# Patient Record
Sex: Male | Born: 1940 | Race: White | Hispanic: No | Marital: Married | Smoking: Former smoker
Health system: Southern US, Community
[De-identification: ages and names within clinical notes are randomized; demographics above are authoritative.]

## PROBLEM LIST (undated history)

## (undated) DIAGNOSIS — I10 Essential (primary) hypertension: Secondary | ICD-10-CM

## (undated) DIAGNOSIS — I2510 Atherosclerotic heart disease of native coronary artery without angina pectoris: Secondary | ICD-10-CM

## (undated) DIAGNOSIS — A048 Other specified bacterial intestinal infections: Secondary | ICD-10-CM

## (undated) DIAGNOSIS — Z8719 Personal history of other diseases of the digestive system: Secondary | ICD-10-CM

## (undated) DIAGNOSIS — I4891 Unspecified atrial fibrillation: Secondary | ICD-10-CM

## (undated) DIAGNOSIS — N200 Calculus of kidney: Secondary | ICD-10-CM

## (undated) DIAGNOSIS — I9789 Other postprocedural complications and disorders of the circulatory system, not elsewhere classified: Secondary | ICD-10-CM

## (undated) DIAGNOSIS — E7800 Pure hypercholesterolemia, unspecified: Secondary | ICD-10-CM

## (undated) DIAGNOSIS — N183 Chronic kidney disease, stage 3 unspecified: Secondary | ICD-10-CM

## (undated) DIAGNOSIS — G510 Bell's palsy: Secondary | ICD-10-CM

## (undated) DIAGNOSIS — E782 Mixed hyperlipidemia: Secondary | ICD-10-CM

## (undated) DIAGNOSIS — Z9889 Other specified postprocedural states: Secondary | ICD-10-CM

## (undated) DIAGNOSIS — R112 Nausea with vomiting, unspecified: Secondary | ICD-10-CM

## (undated) DIAGNOSIS — Z87442 Personal history of urinary calculi: Secondary | ICD-10-CM

## (undated) HISTORY — DX: Other postprocedural complications and disorders of the circulatory system, not elsewhere classified: I97.89

## (undated) HISTORY — DX: Personal history of other diseases of the digestive system: Z87.19

## (undated) HISTORY — DX: Unspecified atrial fibrillation: I48.91

## (undated) HISTORY — DX: Atherosclerotic heart disease of native coronary artery without angina pectoris: I25.10

## (undated) HISTORY — DX: Mixed hyperlipidemia: E78.2

## (undated) HISTORY — DX: Bell's palsy: G51.0

## (undated) HISTORY — PX: APPENDECTOMY: SHX54

## (undated) HISTORY — DX: Other specified bacterial intestinal infections: A04.8

## (undated) HISTORY — DX: Calculus of kidney: N20.0

## (undated) HISTORY — DX: Chronic kidney disease, stage 3 unspecified: N18.30

## (undated) HISTORY — DX: Essential (primary) hypertension: I10

---
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
## 2012-01-08 ENCOUNTER — Encounter (INDEPENDENT_AMBULATORY_CARE_PROVIDER_SITE_OTHER): Payer: Self-pay

## 2012-01-08 ENCOUNTER — Encounter (INDEPENDENT_AMBULATORY_CARE_PROVIDER_SITE_OTHER): Payer: Self-pay | Admitting: *Deleted

## 2012-02-19 ENCOUNTER — Telehealth (INDEPENDENT_AMBULATORY_CARE_PROVIDER_SITE_OTHER): Payer: Self-pay | Admitting: *Deleted

## 2012-02-19 ENCOUNTER — Other Ambulatory Visit (INDEPENDENT_AMBULATORY_CARE_PROVIDER_SITE_OTHER): Payer: Self-pay | Admitting: *Deleted

## 2012-02-19 DIAGNOSIS — Z86010 Personal history of colonic polyps: Secondary | ICD-10-CM

## 2012-02-19 DIAGNOSIS — Z1211 Encounter for screening for malignant neoplasm of colon: Secondary | ICD-10-CM

## 2012-02-19 NOTE — Telephone Encounter (Signed)
Patient needs movi prep 

## 2012-02-21 MED ORDER — PEG-KCL-NACL-NASULF-NA ASC-C 100 G PO SOLR: 1.0000 | Freq: Once | ORAL | Status: DC

## 2012-04-08 ENCOUNTER — Telehealth (INDEPENDENT_AMBULATORY_CARE_PROVIDER_SITE_OTHER): Payer: Self-pay | Admitting: *Deleted

## 2012-04-08 NOTE — Telephone Encounter (Signed)
PCP/Requesting MD: vyas  Name & DOB: Carl Young [birth year]   Procedure: tcs  Reason/Indication:  Hx polyps  Has patient had this procedure before?  yes  If so, when, by whom and where?  05/2007  Is there a family history of colon cancer?  no  Who?  What age when diagnosed?    Is patient diabetic?   no      Does patient have prosthetic heart valve?  no  Do you have a pacemaker?  no  Has patient had joint replacement within last 12 months?  no  Is patient on Coumadin, Plavix and/or Aspirin? yes  Medications: asa 81 mg daily, simvastatin 40 mg daily, benazepril 20/12.5 mg bid, gemfibrozil 300 mg bid  Allergies: nkda  Medication Adjustment: asa 2 days  Procedure date & time: 05/06/12 at 730

## 2012-04-09 NOTE — Telephone Encounter (Signed)
agree

## 2012-04-28 ENCOUNTER — Encounter (INDEPENDENT_AMBULATORY_CARE_PROVIDER_SITE_OTHER): Payer: Self-pay | Admitting: *Deleted

## 2012-04-28 MED ORDER — TETRACAINE HCL 0.5 % OP SOLN
OPHTHALMIC | Status: AC
  Filled 2012-04-28: qty 2

## 2012-04-28 MED ORDER — CYCLOPENTOLATE-PHENYLEPHRINE 0.2-1 % OP SOLN
OPHTHALMIC | Status: AC
  Filled 2012-04-28: qty 2

## 2012-04-29 MED ORDER — LIDOCAINE HCL (PF) 1 % IJ SOLN
INTRAMUSCULAR | Status: AC
  Filled 2012-04-29: qty 5

## 2012-04-29 MED ORDER — PROPOFOL 10 MG/ML IV EMUL
INTRAVENOUS | Status: AC
  Filled 2012-04-29: qty 20

## 2012-04-29 MED ORDER — FENTANYL CITRATE 0.05 MG/ML IJ SOLN
INTRAMUSCULAR | Status: AC
  Filled 2012-04-29: qty 5

## 2012-05-04 ENCOUNTER — Encounter (HOSPITAL_COMMUNITY): Payer: Self-pay | Admitting: Pharmacy Technician

## 2012-05-06 ENCOUNTER — Encounter (HOSPITAL_COMMUNITY): Payer: Self-pay | Admitting: *Deleted

## 2012-05-06 ENCOUNTER — Encounter (HOSPITAL_COMMUNITY)
Admission: RE | Disposition: A | Discharge status: Home / Self Care | Payer: Self-pay | Source: Ambulatory Visit | Attending: Internal Medicine

## 2012-05-06 ENCOUNTER — Ambulatory Visit (HOSPITAL_COMMUNITY)
Admission: RE | Admit: 2012-05-06 | Discharge: 2012-05-06 | Disposition: A | Discharge status: Home / Self Care | Payer: Medicare Other | Source: Ambulatory Visit | Attending: Internal Medicine | Admitting: Internal Medicine

## 2012-05-06 DIAGNOSIS — Z860100 Personal history of colon polyps, unspecified: Secondary | ICD-10-CM

## 2012-05-06 DIAGNOSIS — D126 Benign neoplasm of colon, unspecified: Secondary | ICD-10-CM | POA: Insufficient documentation

## 2012-05-06 DIAGNOSIS — K5730 Diverticulosis of large intestine without perforation or abscess without bleeding: Secondary | ICD-10-CM

## 2012-05-06 HISTORY — PX: COLONOSCOPY: SHX5424

## 2012-05-06 SURGERY — COLONOSCOPY
Anesthesia: Moderate Sedation

## 2012-05-06 MED ORDER — MEPERIDINE HCL 50 MG/ML IJ SOLN
INTRAMUSCULAR | Status: AC
  Filled 2012-05-06: qty 1

## 2012-05-06 MED ORDER — MEPERIDINE HCL 50 MG/ML IJ SOLN
INTRAMUSCULAR | Status: DC | PRN
  Administered 2012-05-06 (×2): 25 mg via INTRAVENOUS

## 2012-05-06 MED ORDER — MIDAZOLAM HCL 5 MG/5ML IJ SOLN
INTRAMUSCULAR | Status: DC | PRN
  Administered 2012-05-06: 2 mg via INTRAVENOUS
  Administered 2012-05-06: 1 mg via INTRAVENOUS

## 2012-05-06 MED ORDER — STERILE WATER FOR IRRIGATION IR SOLN
Status: DC | PRN
  Administered 2012-05-06: 08:00:00

## 2012-05-06 MED ORDER — MIDAZOLAM HCL 5 MG/5ML IJ SOLN
INTRAMUSCULAR | Status: AC
  Filled 2012-05-06: qty 10

## 2012-05-06 MED ORDER — SODIUM CHLORIDE 0.45 % IV SOLN
INTRAVENOUS | Status: DC
  Administered 2012-05-06: 08:00:00 via INTRAVENOUS

## 2012-05-06 NOTE — H&P (Signed)
Carl Young is an 71 y.o. male.   Chief Complaint: Patient is here for colonoscopy. HPI: Patient is 71 year old Caucasian male with history of colonic adenomas who is in for surveillance colonoscopy. His last exam was in November 2000 and removal of 2 small tubular adenomas. He denies abdominal pain change in his bowel habits or rectal bleeding. Family history is negative for colorectal carcinoma.  History reviewed. No pertinent past medical history.  Past Surgical History  Procedure Date  . Appendectomy     History reviewed. No pertinent family history. Social History:  does not have a smoking history on file. He does not have any smokeless tobacco history on file. He reports that he drinks alcohol. He reports that he does not use illicit drugs.  Allergies: No Known Allergies  Medications Prior to Admission  Medication Sig Dispense Refill  . aspirin EC 81 MG tablet Take 81 mg by mouth daily.      . benazepril-hydrochlorthiazide (LOTENSIN HCT) 20-12.5 MG per tablet Take 1 tablet by mouth 2 (two) times daily.      . Coenzyme Q10 (CO Q 10) 100 MG CAPS Take 1 tablet by mouth daily.      . fish oil-omega-3 fatty acids 1000 MG capsule Take 1 g by mouth 2 (two) times daily.      . folic acid (FOLVITE) 800 MCG tablet Take 800 mcg by mouth daily.      . Garlic 1000 MG CAPS Take 1,000 mg by mouth daily.      Marland Kitchen gemfibrozil (LOPID) 600 MG tablet Take 600 mg by mouth 2 (two) times daily.       Marland Kitchen Lysine 500 MG CAPS Take 500 mg by mouth daily.      . Multiple Vitamin (MULTIVITAMIN WITH MINERALS) TABS Take 1 tablet by mouth daily.      . peg 3350 powder (MOVIPREP) 100 G SOLR Take 1 kit (100 g total) by mouth once.  1 kit  0  . simvastatin (ZOCOR) 40 MG tablet Take 40 mg by mouth every evening.      . zinc gluconate 50 MG tablet Take 50 mg by mouth daily.      . niacin (NIASPAN) 500 MG CR tablet Take 500 mg by mouth at bedtime.        No results found for this or any previous visit (from the  past 48 hour(s)). No results found.  ROS  Blood pressure 154/89, pulse 68, temperature 98.4 F (36.9 C), temperature source Oral, resp. rate 17, height 6' (1.829 m), weight 214 lb (97.07 kg), SpO2 92.00%. Physical Exam  Constitutional: He appears well-developed and well-nourished.  HENT:  Mouth/Throat: Oropharynx is clear and moist.  Eyes: Conjunctivae normal are normal. No scleral icterus.  Neck: No thyromegaly present.  Cardiovascular: Normal rate, regular rhythm and normal heart sounds.   No murmur heard. Respiratory: Effort normal and breath sounds normal.  GI: Soft. He exhibits no distension and no mass. There is no tenderness.       Small umbilicus hernia which is completely reducible  Musculoskeletal: He exhibits no edema.  Lymphadenopathy:    He has no cervical adenopathy.  Neurological: He is alert.  Skin: Skin is warm and dry.     Assessment/Plan History of colonic adenomas. Surveillance colonoscopy.  Carl Young U 05/06/2012, 7:39 AM

## 2012-05-06 NOTE — Op Note (Signed)
COLONOSCOPY PROCEDURE REPORT  PATIENT:  Carl Young  MR#:  [id number] Birthdate:  1941-06-24, 71 y.o., male Endoscopist:  Dr. Malissa Hippo, MD Referred By:  Dub Mikes, MD Procedure Date: 05/06/2012  Procedure:   Colonoscopy  Indications:  Patient is 71 year old Caucasian male with history of colonic adenomas who is undergoing surveillance colonoscopy.  Informed Consent:  The procedure and risks were reviewed with the patient and informed consent was obtained.  Medications:  Demerol 50 mg IV Versed 3 mg IV  Description of procedure:  After a digital rectal exam was performed, that colonoscope was advanced from the anus through the rectum and colon to the area of the cecum, ileocecal valve and appendiceal orifice. The cecum was deeply intubated. These structures were well-seen and photographed for the record. From the level of the cecum and ileocecal valve, the scope was slowly and cautiously withdrawn. The mucosal surfaces were carefully surveyed utilizing scope tip to flexion to facilitate fold flattening as needed. The scope was pulled down into the rectum where a thorough exam including retroflexion was performed.  Findings:   Prep excellent. Scattered diverticula at sigmoid colon. Three small polyps at sigmoid colon ablated via cold biopsy and submitted together. Normal rectal mucosa and anal rectal junction.  Therapeutic/Diagnostic Maneuvers Performed:  The above  Complications:  None  Cecal Withdrawal Time:  17 minutes  Impression:  Examination performed to cecum. Mild to moderate sigmoid diverticulosis. Three small polyps ablated via cold biopsy from sigmoid colon and submitted together.  Recommendations:  Standard instructions given. I would be contacting patient with biopsy results and further recommendations.  Neriah Brott U  05/06/2012 8:10 AM  CC: Dr. Ignatius Specking., MD & Dr. Bonnetta Barry ref. provider found

## 2012-05-11 ENCOUNTER — Encounter (HOSPITAL_COMMUNITY): Payer: Self-pay | Admitting: Internal Medicine

## 2012-05-12 ENCOUNTER — Encounter (INDEPENDENT_AMBULATORY_CARE_PROVIDER_SITE_OTHER): Payer: Self-pay | Admitting: *Deleted

## 2015-03-27 ENCOUNTER — Emergency Department (HOSPITAL_COMMUNITY): Payer: Medicare Other

## 2015-03-27 ENCOUNTER — Emergency Department (HOSPITAL_COMMUNITY)
Admission: EM | Admit: 2015-03-27 | Discharge: 2015-03-27 | Disposition: A | Discharge status: Home / Self Care | Payer: Medicare Other | Attending: Emergency Medicine | Admitting: Emergency Medicine

## 2015-03-27 ENCOUNTER — Telehealth: Payer: Self-pay | Admitting: Internal Medicine

## 2015-03-27 ENCOUNTER — Encounter (HOSPITAL_COMMUNITY): Payer: Self-pay | Admitting: Emergency Medicine

## 2015-03-27 DIAGNOSIS — I10 Essential (primary) hypertension: Secondary | ICD-10-CM | POA: Diagnosis not present

## 2015-03-27 DIAGNOSIS — Z79899 Other long term (current) drug therapy: Secondary | ICD-10-CM | POA: Insufficient documentation

## 2015-03-27 DIAGNOSIS — K625 Hemorrhage of anus and rectum: Secondary | ICD-10-CM | POA: Diagnosis present

## 2015-03-27 DIAGNOSIS — K529 Noninfective gastroenteritis and colitis, unspecified: Secondary | ICD-10-CM | POA: Insufficient documentation

## 2015-03-27 DIAGNOSIS — Z87891 Personal history of nicotine dependence: Secondary | ICD-10-CM | POA: Insufficient documentation

## 2015-03-27 DIAGNOSIS — E780 Pure hypercholesterolemia: Secondary | ICD-10-CM | POA: Insufficient documentation

## 2015-03-27 DIAGNOSIS — Z7982 Long term (current) use of aspirin: Secondary | ICD-10-CM | POA: Insufficient documentation

## 2015-03-27 HISTORY — DX: Essential (primary) hypertension: I10

## 2015-03-27 HISTORY — DX: Pure hypercholesterolemia, unspecified: E78.00

## 2015-03-27 LAB — CBC
HEMATOCRIT: 44.3 % (ref 39.0–52.0)
Hemoglobin: 15.6 g/dL (ref 13.0–17.0)
MCH: 33.1 pg (ref 26.0–34.0)
MCHC: 35.2 g/dL (ref 30.0–36.0)
MCV: 94.1 fL (ref 78.0–100.0)
Platelets: 165 10*3/uL (ref 150–400)
RBC: 4.71 MIL/uL (ref 4.22–5.81)
RDW: 14.4 % (ref 11.5–15.5)
WBC: 14.8 10*3/uL — AB (ref 4.0–10.5)

## 2015-03-27 LAB — COMPREHENSIVE METABOLIC PANEL
ALT: 23 U/L (ref 17–63)
AST: 24 U/L (ref 15–41)
Albumin: 4.9 g/dL (ref 3.5–5.0)
Alkaline Phosphatase: 46 U/L (ref 38–126)
Anion gap: 10 (ref 5–15)
BUN: 24 mg/dL — ABNORMAL HIGH (ref 6–20)
CHLORIDE: 100 mmol/L — AB (ref 101–111)
CO2: 28 mmol/L (ref 22–32)
Calcium: 9.5 mg/dL (ref 8.9–10.3)
Creatinine, Ser: 1.24 mg/dL (ref 0.61–1.24)
GFR, EST NON AFRICAN AMERICAN: 55 mL/min — AB (ref >60)
Glucose, Bld: 105 mg/dL — ABNORMAL HIGH (ref 65–99)
POTASSIUM: 3.7 mmol/L (ref 3.5–5.1)
SODIUM: 138 mmol/L (ref 135–145)
Total Bilirubin: 0.8 mg/dL (ref 0.3–1.2)
Total Protein: 8.6 g/dL — ABNORMAL HIGH (ref 6.5–8.1)

## 2015-03-27 LAB — URINALYSIS, ROUTINE W REFLEX MICROSCOPIC
Bilirubin Urine: NEGATIVE
Glucose, UA: NEGATIVE mg/dL
Ketones, ur: NEGATIVE mg/dL
LEUKOCYTES UA: NEGATIVE
NITRITE: NEGATIVE
PROTEIN: NEGATIVE mg/dL
Specific Gravity, Urine: 1.015 (ref 1.005–1.030)
UROBILINOGEN UA: 0.2 mg/dL (ref 0.0–1.0)
pH: 6 (ref 5.0–8.0)

## 2015-03-27 LAB — URINE MICROSCOPIC-ADD ON

## 2015-03-27 LAB — POC OCCULT BLOOD, ED: Fecal Occult Bld: POSITIVE — AB

## 2015-03-27 LAB — LIPASE, BLOOD: Lipase: 42 U/L (ref 22–51)

## 2015-03-27 LAB — I-STAT CG4 LACTIC ACID, ED: LACTIC ACID, VENOUS: 1.13 mmol/L (ref 0.5–2.0)

## 2015-03-27 MED ORDER — CIPROFLOXACIN IN D5W 400 MG/200ML IV SOLN
400.0000 mg | Freq: Once | INTRAVENOUS | Status: AC
  Administered 2015-03-27: 400 mg via INTRAVENOUS
  Filled 2015-03-27: qty 200

## 2015-03-27 MED ORDER — METRONIDAZOLE 500 MG PO TABS: 500.0000 mg | ORAL_TABLET | Freq: Two times a day (BID) | ORAL | Status: DC

## 2015-03-27 MED ORDER — METRONIDAZOLE IN NACL 5-0.79 MG/ML-% IV SOLN
500.0000 mg | Freq: Once | INTRAVENOUS | Status: AC
  Administered 2015-03-27: 500 mg via INTRAVENOUS
  Filled 2015-03-27: qty 100

## 2015-03-27 MED ORDER — CIPROFLOXACIN HCL 500 MG PO TABS: 500.0000 mg | ORAL_TABLET | Freq: Two times a day (BID) | ORAL | Status: DC

## 2015-03-27 MED ORDER — IOHEXOL 300 MG/ML  SOLN
25.0000 mL | Freq: Once | INTRAMUSCULAR | Status: AC | PRN
  Administered 2015-03-27: 25 mL via ORAL

## 2015-03-27 MED ORDER — SODIUM CHLORIDE 0.9 % IV BOLUS (SEPSIS)
1000.0000 mL | Freq: Once | INTRAVENOUS | Status: AC
  Administered 2015-03-27: 1000 mL via INTRAVENOUS

## 2015-03-27 MED ORDER — IOHEXOL 300 MG/ML  SOLN
100.0000 mL | Freq: Once | INTRAMUSCULAR | Status: AC | PRN
  Administered 2015-03-27: 100 mL via INTRAVENOUS

## 2015-03-27 MED ORDER — AMOXICILLIN-POT CLAVULANATE 875-125 MG PO TABS: 1.0000 | ORAL_TABLET | Freq: Two times a day (BID) | ORAL | Status: DC

## 2015-03-27 NOTE — ED Provider Notes (Signed)
CSN: [id number]     Arrival date & time 03/27/15  1032 History  This chart was scribed for Ezequiel Essex, MD by Eustaquio Maize, ED Scribe. This patient was seen in room APA07/[facility]07 and the patient's care was started at 10:48 AM.   Chief Complaint  Patient presents with  . Rectal Bleeding   The history is provided by the patient. No language interpreter was used.     HPI Comments: Carl Young is a 74 y.o. male who presents to the Emergency Department complaining of rectal bleeding that began this morning. Pt reports having intermittent, cramping abdominal pain with mild diarrhea 3 days ago. Pt had 2 episodes of diarrhea 2 days ago. He mentions upon having a bowel movement this morning he noticed blood in the water, blood upon wiping, and pinkish color to his stools. Pt went to see his PCP, Dr. Woody Seller, this morning and was sent here for further evaluation. Pt denies having anymore abdominal pain but states it is still sore. He also complains of urinary frequency. Pt has never had symptoms like this in the past. He takes 81 mg aspirin every morning but pt is not on anticoagulants. Denies vomiting, fever, chest pain, shortness of breath, dizziness, lightheadedness, or any other associated symptoms. Pt has been eating and drinking normally. No hx hemorrhoids. PSHx appendectomy. Pt's last colonoscopy was 3 years ago with findings of colon polyps.   Past Medical History  Diagnosis Date  . Hypertension   . Hypercholesteremia    Past Surgical History  Procedure Laterality Date  . Appendectomy    . Colonoscopy  05/06/2012    Procedure: COLONOSCOPY;  Surgeon: Rogene Houston, MD;  Location: [facility];  Service: Endoscopy;  Laterality: N/A;  730   History reviewed. No pertinent family history. Social History  Substance Use Topics  . Smoking status: Former Research scientist (life sciences)  . Smokeless tobacco: None  . Alcohol Use: Yes     Comment: occas    Review of Systems  A complete 10 system review of systems  was obtained and all systems are negative except as noted in the HPI and PMH.    Allergies  Ciprofloxacin in d5w  Home Medications   Prior to Admission medications   Medication Sig Start Date End Date Taking? Authorizing Provider  aspirin EC 81 MG tablet Take 81 mg by mouth daily.   Yes Historical Provider, MD  benazepril-hydrochlorthiazide (LOTENSIN HCT) 20-12.5 MG per tablet Take 1 tablet by mouth 2 (two) times daily.   Yes Historical Provider, MD  fish oil-omega-3 fatty acids 1000 MG capsule Take 1 g by mouth 2 (two) times daily.   Yes Historical Provider, MD  folic acid (FOLVITE) 201 MCG tablet Take 800 mcg by mouth daily.   Yes Historical Provider, MD  Garlic 0071 MG CAPS Take 1,000 mg by mouth daily.   Yes Historical Provider, MD  gemfibrozil (LOPID) 600 MG tablet Take 600 mg by mouth 2 (two) times daily.    Yes Historical Provider, MD  Lysine 500 MG CAPS Take 500 mg by mouth daily.   Yes Historical Provider, MD  Multiple Vitamin (MULTIVITAMIN WITH MINERALS) TABS Take 1 tablet by mouth daily.   Yes Historical Provider, MD  niacin (NIASPAN) 500 MG CR tablet Take 500 mg by mouth at bedtime.   Yes Historical Provider, MD  simvastatin (ZOCOR) 10 MG tablet Take 10 mg by mouth daily.   Yes Historical Provider, MD  zinc gluconate 50 MG tablet Take 50 mg by  mouth daily.   Yes Historical Provider, MD  amoxicillin-clavulanate (AUGMENTIN) 875-125 MG per tablet Take 1 tablet by mouth every 12 (twelve) hours. 03/27/15   Ezequiel Essex, MD   Triage Vitals: BP 141/86 mmHg  Pulse 86  Temp(Src) 98.4 F (36.9 C) (Oral)  Resp 18  Ht 5\' 11"  (1.803 m)  Wt 195 lb (88.451 kg)  BMI 27.21 kg/m2  SpO2 97%   Physical Exam  Constitutional: He is oriented to person, place, and time. He appears well-developed and well-nourished. No distress.  HENT:  Head: Normocephalic and atraumatic.  Mouth/Throat: Oropharynx is clear and moist. No oropharyngeal exudate.  Eyes: Conjunctivae and EOM are normal. Pupils  are equal, round, and reactive to light.  Neck: Normal range of motion. Neck supple.  No meningismus.  Cardiovascular: Normal rate, regular rhythm, normal heart sounds and intact distal pulses.   No murmur heard. Pulmonary/Chest: Effort normal and breath sounds normal. No respiratory distress.  Abdominal: Soft. There is tenderness. There is no rebound and no guarding.  Mild LLQ tenderness  Genitourinary:  No hemorrhoids. No gross blood. No stool obtained.   Musculoskeletal: Normal range of motion. He exhibits no edema or tenderness.  Neurological: He is alert and oriented to person, place, and time. No cranial nerve deficit. He exhibits normal muscle tone. Coordination normal.  No ataxia on finger to nose bilaterally. No pronator drift. 5/5 strength throughout. CN 2-12 intact. Negative Romberg. Equal grip strength. Sensation intact. Gait is normal.   Skin: Skin is warm.  Psychiatric: He has a normal mood and affect. His behavior is normal.  Nursing note and vitals reviewed.   ED Course  Procedures (including critical care time)  DIAGNOSTIC STUDIES: Oxygen Saturation is 97% on RA, normal by my interpretation.    COORDINATION OF CARE: 10:56 AM-Discussed treatment plan which includes CT A/P, UA, Lipase, Occult blood, CMP, CBC with pt at bedside and pt agreed to plan.   Labs Review Labs Reviewed  COMPREHENSIVE METABOLIC PANEL - Abnormal; Notable for the following:    Chloride 100 (*)    Glucose, Bld 105 (*)    BUN 24 (*)    Total Protein 8.6 (*)    GFR calc non Af Amer 55 (*)    All other components within normal limits  CBC - Abnormal; Notable for the following:    WBC 14.8 (*)    All other components within normal limits  URINALYSIS, ROUTINE W REFLEX MICROSCOPIC (NOT AT [facility]) - Abnormal; Notable for the following:    Hgb urine dipstick SMALL (*)    All other components within normal limits  URINE MICROSCOPIC-ADD ON - Abnormal; Notable for the following:    Bacteria, UA FEW  (*)    All other components within normal limits  POC OCCULT BLOOD, ED - Abnormal; Notable for the following:    Fecal Occult Bld POSITIVE (*)    All other components within normal limits  LIPASE, BLOOD  I-STAT CG4 LACTIC ACID, ED    Imaging Review Ct Abdomen Pelvis W Contrast  03/27/2015   CLINICAL DATA:  Acute generalized abdominal pain, diarrhea, rectal bleeding.  EXAM: CT ABDOMEN AND PELVIS WITH CONTRAST  TECHNIQUE: Multidetector CT imaging of the abdomen and pelvis was performed using the standard protocol following bolus administration of intravenous contrast.  CONTRAST:  36mL OMNIPAQUE IOHEXOL 300 MG/ML SOLN, 120mL OMNIPAQUE IOHEXOL 300 MG/ML SOLN  COMPARISON:  None.  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 Visualized lung bases appear  normal.  No gallstones are noted. The liver, spleen and pancreas appear unremarkable. Adrenal glands appear normal. No hydronephrosis or renal obstruction is noted. Left renal cyst is noted. There is no evidence of bowel obstruction. Atherosclerosis of abdominal aorta is noted without aneurysm formation. Mild wall thickening with minimal surrounding inflammatory changes are seen involving the proximal descending colon. Sigmoid diverticulosis is noted without inflammation. Urinary bladder appears normal. No significant adenopathy is noted. Status post appendectomy.  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  Sigmoid diverticulosis is noted without inflammation.  Probable mild colitis seen involving the proximal descending colon. This most likely is inflammatory or infectious in origin.   Electronically Signed   By: Marijo Conception, M.D.   On: 03/27/2015 12:29   I have personally reviewed and evaluated these images and lab results as part of my medical decision-making.   EKG Interpretation None      MDM   Final diagnoses:  Colitis  Rectal bleeding   patient with lower abdominal  pain, diarrhea with rectal bleeding today. No fever. While stable. No distress.  Orthostatics positive by heart rate, IV fluids given. Hemoglobin is stable. CT with sigmoid colitis and diverticulosis.  Appears comfortable, lactate normal. Doubt ischemic colitis.  Discussed with Dr. Gala Romney. He agrees patient appears stable for outpatient treatment. We'll prescribe Cipro and Flagyl follow-up in the GI clinic tomorrow.  Results discussed with patient. He wishes to go home. Admission offered for IV fluids and antibiotics which he declines. He appears quite well and nontoxic. He is given IV fluids for his positive orthostasis. No further diarrhea in the ED.  Dr. Gala Romney states he will arrange patient follow-up in the GI office tomorrow.  Patient developed erythema near his IV site with itching during Cipro infusion. IV still patent. Cipro stopped and added medication allergy list. Antibiotic prescription switched to Augmentin.  Blood pressure 134/81, pulse 70, temperature 97.8 F (36.6 C), temperature source Oral, resp. rate 13, height 5\' 11"  (1.803 m), weight 195 lb (88.451 kg), SpO2 97 %.   I personally performed the services described in this documentation, which was scribed in my presence. The recorded information has been reviewed and is accurate.     Ezequiel Essex, MD 03/27/15 [phone]

## 2015-03-27 NOTE — Discharge Instructions (Signed)
Colitis Follow up with Dr. Laural Golden tomorrow. Call his office if he doesn't call you. Return to the ED if you develop worsening pain, rectal bleeding, vomiting, dizziness or any other concerns. Colitis is inflammation of the colon. Colitis can be a short-term or long-standing (chronic) illness. Crohn's disease and ulcerative colitis are 2 types of colitis which are chronic. They usually require lifelong treatment. CAUSES  There are many different causes of colitis, including:  Viruses.  Germs (bacteria).  Medicine reactions. SYMPTOMS   Diarrhea.  Intestinal bleeding.  Pain.  Fever.  Throwing up (vomiting).  Tiredness (fatigue).  Weight loss.  Bowel blockage. DIAGNOSIS  The diagnosis of colitis is based on examination and stool or blood tests. X-rays, CT scan, and colonoscopy may also be needed. TREATMENT  Treatment may include:  Fluids given through the vein (intravenously).  Bowel rest (nothing to eat or drink for a period of time).  Medicine for pain and diarrhea.  Medicines (antibiotics) that kill germs.  Cortisone medicines.  Surgery. HOME CARE INSTRUCTIONS   Get plenty of rest.  Drink enough water and fluids to keep your urine clear or pale yellow.  Eat a well-balanced diet.  Call your caregiver for follow-up as recommended. SEEK IMMEDIATE MEDICAL CARE IF:   You develop chills.  You have an oral temperature above 102 F (38.9 C), not controlled by medicine.  You have extreme weakness, fainting, or dehydration.  You have repeated vomiting.  You develop severe belly (abdominal) pain or are passing bloody or tarry stools. MAKE SURE YOU:   Understand these instructions.  Will watch your condition.  Will get help right away if you are not doing well or get worse. Document Released: 07/25/2004 Document Revised: 09/09/2011 Document Reviewed: 10/20/2009 [facility] Patient Information 2015 [place], [place]. This information is not intended to replace  advice given to you by your health care provider. Make sure you discuss any questions you have with your health care provider.

## 2015-03-27 NOTE — Telephone Encounter (Signed)
Patient of Dr. Salley Slaughter; seen in ED today. Dr. Kingsley Callander called me. Abdominal pain and diarrhea. Left-sided colitis on CT. White count almost 15,000. Looks good for outpatient management according to Dr. Kingsley Callander.  It has been requested that  Dr. Salley Slaughter see him in the next 24-48 hours. Appointment with him needs to be made. Thanks.

## 2015-03-27 NOTE — ED Notes (Signed)
Occult blood negative

## 2015-03-27 NOTE — ED Notes (Signed)
Cipro stopped at this time, pt has redness and swelling proximal to IV site, pt c/o itching and burning.  Dr. Wyvonnia Dusky notified.

## 2015-03-27 NOTE — ED Notes (Signed)
PT stated lower abdominal cramping x2 days and denies any n/v/d. PT noticed dark and bright red blood with BM this am. PT sent to ED by PCP Dr. Woody Seller.

## 2015-03-28 ENCOUNTER — Ambulatory Visit (INDEPENDENT_AMBULATORY_CARE_PROVIDER_SITE_OTHER): Payer: Medicare Other | Admitting: Internal Medicine

## 2015-03-28 ENCOUNTER — Encounter (INDEPENDENT_AMBULATORY_CARE_PROVIDER_SITE_OTHER): Payer: Self-pay | Admitting: Internal Medicine

## 2015-03-28 VITALS — BP 106/70 | HR 72 | Temp 98.7°F | Ht 71.0 in | Wt 191.2 lb

## 2015-03-28 DIAGNOSIS — E7800 Pure hypercholesterolemia, unspecified: Secondary | ICD-10-CM

## 2015-03-28 DIAGNOSIS — I10 Essential (primary) hypertension: Secondary | ICD-10-CM

## 2015-03-28 DIAGNOSIS — Z86010 Personal history of colonic polyps: Secondary | ICD-10-CM

## 2015-03-28 DIAGNOSIS — K529 Noninfective gastroenteritis and colitis, unspecified: Secondary | ICD-10-CM

## 2015-03-28 DIAGNOSIS — R197 Diarrhea, unspecified: Secondary | ICD-10-CM

## 2015-03-28 NOTE — Progress Notes (Signed)
Subjective:    Patient ID: Carl Young, male    DOB: 02-Mar-1941, 74 y.o.   MRN: [id number]  HPI    Patient presents today with c/o that he was seen in the ED 03/27/2015 with abdominal pain, diarrhea, and rectal bleeding.  The rectal bleeding starting the morning he went to the ED. He also had abdominal cramping, and diarrhea x 3 days. He saw Dr. Woody Seller and was referred to the ED for further evaluation.  He underwent a CT which revealed probable mild colitis involving the proximal descending colon. Most likely is inflammatory or infectious in origin. There was no fever, nausea, or vomiting associated with his symptoms.  Talked with Dr. Gala Romney and patient could be managed on an oupt basis. He was started on Cipro and Flagyl in the ED. He apparently had reaction to the Cipro:(swelling, and redness to his vein). He was started on Augmentin. He tells me today he feels the same. He feels tired. He says he has not seen any blood since yesterday. His stools are loose.  He is eating jello, and egg sandwiches. Basically a bland diet.     03/27/2015 CT abdomen/pelvis with CM:  acute generalized abdominal pain, diarrhea, rectal bleeding.  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   Sigmoid diverticulosis is noted without inflammation.   Probable mild colitis seen involving the proximal descending colon. This most likely is inflammatory or infectious in origin.   05/06/2012   Colonoscopy  Indications:  Patient is 74 year old Caucasian male with history of colonic adenomas who is undergoing surveillance colonoscopy.  Impression:   Examination performed to cecum. Mild to moderate sigmoid diverticulosis. Three small polyps ablated via cold biopsy from sigmoid colon and submitted together. Biopsy:   3 small polyps were removed and they're hyperplastic. Family history is negative for CRC. He could wait 7 years prior to next exam unless he develops change in his  bowel habits or new symptoms  Report to PCP   CBC    Component Value Date/Time   WBC 14.8* 03/27/2015 1100   RBC 4.71 03/27/2015 1100   HGB 15.6 03/27/2015 1100   HCT 44.3 03/27/2015 1100   PLT 165 03/27/2015 1100   MCV 94.1 03/27/2015 1100   MCH 33.1 03/27/2015 1100   MCHC 35.2 03/27/2015 1100   RDW 14.4 03/27/2015 1100   Hepatic Function Panel     Component Value Date/Time   PROT 8.6* 03/27/2015 1100   ALBUMIN 4.9 03/27/2015 1100   AST 24 03/27/2015 1100   ALT 23 03/27/2015 1100   ALKPHOS 46 03/27/2015 1100   BILITOT 0.8 03/27/2015 1100   Urinalysis    Component Value Date/Time   COLORURINE YELLOW 03/27/2015 [place] 03/27/2015 1152   LABSPEC 1.015 03/27/2015 1152   PHURINE 6.0 03/27/2015 1152   GLUCOSEU NEGATIVE 03/27/2015 1152   HGBUR SMALL* 03/27/2015 [place] 03/27/2015 1152   KETONESUR NEGATIVE 03/27/2015 1152   PROTEINUR NEGATIVE 03/27/2015 1152   UROBILINOGEN 0.2 03/27/2015 1152   NITRITE NEGATIVE 03/27/2015 1152   LEUKOCYTESUR NEGATIVE 03/27/2015 1152         Review of Systems Past Medical History  Diagnosis Date  . Hypertension   . Hypercholesteremia     Past Surgical History  Procedure Laterality Date  . Appendectomy    . Colonoscopy  05/06/2012    Procedure: COLONOSCOPY;  Surgeon: Rogene Houston, MD;  Location: [facility];  Service: Endoscopy;  Laterality: N/A;  730    Allergies  Allergen Reactions  . Ciprofloxacin In D5w     Rash, pruritis     Current Outpatient Prescriptions on File Prior to Visit  Medication Sig Dispense Refill  . amoxicillin-clavulanate (AUGMENTIN) 875-125 MG per tablet Take 1 tablet by mouth every 12 (twelve) hours. 10 tablet 0  . aspirin EC 81 MG tablet Take 81 mg by mouth daily.    . benazepril-hydrochlorthiazide (LOTENSIN HCT) 20-12.5 MG per tablet Take 1 tablet by mouth 2 (two) times daily.    . fish oil-omega-3 fatty acids 1000 MG capsule Take 1 g by mouth 2 (two)  times daily.    . folic acid (FOLVITE) 488 MCG tablet Take 800 mcg by mouth daily.    . Garlic 8916 MG CAPS Take 1,000 mg by mouth daily.    Marland Kitchen Lysine 500 MG CAPS Take 500 mg by mouth daily.    . Multiple Vitamin (MULTIVITAMIN WITH MINERALS) TABS Take 1 tablet by mouth daily.    . niacin (NIASPAN) 500 MG CR tablet Take 500 mg by mouth at bedtime.    Marland Kitchen zinc gluconate 50 MG tablet Take 50 mg by mouth daily.     No current facility-administered medications on file prior to visit.        Objective:   Physical Exam Blood pressure 106/70, pulse 72, temperature 98.7 F (37.1 C), height 5\' 11"  (1.803 m), weight 191 lb 3.2 oz (86.728 kg).  Alert and oriented. Skin warm and dry. Oral mucosa is moist.   . Sclera anicteric, conjunctivae is pink. Thyroid not enlarged. No cervical lymphadenopathy. Lungs clear. Heart regular rate and rhythm.  Abdomen is soft. Bowel sounds are positive. No hepatomegaly. No abdominal masses felt. Slight  Tenderness to left abdomen. .  No edema to lower extremities.         Assessment & Plan:  ? Infectious colitis. He is taking Augmentin for this.  Am going to get GI Pathogen. If normal and he feels better, will watch. If not better he will need a sigmoidoscopy.

## 2015-03-28 NOTE — Patient Instructions (Addendum)
GI pathogen. Further recommendations to follow.  

## 2015-03-30 LAB — GASTROINTESTINAL PATHOGEN PANEL PCR
C. DIFFICILE TOX A/B, PCR: NEGATIVE
CAMPYLOBACTER, PCR: NEGATIVE
CRYPTOSPORIDIUM, PCR: NEGATIVE
E COLI 0157, PCR: NEGATIVE
E coli (ETEC) LT/ST PCR: NEGATIVE
E coli (STEC) stx1/stx2, PCR: NEGATIVE
Giardia lamblia, PCR: NEGATIVE
Norovirus, PCR: NEGATIVE
ROTAVIRUS, PCR: NEGATIVE
SALMONELLA, PCR: NEGATIVE
Shigella, PCR: NEGATIVE

## 2015-03-30 NOTE — Telephone Encounter (Signed)
Patient was seen in our office 2 days ago by Ms. Setzer NP.

## 2015-07-24 DIAGNOSIS — X32XXXD Exposure to sunlight, subsequent encounter: Secondary | ICD-10-CM | POA: Diagnosis not present

## 2015-07-24 DIAGNOSIS — L570 Actinic keratosis: Secondary | ICD-10-CM | POA: Diagnosis not present

## 2015-07-24 DIAGNOSIS — D235 Other benign neoplasm of skin of trunk: Secondary | ICD-10-CM | POA: Diagnosis not present

## 2015-07-24 DIAGNOSIS — D225 Melanocytic nevi of trunk: Secondary | ICD-10-CM | POA: Diagnosis not present

## 2015-07-24 DIAGNOSIS — D3617 Benign neoplasm of peripheral nerves and autonomic nervous system of trunk, unspecified: Secondary | ICD-10-CM | POA: Diagnosis not present

## 2015-07-24 DIAGNOSIS — D485 Neoplasm of uncertain behavior of skin: Secondary | ICD-10-CM | POA: Diagnosis not present

## 2015-07-24 DIAGNOSIS — L820 Inflamed seborrheic keratosis: Secondary | ICD-10-CM | POA: Diagnosis not present

## 2015-07-31 DIAGNOSIS — L089 Local infection of the skin and subcutaneous tissue, unspecified: Secondary | ICD-10-CM | POA: Diagnosis not present

## 2015-07-31 DIAGNOSIS — D485 Neoplasm of uncertain behavior of skin: Secondary | ICD-10-CM | POA: Diagnosis not present

## 2015-09-05 DIAGNOSIS — E782 Mixed hyperlipidemia: Secondary | ICD-10-CM | POA: Diagnosis not present

## 2015-09-05 DIAGNOSIS — Z713 Dietary counseling and surveillance: Secondary | ICD-10-CM | POA: Diagnosis not present

## 2015-09-05 DIAGNOSIS — K7689 Other specified diseases of liver: Secondary | ICD-10-CM | POA: Diagnosis not present

## 2015-09-05 DIAGNOSIS — Z789 Other specified health status: Secondary | ICD-10-CM | POA: Diagnosis not present

## 2015-09-05 DIAGNOSIS — Z6827 Body mass index (BMI) 27.0-27.9, adult: Secondary | ICD-10-CM | POA: Diagnosis not present

## 2015-10-27 DIAGNOSIS — E7800 Pure hypercholesterolemia, unspecified: Secondary | ICD-10-CM | POA: Diagnosis not present

## 2015-10-27 DIAGNOSIS — I10 Essential (primary) hypertension: Secondary | ICD-10-CM | POA: Diagnosis not present

## 2015-12-21 DIAGNOSIS — E7800 Pure hypercholesterolemia, unspecified: Secondary | ICD-10-CM | POA: Diagnosis not present

## 2015-12-21 DIAGNOSIS — I10 Essential (primary) hypertension: Secondary | ICD-10-CM | POA: Diagnosis not present

## 2016-01-09 DIAGNOSIS — Z299 Encounter for prophylactic measures, unspecified: Secondary | ICD-10-CM | POA: Diagnosis not present

## 2016-01-09 DIAGNOSIS — E782 Mixed hyperlipidemia: Secondary | ICD-10-CM | POA: Diagnosis not present

## 2016-01-09 DIAGNOSIS — I10 Essential (primary) hypertension: Secondary | ICD-10-CM | POA: Diagnosis not present

## 2016-01-29 DIAGNOSIS — L821 Other seborrheic keratosis: Secondary | ICD-10-CM | POA: Diagnosis not present

## 2016-01-29 DIAGNOSIS — Z1283 Encounter for screening for malignant neoplasm of skin: Secondary | ICD-10-CM | POA: Diagnosis not present

## 2016-02-23 DIAGNOSIS — E7800 Pure hypercholesterolemia, unspecified: Secondary | ICD-10-CM | POA: Diagnosis not present

## 2016-02-23 DIAGNOSIS — I10 Essential (primary) hypertension: Secondary | ICD-10-CM | POA: Diagnosis not present

## 2016-03-06 DIAGNOSIS — Z1211 Encounter for screening for malignant neoplasm of colon: Secondary | ICD-10-CM | POA: Diagnosis not present

## 2016-03-06 DIAGNOSIS — Z1389 Encounter for screening for other disorder: Secondary | ICD-10-CM | POA: Diagnosis not present

## 2016-03-06 DIAGNOSIS — Z79899 Other long term (current) drug therapy: Secondary | ICD-10-CM | POA: Diagnosis not present

## 2016-03-06 DIAGNOSIS — Z299 Encounter for prophylactic measures, unspecified: Secondary | ICD-10-CM | POA: Diagnosis not present

## 2016-03-06 DIAGNOSIS — Z7189 Other specified counseling: Secondary | ICD-10-CM | POA: Diagnosis not present

## 2016-03-06 DIAGNOSIS — E7800 Pure hypercholesterolemia, unspecified: Secondary | ICD-10-CM | POA: Diagnosis not present

## 2016-03-06 DIAGNOSIS — R5383 Other fatigue: Secondary | ICD-10-CM | POA: Diagnosis not present

## 2016-03-06 DIAGNOSIS — Z125 Encounter for screening for malignant neoplasm of prostate: Secondary | ICD-10-CM | POA: Diagnosis not present

## 2016-03-06 DIAGNOSIS — Z0000 Encounter for general adult medical examination without abnormal findings: Secondary | ICD-10-CM | POA: Diagnosis not present

## 2016-03-06 DIAGNOSIS — E0590 Thyrotoxicosis, unspecified without thyrotoxic crisis or storm: Secondary | ICD-10-CM | POA: Diagnosis not present

## 2016-03-25 DIAGNOSIS — I10 Essential (primary) hypertension: Secondary | ICD-10-CM | POA: Diagnosis not present

## 2016-03-25 DIAGNOSIS — E7800 Pure hypercholesterolemia, unspecified: Secondary | ICD-10-CM | POA: Diagnosis not present

## 2016-05-02 DIAGNOSIS — Z23 Encounter for immunization: Secondary | ICD-10-CM | POA: Diagnosis not present

## 2016-09-11 DIAGNOSIS — I10 Essential (primary) hypertension: Secondary | ICD-10-CM | POA: Diagnosis not present

## 2016-09-11 DIAGNOSIS — Z713 Dietary counseling and surveillance: Secondary | ICD-10-CM | POA: Diagnosis not present

## 2016-09-11 DIAGNOSIS — E782 Mixed hyperlipidemia: Secondary | ICD-10-CM | POA: Diagnosis not present

## 2016-09-11 DIAGNOSIS — Z6827 Body mass index (BMI) 27.0-27.9, adult: Secondary | ICD-10-CM | POA: Diagnosis not present

## 2016-09-11 DIAGNOSIS — K7689 Other specified diseases of liver: Secondary | ICD-10-CM | POA: Diagnosis not present

## 2016-09-11 DIAGNOSIS — Z299 Encounter for prophylactic measures, unspecified: Secondary | ICD-10-CM | POA: Diagnosis not present

## 2016-12-09 DIAGNOSIS — Z6827 Body mass index (BMI) 27.0-27.9, adult: Secondary | ICD-10-CM | POA: Diagnosis not present

## 2016-12-09 DIAGNOSIS — Z713 Dietary counseling and surveillance: Secondary | ICD-10-CM | POA: Diagnosis not present

## 2016-12-09 DIAGNOSIS — Z299 Encounter for prophylactic measures, unspecified: Secondary | ICD-10-CM | POA: Diagnosis not present

## 2016-12-09 DIAGNOSIS — E782 Mixed hyperlipidemia: Secondary | ICD-10-CM | POA: Diagnosis not present

## 2017-01-30 DIAGNOSIS — M2041 Other hammer toe(s) (acquired), right foot: Secondary | ICD-10-CM | POA: Diagnosis not present

## 2017-01-30 DIAGNOSIS — L97523 Non-pressure chronic ulcer of other part of left foot with necrosis of muscle: Secondary | ICD-10-CM | POA: Diagnosis not present

## 2017-01-30 DIAGNOSIS — L84 Corns and callosities: Secondary | ICD-10-CM | POA: Diagnosis not present

## 2017-01-30 DIAGNOSIS — M2042 Other hammer toe(s) (acquired), left foot: Secondary | ICD-10-CM | POA: Diagnosis not present

## 2017-02-06 DIAGNOSIS — L97523 Non-pressure chronic ulcer of other part of left foot with necrosis of muscle: Secondary | ICD-10-CM | POA: Diagnosis not present

## 2017-02-12 DIAGNOSIS — L97523 Non-pressure chronic ulcer of other part of left foot with necrosis of muscle: Secondary | ICD-10-CM | POA: Diagnosis not present

## 2017-02-12 DIAGNOSIS — G608 Other hereditary and idiopathic neuropathies: Secondary | ICD-10-CM | POA: Diagnosis not present

## 2017-02-12 DIAGNOSIS — L84 Corns and callosities: Secondary | ICD-10-CM | POA: Diagnosis not present

## 2017-02-21 DIAGNOSIS — M216X2 Other acquired deformities of left foot: Secondary | ICD-10-CM | POA: Diagnosis not present

## 2017-02-21 DIAGNOSIS — L97522 Non-pressure chronic ulcer of other part of left foot with fat layer exposed: Secondary | ICD-10-CM | POA: Diagnosis not present

## 2017-02-21 DIAGNOSIS — M2041 Other hammer toe(s) (acquired), right foot: Secondary | ICD-10-CM | POA: Diagnosis not present

## 2017-03-04 DIAGNOSIS — L97522 Non-pressure chronic ulcer of other part of left foot with fat layer exposed: Secondary | ICD-10-CM | POA: Diagnosis not present

## 2017-03-12 DIAGNOSIS — Z1389 Encounter for screening for other disorder: Secondary | ICD-10-CM | POA: Diagnosis not present

## 2017-03-12 DIAGNOSIS — Z7189 Other specified counseling: Secondary | ICD-10-CM | POA: Diagnosis not present

## 2017-03-12 DIAGNOSIS — Z125 Encounter for screening for malignant neoplasm of prostate: Secondary | ICD-10-CM | POA: Diagnosis not present

## 2017-03-12 DIAGNOSIS — E7800 Pure hypercholesterolemia, unspecified: Secondary | ICD-10-CM | POA: Diagnosis not present

## 2017-03-12 DIAGNOSIS — Z1211 Encounter for screening for malignant neoplasm of colon: Secondary | ICD-10-CM | POA: Diagnosis not present

## 2017-03-12 DIAGNOSIS — Z299 Encounter for prophylactic measures, unspecified: Secondary | ICD-10-CM | POA: Diagnosis not present

## 2017-03-12 DIAGNOSIS — R5383 Other fatigue: Secondary | ICD-10-CM | POA: Diagnosis not present

## 2017-03-12 DIAGNOSIS — Z6827 Body mass index (BMI) 27.0-27.9, adult: Secondary | ICD-10-CM | POA: Diagnosis not present

## 2017-03-12 DIAGNOSIS — E0590 Thyrotoxicosis, unspecified without thyrotoxic crisis or storm: Secondary | ICD-10-CM | POA: Diagnosis not present

## 2017-03-12 DIAGNOSIS — Z0000 Encounter for general adult medical examination without abnormal findings: Secondary | ICD-10-CM | POA: Diagnosis not present

## 2017-03-12 DIAGNOSIS — Z79899 Other long term (current) drug therapy: Secondary | ICD-10-CM | POA: Diagnosis not present

## 2017-03-18 DIAGNOSIS — L97522 Non-pressure chronic ulcer of other part of left foot with fat layer exposed: Secondary | ICD-10-CM | POA: Diagnosis not present

## 2017-03-20 DIAGNOSIS — H25013 Cortical age-related cataract, bilateral: Secondary | ICD-10-CM | POA: Diagnosis not present

## 2017-03-20 DIAGNOSIS — I10 Essential (primary) hypertension: Secondary | ICD-10-CM | POA: Diagnosis not present

## 2017-03-20 DIAGNOSIS — H35033 Hypertensive retinopathy, bilateral: Secondary | ICD-10-CM | POA: Diagnosis not present

## 2017-03-20 DIAGNOSIS — H35433 Paving stone degeneration of retina, bilateral: Secondary | ICD-10-CM | POA: Diagnosis not present

## 2017-03-20 DIAGNOSIS — Z87891 Personal history of nicotine dependence: Secondary | ICD-10-CM | POA: Diagnosis not present

## 2017-03-20 DIAGNOSIS — H3540 Unspecified peripheral retinal degeneration: Secondary | ICD-10-CM | POA: Diagnosis not present

## 2017-03-20 DIAGNOSIS — H532 Diplopia: Secondary | ICD-10-CM | POA: Diagnosis not present

## 2017-03-20 DIAGNOSIS — H25813 Combined forms of age-related cataract, bilateral: Secondary | ICD-10-CM | POA: Diagnosis not present

## 2017-03-20 DIAGNOSIS — H353112 Nonexudative age-related macular degeneration, right eye, intermediate dry stage: Secondary | ICD-10-CM | POA: Diagnosis not present

## 2017-03-20 DIAGNOSIS — Z299 Encounter for prophylactic measures, unspecified: Secondary | ICD-10-CM | POA: Diagnosis not present

## 2017-03-20 DIAGNOSIS — E782 Mixed hyperlipidemia: Secondary | ICD-10-CM | POA: Diagnosis not present

## 2017-03-20 DIAGNOSIS — H2513 Age-related nuclear cataract, bilateral: Secondary | ICD-10-CM | POA: Diagnosis not present

## 2017-03-20 DIAGNOSIS — E663 Overweight: Secondary | ICD-10-CM | POA: Diagnosis not present

## 2017-03-21 DIAGNOSIS — H4900 Third [oculomotor] nerve palsy, unspecified eye: Secondary | ICD-10-CM | POA: Diagnosis not present

## 2017-03-21 DIAGNOSIS — H353132 Nonexudative age-related macular degeneration, bilateral, intermediate dry stage: Secondary | ICD-10-CM | POA: Diagnosis not present

## 2017-03-21 DIAGNOSIS — H4901 Third [oculomotor] nerve palsy, right eye: Secondary | ICD-10-CM | POA: Diagnosis not present

## 2017-04-01 DIAGNOSIS — L97522 Non-pressure chronic ulcer of other part of left foot with fat layer exposed: Secondary | ICD-10-CM | POA: Diagnosis not present

## 2017-04-08 DIAGNOSIS — L97522 Non-pressure chronic ulcer of other part of left foot with fat layer exposed: Secondary | ICD-10-CM | POA: Diagnosis not present

## 2017-04-15 DIAGNOSIS — L97522 Non-pressure chronic ulcer of other part of left foot with fat layer exposed: Secondary | ICD-10-CM | POA: Diagnosis not present

## 2017-04-22 DIAGNOSIS — L97522 Non-pressure chronic ulcer of other part of left foot with fat layer exposed: Secondary | ICD-10-CM | POA: Diagnosis not present

## 2017-04-24 DIAGNOSIS — H4901 Third [oculomotor] nerve palsy, right eye: Secondary | ICD-10-CM | POA: Diagnosis not present

## 2017-04-24 DIAGNOSIS — I10 Essential (primary) hypertension: Secondary | ICD-10-CM | POA: Diagnosis not present

## 2017-04-24 DIAGNOSIS — E7800 Pure hypercholesterolemia, unspecified: Secondary | ICD-10-CM | POA: Diagnosis not present

## 2017-04-29 DIAGNOSIS — L97522 Non-pressure chronic ulcer of other part of left foot with fat layer exposed: Secondary | ICD-10-CM | POA: Diagnosis not present

## 2017-05-06 DIAGNOSIS — L97522 Non-pressure chronic ulcer of other part of left foot with fat layer exposed: Secondary | ICD-10-CM | POA: Diagnosis not present

## 2017-05-12 DIAGNOSIS — R748 Abnormal levels of other serum enzymes: Secondary | ICD-10-CM | POA: Diagnosis not present

## 2017-05-13 DIAGNOSIS — L97522 Non-pressure chronic ulcer of other part of left foot with fat layer exposed: Secondary | ICD-10-CM | POA: Diagnosis not present

## 2017-05-20 DIAGNOSIS — L97522 Non-pressure chronic ulcer of other part of left foot with fat layer exposed: Secondary | ICD-10-CM | POA: Diagnosis not present

## 2017-05-27 DIAGNOSIS — L97522 Non-pressure chronic ulcer of other part of left foot with fat layer exposed: Secondary | ICD-10-CM | POA: Diagnosis not present

## 2017-06-03 DIAGNOSIS — L97522 Non-pressure chronic ulcer of other part of left foot with fat layer exposed: Secondary | ICD-10-CM | POA: Diagnosis not present

## 2017-06-06 DIAGNOSIS — H4901 Third [oculomotor] nerve palsy, right eye: Secondary | ICD-10-CM | POA: Diagnosis not present

## 2017-06-12 DIAGNOSIS — L97522 Non-pressure chronic ulcer of other part of left foot with fat layer exposed: Secondary | ICD-10-CM | POA: Diagnosis not present

## 2017-06-20 DIAGNOSIS — M216X2 Other acquired deformities of left foot: Secondary | ICD-10-CM | POA: Diagnosis not present

## 2017-06-20 DIAGNOSIS — L97522 Non-pressure chronic ulcer of other part of left foot with fat layer exposed: Secondary | ICD-10-CM | POA: Diagnosis not present

## 2017-06-20 DIAGNOSIS — M2041 Other hammer toe(s) (acquired), right foot: Secondary | ICD-10-CM | POA: Diagnosis not present

## 2017-06-20 DIAGNOSIS — Z0189 Encounter for other specified special examinations: Secondary | ICD-10-CM | POA: Diagnosis not present

## 2017-06-27 ENCOUNTER — Other Ambulatory Visit: Payer: Self-pay | Admitting: Podiatry

## 2017-06-27 DIAGNOSIS — L97522 Non-pressure chronic ulcer of other part of left foot with fat layer exposed: Secondary | ICD-10-CM | POA: Diagnosis not present

## 2017-07-02 NOTE — Patient Instructions (Signed)
Carl Young  07/02/2017     @PREFPERIOPPHARMACY @   Your procedure is scheduled on  07/09/2017   Report to [facility] at  78   A.M.  Call this number if you have problems the morning of surgery:  [phone]   Remember:  Do not eat food or drink liquids after midnight.  Take these medicines the morning of surgery with A SIP OF WATER  Lotensin, claritin.   Do not wear jewelry, make-up or nail polish.  Do not wear lotions, powders, or perfumes, or deodorant.  Do not shave 48 hours prior to surgery.  Men may shave face and neck.  Do not bring valuables to the hospital.  [facility] is not responsible for any belongings or valuables.  Contacts, dentures or bridgework may not be worn into surgery.  Leave your suitcase in the car.  After surgery it may be brought to your room.  For patients admitted to the hospital, discharge time will be determined by your treatment team.  Patients discharged the day of surgery will not be allowed to drive home.   Name and phone number of your driver:   family Special instructions:  None  Please read over the following fact sheets that you were given. Anesthesia Post-op Instructions and Care and Recovery After Surgery       Metatarsal Osteotomy Metatarsal osteotomy is a surgical procedure to correct a toe dislocation or deformity. The surgery may also help to relieve foot pain. Your metatarsals are the five long bones that connect your toes to the rest of your foot. Osteotomy is a surgical cut into a bone to reshape and reposition the bone or joint. Tell a health care provider about:  Any allergies you have.  All medicines you are taking, including vitamins, herbs, eye drops, creams, and over-the-counter medicines.  Any problems you or family members have had with anesthetic medicines.  Any blood disorders you have.  Any surgeries you have had.  Any medical conditions you have. What are the risks? Generally,  this is a safe procedure. However, problems may occur, including:  Stiffness.  Pain.  Infection.  Bleeding.  Allergic reactions to medicines.  Nerve damage that causes numbness.  Failure of the osteotomy to heal.  A blood clot that forms in your leg and travels to your lungs (pulmonary embolism).  What happens before the procedure?  Your health care provider may order imaging tests of your foot.  Follow instructions from your health care provider about eating or drinking restrictions.  Ask your health care provider about: ? Changing or stopping your regular medicines. This is especially important if you are taking diabetes medicines or blood thinners. ? Taking medicines such as aspirin and ibuprofen. These medicines can thin your blood. Do not take these medicines before your procedure if your health care provider instructs you not to.  Plan to have someone take you home after the procedure.  Ask your health care provider how your surgical site will be marked or identified.  You may be given antibiotic medicine to help prevent infection. What happens during the procedure?  To reduce your risk of infection: ? Your health care team will wash or sanitize their hands. ? Your skin will be washed with soap. ? Hair may be removed from the surgical area.  An IV tube will be started in one of your veins.  You will be given one or more of the  following: ? A medicine to help you relax (sedative). ? A medicine to numb the area (local anesthetic). ? A medicine to make you fall asleep (general anesthetic). ? A medicine that is injected into your spine to numb the area below and slightly above the injection site (spinal anesthetic). ? A medicine that is injected into an area of your body to numb everything below the injection site (regional anesthetic).  A surgical cut (incision) will be made on your foot over the metatarsal bone that will be treated.  A cut will be made in the  bone to shorten or straighten the bone.  Metal pins, plates, or screws may be used to hold the bone in the right position.  The incision will be closed with stitches (sutures) or staples.  A bandage (dressing) will be placed around the front and bottom of your foot. The procedure may vary among health care providers and hospitals. What happens after the procedure?  Your blood pressure, heart rate, breathing rate, and blood oxygen level will be monitored often until the medicines you were given have worn off.  You may be given walking aids, such as: ? A custom-fitted hard-soled shoe that keeps weight on your heel. ? A walking boot. ? A splint. ? Crutches or a walker to help you walk without putting weight on your foot.  Do not drive for 24 hours if you received a sedative. This information is not intended to replace advice given to you by your health care provider. Make sure you discuss any questions you have with your health care provider. Document Released: 05/29/2015 Document Revised: 11/23/2015 Document Reviewed: 02/09/2015 Elsevier Interactive Patient Education  2018 [place].  Metatarsal Osteotomy, Care After Refer to this sheet in the next few weeks. These instructions provide you with information about caring for yourself after your procedure. Your health care provider may also give you more specific instructions. Your treatment has been planned according to current medical practices, but problems sometimes occur. Call your health care provider if you have any problems or questions after your procedure. What can I expect after the procedure? After the procedure, it is common to have:  Soreness.  Pain.  Stiffness.  Swelling.  Follow these instructions at home: If you have a splint:  Wear the splint as told by your health care provider. Remove it only as told by your health care provider.  Loosen the splint if your toes tingle, become numb, or turn cold and  blue.  Do not let your splint get wet if it is not waterproof.  Keep the splint clean. Bathing  Do not take baths, swim, or use a hot tub until your health care provider approves. Ask your health care provider if you can take showers. You may only be allowed to take sponge baths for bathing.  If your splint is not waterproof, cover it with a watertight plastic bag when you take a bath or a shower.  Keep the bandage (dressing) dry until your health care provider says it can be removed. Incision care  Follow instructions from your health care provider about how to take care of your cut from surgery (incision). Make sure you: ? Wash your hands with soap and water before you change your bandage (dressing). If soap and water are not available, use hand sanitizer. ? Change your dressing as told by your health care provider. ? Leave stitches (sutures), skin glue, or adhesive strips in place. These skin closures may need to stay in  place for 2 weeks or longer. If adhesive strip edges start to loosen and curl up, you may trim the loose edges. Do not remove adhesive strips completely unless your health care provider tells you to do that.  Check your incision area every day for signs of infection. Check for: ? More redness, swelling, or pain. ? More fluid or blood. ? Warmth. ? Pus or a bad smell. Managing pain, stiffness, and swelling   If directed, apply ice to the injured area. ? Put ice in a plastic bag. ? Place a towel between your skin and the bag. ? Leave the ice on for 20 minutes, 2-3 times a day.  Move your toes often to avoid stiffness and to lessen swelling.  Raise (elevate) the injured area above the level of your heart while you are sitting or lying down. Driving  Do not drive or operate heavy machinery while taking prescription pain medicine.  Do not drive for 24 hours if you received a sedative.  Ask your health care provider when it is safe to drive if you have a  dressing, splint, special shoe, or walking boot on your foot. General instructions  If you were given a splint, special shoe, or walking boot, wear it as told by your health care provider.  Return to your normal activities as told by your health care provider. Ask your health care provider what activities are safe for you.  Do not use the injured limb to support your body weight until your health care provider says that you can. Use crutches or a walker as told by your health care provider.  Do not use any tobacco products, such as cigarettes, chewing tobacco, and e-cigarettes. Tobacco can delay bone healing. If you need help quitting, ask your health care provider.  Take over-the-counter and prescription medicines only as told by your health care provider.  Keep all follow-up visits as told by your health care provider. This is important. Contact a health care provider if:  You have a fever.  Your dressing becomes wet, loose, or stained with blood or discharge.  You have pus or a bad smell coming from your incision or bandage.  Your foot becomes red, swollen, or tender.  You have pain or stiffness that does not get better or gets worse.  You have tingling or numbness in your foot that does not get better or gets worse. Get help right away if:  You develop a warm and tender swelling in your leg.  You have chest pain.  You have trouble breathing. This information is not intended to replace advice given to you by your health care provider. Make sure you discuss any questions you have with your health care provider. Document Released: 05/29/2015 Document Revised: 11/23/2015 Document Reviewed: 02/09/2015 Elsevier Interactive Patient Education  2018 [place] Toe Hammer toe is a change in the shape (a deformity) of your second, third, or fourth toe. The deformity causes the middle joint of your toe to stay bent. This causes pain, especially when you are wearing shoes.  Hammer toe starts gradually. At first, the toe can be straightened. Gradually over time, the deformity becomes stiff and permanent. Early treatments to keep the toe straight may relieve pain. As the deformity becomes stiff and permanent, surgery may be needed to straighten the toe. What are the causes? Hammer toe is caused by abnormal bending of the toe joint that is closest to your foot. It happens gradually over time. This pulls on  the muscles and connections (tendons) of the toe joint, making them weak and stiff. It is often related to wearing shoes that are too short or narrow and do not let your toes straighten. What increases the risk? You may be at greater risk for hammer toe if you:  Are male.  Are older.  Wear shoes that are too small.  Wear high-heeled shoes that pinch your toes.  Are a Engineer, mining.  Have a second toe that is longer than your big toe (first toe).  Injure your foot or toe.  Have arthritis.  Have a family history of hammer toe.  Have a nerve or muscle disorder.  What are the signs or symptoms? The main symptoms of this condition are pain and deformity of the toe. The pain is worse when wearing shoes, walking, or running. Other symptoms may include:  Corns or calluses over the bent part of the toe or between the toes.  Redness and a burning feeling on the toe.  An open sore that forms on the top of the toe.  Not being able to straighten the toe.  How is this diagnosed? This condition is diagnosed based on your symptoms and a physical exam. During the exam, your health care provider will try to straighten your toe to see how stiff the deformity is. You may also have tests, such as:  A blood test to check for rheumatoid arthritis.  An X-ray to show how severe the deformity is.  How is this treated? Treatment for this condition will depend on how stiff the deformity is. Surgery is often needed. However, sometimes a hammer toe can be straightened  without surgery. Treatments that do not involve surgery include:  Taping the toe into a straightened position.  Using pads and cushions to protect the toe (orthotics).  Wearing shoes that provide enough room for the toes.  Doing toe-stretching exercises at home.  Taking an NSAID to reduce pain and swelling.  If these treatments do not help or the toe cannot be straightened, surgery is the next option. The most common surgeries used to straighten a hammer toe include:  Arthroplasty. In this procedure, part of the joint is removed, and that allows the toe to straighten.  Fusion. In this procedure, cartilage between the two bones of the joint is taken out and the bones are fused together into one longer bone.  Implantation. In this procedure, part of the bone is removed and replaced with an implant to let the toe move again.  Flexor tendon transfer. In this procedure, the tendons that curl the toes down (flexor tendons) are repositioned.  Follow these instructions at home:  Take over-the-counter and prescription medicines only as told by your health care provider.  Do toe straightening and stretching exercises as told by your health care provider.  Keep all follow-up visits as told by your health care provider. This is important. How is this prevented?  Wear shoes that give your toes enough room and do not cause pain.  Do not wear high-heeled shoes. Contact a health care provider if:  Your pain gets worse.  Your toe becomes red or swollen.  You develop an open sore on your toe. This information is not intended to replace advice given to you by your health care provider. Make sure you discuss any questions you have with your health care provider. Document Released: 06/14/2000 Document Revised: 01/05/2016 Document Reviewed: 10/11/2015 Elsevier Interactive Patient Education  2018 [employer].  Monitored Anesthesia Care Anesthesia  is a term that refers to techniques,  procedures, and medicines that help a person stay safe and comfortable during a medical procedure. Monitored anesthesia care, or sedation, is one type of anesthesia. Your anesthesia specialist may recommend sedation if you will be having a procedure that does not require you to be unconscious, such as:  Cataract surgery.  A dental procedure.  A biopsy.  A colonoscopy.  During the procedure, you may receive a medicine to help you relax (sedative). There are three levels of sedation:  Mild sedation. At this level, you may feel awake and relaxed. You will be able to follow directions.  Moderate sedation. At this level, you will be sleepy. You may not remember the procedure.  Deep sedation. At this level, you will be asleep. You will not remember the procedure.  The more medicine you are given, the deeper your level of sedation will be. Depending on how you respond to the procedure, the anesthesia specialist may change your level of sedation or the type of anesthesia to fit your needs. An anesthesia specialist will monitor you closely during the procedure. Let your health care provider know about:  Any allergies you have.  All medicines you are taking, including vitamins, herbs, eye drops, creams, and over-the-counter medicines.  Any use of steroids (by mouth or as a cream).  Any problems you or family members have had with sedatives and anesthetic medicines.  Any blood disorders you have.  Any surgeries you have had.  Any medical conditions you have, such as sleep apnea.  Whether you are pregnant or may be pregnant.  Any use of cigarettes, alcohol, or street drugs. What are the risks? Generally, this is a safe procedure. However, problems may occur, including:  Getting too much medicine (oversedation).  Nausea.  Allergic reaction to medicines.  Trouble breathing. If this happens, a breathing tube may be used to help with breathing. It will be removed when you are awake and  breathing on your own.  Heart trouble.  Lung trouble.  Before the procedure Staying hydrated Follow instructions from your health care provider about hydration, which may include:  Up to 2 hours before the procedure - you may continue to drink clear liquids, such as water, clear fruit juice, black coffee, and plain tea.  Eating and drinking restrictions Follow instructions from your health care provider about eating and drinking, which may include:  8 hours before the procedure - stop eating heavy meals or foods such as meat, fried foods, or fatty foods.  6 hours before the procedure - stop eating light meals or foods, such as toast or cereal.  6 hours before the procedure - stop drinking milk or drinks that contain milk.  2 hours before the procedure - stop drinking clear liquids.  Medicines Ask your health care provider about:  Changing or stopping your regular medicines. This is especially important if you are taking diabetes medicines or blood thinners.  Taking medicines such as aspirin and ibuprofen. These medicines can thin your blood. Do not take these medicines before your procedure if your health care provider instructs you not to.  Tests and exams  You will have a physical exam.  You may have blood tests done to show: ? How well your kidneys and liver are working. ? How well your blood can clot.  General instructions  Plan to have someone take you home from the hospital or clinic.  If you will be going home right after the procedure, plan to  have someone with you for 24 hours.  What happens during the procedure?  Your blood pressure, heart rate, breathing, level of pain and overall condition will be monitored.  An IV tube will be inserted into one of your veins.  Your anesthesia specialist will give you medicines as needed to keep you comfortable during the procedure. This may mean changing the level of sedation.  The procedure will be performed. After  the procedure  Your blood pressure, heart rate, breathing rate, and blood oxygen level will be monitored until the medicines you were given have worn off.  Do not drive for 24 hours if you received a sedative.  You may: ? Feel sleepy, clumsy, or nauseous. ? Feel forgetful about what happened after the procedure. ? Have a sore throat if you had a breathing tube during the procedure. ? Vomit. This information is not intended to replace advice given to you by your health care provider. Make sure you discuss any questions you have with your health care provider. Document Released: 03/13/2005 Document Revised: 11/24/2015 Document Reviewed: 10/08/2015 Elsevier Interactive Patient Education  2018 [place], Care After These instructions provide you with information about caring for yourself after your procedure. Your health care provider may also give you more specific instructions. Your treatment has been planned according to current medical practices, but problems sometimes occur. Call your health care provider if you have any problems or questions after your procedure. What can I expect after the procedure? After your procedure, it is common to:  Feel sleepy for several hours.  Feel clumsy and have poor balance for several hours.  Feel forgetful about what happened after the procedure.  Have poor judgment for several hours.  Feel nauseous or vomit.  Have a sore throat if you had a breathing tube during the procedure.  Follow these instructions at home: For at least 24 hours after the procedure:   Do not: ? Participate in activities in which you could fall or become injured. ? Drive. ? Use heavy machinery. ? Drink alcohol. ? Take sleeping pills or medicines that cause drowsiness. ? Make important decisions or sign legal documents. ? Take care of children on your own.  Rest. Eating and drinking  Follow the diet that is recommended by your health  care provider.  If you vomit, drink water, juice, or soup when you can drink without vomiting.  Make sure you have little or no nausea before eating solid foods. General instructions  Have a responsible adult stay with you until you are awake and alert.  Take over-the-counter and prescription medicines only as told by your health care provider.  If you smoke, do not smoke without supervision.  Keep all follow-up visits as told by your health care provider. This is important. Contact a health care provider if:  You keep feeling nauseous or you keep vomiting.  You feel light-headed.  You develop a rash.  You have a fever. Get help right away if:  You have trouble breathing. This information is not intended to replace advice given to you by your health care provider. Make sure you discuss any questions you have with your health care provider. Document Released: 10/08/2015 Document Revised: 02/07/2016 Document Reviewed: 10/08/2015 Elsevier Interactive Patient Education  [employer].

## 2017-07-04 DIAGNOSIS — L97522 Non-pressure chronic ulcer of other part of left foot with fat layer exposed: Secondary | ICD-10-CM | POA: Diagnosis not present

## 2017-07-08 ENCOUNTER — Ambulatory Visit (HOSPITAL_COMMUNITY)
Admission: RE | Admit: 2017-07-08 | Discharge: 2017-07-08 | Disposition: A | Discharge status: Home / Self Care | Payer: Medicare Other | Source: Ambulatory Visit | Attending: Podiatry | Admitting: Podiatry

## 2017-07-08 ENCOUNTER — Encounter (HOSPITAL_COMMUNITY): Payer: Self-pay

## 2017-07-08 ENCOUNTER — Encounter (HOSPITAL_COMMUNITY)
Admission: RE | Admit: 2017-07-08 | Discharge: 2017-07-08 | Disposition: A | Discharge status: Home / Self Care | Payer: Medicare Other | Source: Ambulatory Visit | Attending: Podiatry | Admitting: Podiatry

## 2017-07-08 ENCOUNTER — Other Ambulatory Visit (HOSPITAL_COMMUNITY): Payer: Self-pay | Admitting: Podiatry

## 2017-07-08 ENCOUNTER — Other Ambulatory Visit: Payer: Self-pay

## 2017-07-08 DIAGNOSIS — M216X2 Other acquired deformities of left foot: Secondary | ICD-10-CM | POA: Diagnosis not present

## 2017-07-08 DIAGNOSIS — M2042 Other hammer toe(s) (acquired), left foot: Secondary | ICD-10-CM

## 2017-07-08 DIAGNOSIS — M19072 Primary osteoarthritis, left ankle and foot: Secondary | ICD-10-CM | POA: Diagnosis not present

## 2017-07-08 DIAGNOSIS — L97522 Non-pressure chronic ulcer of other part of left foot with fat layer exposed: Secondary | ICD-10-CM | POA: Diagnosis not present

## 2017-07-08 DIAGNOSIS — M2041 Other hammer toe(s) (acquired), right foot: Secondary | ICD-10-CM | POA: Diagnosis not present

## 2017-07-08 DIAGNOSIS — Z01810 Encounter for preprocedural cardiovascular examination: Secondary | ICD-10-CM | POA: Diagnosis not present

## 2017-07-08 HISTORY — DX: Nausea with vomiting, unspecified: R11.2

## 2017-07-08 HISTORY — DX: Other specified postprocedural states: Z98.890

## 2017-07-08 LAB — BASIC METABOLIC PANEL
ANION GAP: 9 (ref 5–15)
BUN: 29 mg/dL — ABNORMAL HIGH (ref 6–20)
CO2: 27 mmol/L (ref 22–32)
Calcium: 9.6 mg/dL (ref 8.9–10.3)
Chloride: 103 mmol/L (ref 101–111)
Creatinine, Ser: 1.32 mg/dL — ABNORMAL HIGH (ref 0.61–1.24)
GFR calc non Af Amer: 50 mL/min — ABNORMAL LOW (ref >60)
GFR, EST AFRICAN AMERICAN: 58 mL/min — AB (ref >60)
Glucose, Bld: 93 mg/dL (ref 65–99)
Potassium: 3.9 mmol/L (ref 3.5–5.1)
Sodium: 139 mmol/L (ref 135–145)

## 2017-07-08 LAB — CBC WITH DIFFERENTIAL/PLATELET
BASOS ABS: 0 10*3/uL (ref 0.0–0.1)
BASOS PCT: 0 %
Eosinophils Absolute: 0.1 10*3/uL (ref 0.0–0.7)
Eosinophils Relative: 1 %
HEMATOCRIT: 42.8 % (ref 39.0–52.0)
Hemoglobin: 13.8 g/dL (ref 13.0–17.0)
Lymphocytes Relative: 34 %
Lymphs Abs: 1.8 10*3/uL (ref 0.7–4.0)
MCH: 30.7 pg (ref 26.0–34.0)
MCHC: 32.2 g/dL (ref 30.0–36.0)
MCV: 95.1 fL (ref 78.0–100.0)
Monocytes Absolute: 0.6 10*3/uL (ref 0.1–1.0)
Monocytes Relative: 12 %
NEUTROS ABS: 2.8 10*3/uL (ref 1.7–7.7)
NEUTROS PCT: 53 %
Platelets: 170 10*3/uL (ref 150–400)
RBC: 4.5 MIL/uL (ref 4.22–5.81)
RDW: 14.5 % (ref 11.5–15.5)
WBC: 5.4 10*3/uL (ref 4.0–10.5)

## 2017-07-09 ENCOUNTER — Encounter (HOSPITAL_COMMUNITY)
Admission: RE | Disposition: A | Discharge status: Home / Self Care | Payer: Self-pay | Source: Ambulatory Visit | Attending: Podiatry

## 2017-07-09 ENCOUNTER — Ambulatory Visit (HOSPITAL_COMMUNITY): Payer: Medicare Other

## 2017-07-09 ENCOUNTER — Ambulatory Visit (HOSPITAL_COMMUNITY): Payer: Medicare Other | Admitting: Anesthesiology

## 2017-07-09 ENCOUNTER — Encounter (HOSPITAL_COMMUNITY): Payer: Self-pay | Admitting: *Deleted

## 2017-07-09 ENCOUNTER — Ambulatory Visit (HOSPITAL_COMMUNITY)
Admission: RE | Admit: 2017-07-09 | Discharge: 2017-07-09 | Disposition: A | Discharge status: Home / Self Care | Payer: Medicare Other | Source: Ambulatory Visit | Attending: Podiatry | Admitting: Podiatry

## 2017-07-09 DIAGNOSIS — I10 Essential (primary) hypertension: Secondary | ICD-10-CM | POA: Insufficient documentation

## 2017-07-09 DIAGNOSIS — Z87891 Personal history of nicotine dependence: Secondary | ICD-10-CM | POA: Insufficient documentation

## 2017-07-09 DIAGNOSIS — M2041 Other hammer toe(s) (acquired), right foot: Secondary | ICD-10-CM | POA: Diagnosis not present

## 2017-07-09 DIAGNOSIS — L97522 Non-pressure chronic ulcer of other part of left foot with fat layer exposed: Secondary | ICD-10-CM | POA: Diagnosis not present

## 2017-07-09 DIAGNOSIS — Z9889 Other specified postprocedural states: Secondary | ICD-10-CM

## 2017-07-09 DIAGNOSIS — Z79899 Other long term (current) drug therapy: Secondary | ICD-10-CM | POA: Diagnosis not present

## 2017-07-09 DIAGNOSIS — M2042 Other hammer toe(s) (acquired), left foot: Secondary | ICD-10-CM | POA: Insufficient documentation

## 2017-07-09 DIAGNOSIS — M19072 Primary osteoarthritis, left ankle and foot: Secondary | ICD-10-CM | POA: Diagnosis not present

## 2017-07-09 DIAGNOSIS — M216X2 Other acquired deformities of left foot: Secondary | ICD-10-CM | POA: Diagnosis not present

## 2017-07-09 DIAGNOSIS — L97529 Non-pressure chronic ulcer of other part of left foot with unspecified severity: Secondary | ICD-10-CM | POA: Insufficient documentation

## 2017-07-09 HISTORY — PX: HAMMER TOE SURGERY: SHX385

## 2017-07-09 HISTORY — PX: WEIL OSTEOTOMY: SHX5044

## 2017-07-09 SURGERY — CORRECTION, HAMMER TOE
Anesthesia: Monitor Anesthesia Care | Site: Foot | Laterality: Left

## 2017-07-09 MED ORDER — MIDAZOLAM HCL 2 MG/2ML IJ SOLN
1.0000 mg | INTRAMUSCULAR | Status: AC
  Administered 2017-07-09: 2 mg via INTRAVENOUS

## 2017-07-09 MED ORDER — BUPIVACAINE HCL (PF) 0.5 % IJ SOLN
INTRAMUSCULAR | Status: DC | PRN
  Administered 2017-07-09: 20 mL

## 2017-07-09 MED ORDER — MIDAZOLAM HCL 2 MG/2ML IJ SOLN
INTRAMUSCULAR | Status: AC
  Filled 2017-07-09: qty 2

## 2017-07-09 MED ORDER — FENTANYL CITRATE (PF) 100 MCG/2ML IJ SOLN
25.0000 ug | Freq: Once | INTRAMUSCULAR | Status: AC
  Administered 2017-07-09: 25 ug via INTRAVENOUS

## 2017-07-09 MED ORDER — BUPIVACAINE HCL (PF) 0.5 % IJ SOLN
INTRAMUSCULAR | Status: AC
  Filled 2017-07-09: qty 30

## 2017-07-09 MED ORDER — FENTANYL CITRATE (PF) 100 MCG/2ML IJ SOLN
INTRAMUSCULAR | Status: AC
  Filled 2017-07-09: qty 2

## 2017-07-09 MED ORDER — MIDAZOLAM HCL 5 MG/5ML IJ SOLN
INTRAMUSCULAR | Status: DC | PRN
  Administered 2017-07-09 (×2): 1 mg via INTRAVENOUS

## 2017-07-09 MED ORDER — CEFAZOLIN SODIUM-DEXTROSE 2-4 GM/100ML-% IV SOLN
2.0000 g | Freq: Once | INTRAVENOUS | Status: AC
  Administered 2017-07-09: 2 g via INTRAVENOUS

## 2017-07-09 MED ORDER — PROPOFOL 500 MG/50ML IV EMUL
INTRAVENOUS | Status: DC | PRN
  Administered 2017-07-09: 50 ug/kg/min via INTRAVENOUS

## 2017-07-09 MED ORDER — PROPOFOL 10 MG/ML IV BOLUS
INTRAVENOUS | Status: AC
  Filled 2017-07-09: qty 20

## 2017-07-09 MED ORDER — 0.9 % SODIUM CHLORIDE (POUR BTL) OPTIME
TOPICAL | Status: DC | PRN
  Administered 2017-07-09: 1000 mL

## 2017-07-09 MED ORDER — LACTATED RINGERS IV SOLN
INTRAVENOUS | Status: DC
  Administered 2017-07-09: 10:00:00 via INTRAVENOUS

## 2017-07-09 MED ORDER — FENTANYL CITRATE (PF) 100 MCG/2ML IJ SOLN: 25.0000 ug | INTRAMUSCULAR | Status: DC | PRN

## 2017-07-09 MED ORDER — FENTANYL CITRATE (PF) 100 MCG/2ML IJ SOLN
INTRAMUSCULAR | Status: DC | PRN
  Administered 2017-07-09: 25 ug via INTRAVENOUS

## 2017-07-09 MED ORDER — CEFAZOLIN SODIUM-DEXTROSE 2-4 GM/100ML-% IV SOLN
INTRAVENOUS | Status: AC
  Filled 2017-07-09: qty 100

## 2017-07-09 SURGICAL SUPPLY — 45 items
BAG HAMPER (MISCELLANEOUS) ×3 IMPLANT
BANDAGE ELASTIC 4 LF NS (GAUZE/BANDAGES/DRESSINGS) ×3 IMPLANT
BANDAGE ESMARK 4X12 BL STRL LF (DISPOSABLE) ×1 IMPLANT
BENZOIN TINCTURE PRP APPL 2/3 (GAUZE/BANDAGES/DRESSINGS) ×3 IMPLANT
BLADE AVERAGE 25MMX9MM (BLADE) ×1
BLADE AVERAGE 25X9 (BLADE) ×2 IMPLANT
BLADE OSC/SAG 18.5X9 THN (BLADE) ×3 IMPLANT
BLADE SURG 15 STRL LF DISP TIS (BLADE) ×1 IMPLANT
BLADE SURG 15 STRL SS (BLADE) ×2
BNDG CONFORM 2 STRL LF (GAUZE/BANDAGES/DRESSINGS) ×3 IMPLANT
BNDG ESMARK 4X12 BLUE STRL LF (DISPOSABLE) ×3
BNDG GAUZE ELAST 4 BULKY (GAUZE/BANDAGES/DRESSINGS) ×3 IMPLANT
CHLORAPREP W/TINT 26ML (MISCELLANEOUS) ×3 IMPLANT
CLOSURE WOUND 1/2 X4 (GAUZE/BANDAGES/DRESSINGS) ×1
CLOTH BEACON ORANGE TIMEOUT ST (SAFETY) ×3 IMPLANT
COVER LIGHT HANDLE STERIS (MISCELLANEOUS) ×6 IMPLANT
CUFF TOURNIQUET SINGLE 18IN (TOURNIQUET CUFF) ×3 IMPLANT
DECANTER SPIKE VIAL GLASS SM (MISCELLANEOUS) ×3 IMPLANT
DRAPE OEC MINIVIEW 54X84 (DRAPES) ×3 IMPLANT
DRSG ADAPTIC 3X8 NADH LF (GAUZE/BANDAGES/DRESSINGS) ×3 IMPLANT
ELECT REM PT RETURN 9FT ADLT (ELECTROSURGICAL) ×3
ELECTRODE REM PT RTRN 9FT ADLT (ELECTROSURGICAL) ×1 IMPLANT
GAUZE SPONGE 4X4 12PLY STRL (GAUZE/BANDAGES/DRESSINGS) ×3 IMPLANT
GLOVE BIO SURGEON STRL SZ7 (GLOVE) ×3 IMPLANT
GLOVE BIO SURGEON STRL SZ7.5 (GLOVE) ×3 IMPLANT
GLOVE BIOGEL PI IND STRL 7.0 (GLOVE) ×1 IMPLANT
GLOVE BIOGEL PI INDICATOR 7.0 (GLOVE) ×2
GOWN STRL REUS W/TWL LRG LVL3 (GOWN DISPOSABLE) ×9 IMPLANT
KIT ROOM TURNOVER AP CYSTO (KITS) ×3 IMPLANT
KIT ROOM TURNOVER APOR (KITS) ×3 IMPLANT
MANIFOLD NEPTUNE II (INSTRUMENTS) ×3 IMPLANT
NEEDLE HYPO 27GX1-1/4 (NEEDLE) ×9 IMPLANT
NS IRRIG 1000ML POUR BTL (IV SOLUTION) ×3 IMPLANT
PACK BASIC LIMB (CUSTOM PROCEDURE TRAY) ×3 IMPLANT
PAD ARMBOARD 7.5X6 YLW CONV (MISCELLANEOUS) ×3 IMPLANT
SCREW QUICK SNAP 2.0X12MM (Screw) ×3 IMPLANT
SET BASIN LINEN APH (SET/KITS/TRAYS/PACK) ×3 IMPLANT
SPONGE LAP 18X18 X RAY DECT (DISPOSABLE) ×3 IMPLANT
SPONGE LAP 4X18 X RAY DECT (DISPOSABLE) ×6 IMPLANT
STRIP CLOSURE SKIN 1/2X4 (GAUZE/BANDAGES/DRESSINGS) ×2 IMPLANT
SUT PROLENE 4 0 PS 2 18 (SUTURE) ×3 IMPLANT
SUT VIC AB 4-0 PS2 27 (SUTURE) ×3 IMPLANT
SUT VICRYL AB 3-0 FS1 BRD 27IN (SUTURE) ×3 IMPLANT
SYR CONTROL 10ML LL (SYRINGE) ×6 IMPLANT
k-wire ×3 IMPLANT

## 2017-07-09 NOTE — Op Note (Signed)
OPERATIVE NOTE  DATE OF PROCEDURE 07/09/2017  SURGEON Marcheta Grammes, DPM  ASSISTANT SURGEON Jilda Panda, DPM  OR STAFF Circulator: Towanda Malkin, RN Scrub Person: Karin Lieu, CST Circulator Assistant: Laverta Baltimore, RN   PREOPERATIVE DIAGNOSIS 1.  Chronic ulceration, left foot 2.  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  Weil osteotomy of 2nd metatarsal, left foot 2.  Hammertoe repair of 2nd toe, left foot  ANESTHESIA Monitor Anesthesia Care   HEMOSTASIS Pneumatic ankle tourniquet set at 250 mmHg  ESTIMATED BLOOD LOSS Minimal (<5 cc)  MATERIALS USED Integra 2.0 mm screw 0.45" k-wire  INJECTABLES 0.5% Marcaine plain  PATHOLOGY None  COMPLICATIONS None  INDICATIONS FOR THE PROCEDURE:  Chronic ulceration of the left foot failed to heal with offloading and wound care.  DESCRIPTION OF THE PROCEDURE:  The patient was brought to the operating room and placed on the operative table in the supine position.  A pneumatic ankle tourniquet was applied to the operative extremity.  Following sedation, the surgical site was anesthetized with 0.5% Marcaine plain.  The foot was then prepped, scrubbed, and draped in the usual sterile technique.  The foot was elevated, exsanguinated and the pneumatic ankle tourniquet inflated to 250 mmHg.    Attention was was directed to the dorsal aspect of the left foot.  A linear longitudinal incision was made extending from the dorsal aspect of the second ray.  Dissection was continued deep down to the level of the extensor digitorum longus tendon.  The extensor hood was released.  A transverse tenotomy and capsulotomy was performed at the proximal interphalangeal joint.  The tendon was reflected proximally to allow for adequate exposure of the second MPJ.  A linear capsulotomy was performed over the dorsal aspect of the second MPJ.  A plantar  release was performed using a [employer] planner.  A power bone saw was used to create a Weil osteotomy in the second metatarsal.  The capital fragment was translated proximally and fixated with a 2.0 mm Integra screw.  Position of the screw was confirmed with fluoroscopy.  Attention was redirected to the proximal interphalangeal joint of the second digit.  The articular surface was resected from the head of the proximal phalanx and base of the middle phalanx.  Persistent contracture of the second toe was noted.  The flexor digitorum longus tendon was identified and transected.  The contracture of the second digit reduced.  The toe was fixated with a 0.045 inch Kirschner wire.  Position of the wire was confirmed with fluoroscopy.  The wire was bent, cut and capped.  The second MPJ capsule was reapproximated with 4-0 Vicryl.  The extensor tendon was reapproximated with 4-0 Vicryl.  The subcutaneous tissue was reapproximated with 4-0 Vicryl.  The skin was reapproximated with 4-0 Prolene.  A sterile compressive dressing was applied to the operative foot.  The pneumatic ankle tourniquet was deflated and a prompt hyperemic response was noted to all digits of the operative foot.   The patient tolerated the procedure well.  The patient was then transferred to PACU with vital signs stable and vascular status intact to all toes of the operative foot.

## 2017-07-09 NOTE — Brief Op Note (Signed)
BRIEF OPERATIVE NOTE  DATE OF PROCEDURE 07/09/2017  SURGEON Marcheta Grammes, DPM  ASSISTANT SURGEON Jilda Panda, DPM  OR STAFF Circulator: Towanda Malkin, RN Scrub Person: Karin Lieu, CST Circulator Assistant: Laverta Baltimore, RN   PREOPERATIVE DIAGNOSIS 1.  Chronic ulceration, left foot 2.  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  Weil osteotomy of 2nd metatarsal, left foot 2.  Hammertoe repair of 2nd toe, left foot  ANESTHESIA Monitor Anesthesia Care   HEMOSTASIS Pneumatic ankle tourniquet set at 250 mmHg  ESTIMATED BLOOD LOSS Minimal (<5 cc)  MATERIALS USED Integra 2.0 mm screw 0.045" k-wire  INJECTABLES 0.5% Marcaine plain  PATHOLOGY None  COMPLICATIONS None

## 2017-07-09 NOTE — Anesthesia Preprocedure Evaluation (Signed)
Anesthesia Evaluation  Patient identified by MRN, date of birth, ID band Patient awake    Reviewed: Allergy & Precautions, NPO status , Patient's Chart, lab work & pertinent test results  History of Anesthesia Complications (+) PONV and history of anesthetic complications  Airway Mallampati: I  TM Distance: >3 FB Neck ROM: Full    Dental  (+) Edentulous Upper, Edentulous Lower   Pulmonary former smoker,    breath sounds clear to auscultation       Cardiovascular hypertension, Pt. on medications  Rhythm:Regular Rate:Normal     Neuro/Psych negative neurological ROS  negative psych ROS   GI/Hepatic negative GI ROS, Neg liver ROS,   Endo/Other  negative endocrine ROS  Renal/GU negative Renal ROS     Musculoskeletal   Abdominal   Peds  Hematology negative hematology ROS (+)   Anesthesia Other Findings   Reproductive/Obstetrics                             Anesthesia Physical Anesthesia Plan  ASA: II  Anesthesia Plan: MAC   Post-op Pain Management:    Induction: Intravenous  PONV Risk Score and Plan:   Airway Management Planned: Simple Face Mask  Additional Equipment:   Intra-op Plan:   Post-operative Plan:   Informed Consent: I have reviewed the patients History and Physical, chart, labs and discussed the procedure including the risks, benefits and alternatives for the proposed anesthesia with the patient or authorized representative who has indicated his/her understanding and acceptance.     Plan Discussed with:   Anesthesia Plan Comments:         Anesthesia Quick Evaluation

## 2017-07-09 NOTE — Transfer of Care (Signed)
Immediate Anesthesia Transfer of Care Note  Patient: Carl Young  Procedure(s) Performed: HAMMER TOE CORRECTION 2ND DIGIT LEFT FOOT (Left Foot) WEIL OSTEOTOMY 2ND METATARSAL LEFT FOOT (Left Foot)  Patient Location: PACU  Anesthesia Type:MAC  Level of Consciousness: awake and patient cooperative  Airway & Oxygen Therapy: Patient Spontanous Breathing and Patient connected to face mask oxygen  Post-op Assessment: Report given to RN, Post -op Vital signs reviewed and stable and Patient moving all extremities  Post vital signs: Reviewed and stable  Last Vitals:  Vitals:   07/09/17 1025 07/09/17 1030  BP: 129/72 127/74  Resp: 16 20  Temp:    SpO2: 95% 96%    Last Pain:  Vitals:   07/09/17 0920  TempSrc: Oral         Complications: No apparent anesthesia complications

## 2017-07-09 NOTE — H&P (Signed)
HISTORY AND PHYSICAL INTERVAL NOTE:  07/09/2017  9:53 AM  Doreene Nest Ciocca  has presented today for surgery, with the diagnosis of chronic ulceration left foot, hammertoe deformity 2nd digit left foot, plantarflexed 2nd metatarsal left foot.  The various methods of treatment have been discussed with the patient.  No guarantees were given.  After consideration of risks, benefits and other options for treatment, the patient has consented to surgery.  I have reviewed the patients' chart and labs.    Patient Vitals for the past 24 hrs:  BP Temp Temp src Resp SpO2 Height Weight  07/09/17 0950 123/75 - - 16 95 % - -  07/09/17 0945 - - - 16 98 % - -  07/09/17 0940 - - - 16 95 % - -  07/09/17 0936 (!) 145/77 - - 17 95 % - -  07/09/17 0920 - 98.2 F (36.8 C) Oral - - - -  07/09/17 0914 - - - - - 5\' 11"  (1.803 m) 204 lb (92.5 kg)    A history and physical examination was performed in my office.  The patient was reexamined.  There have been no changes to this history and physical examination.  Marcheta Grammes, DPM

## 2017-07-09 NOTE — Discharge Instructions (Signed)
These instructions will give you an idea of what to expect after surgery and how to manage issues that may arise before your first post op office visit. ° °Pain Management °Pain is best managed by “staying ahead” of it. If pain gets out of control, it is difficult to get it back under control. Local anesthesia that lasts 6-8 hours is used to numb the foot and decrease pain.  For the best pain control, take the pain medication every 4 hours for the first 2 days post op. On the third day pain medication can be taken as needed.  ° °Post Op Nausea °Nausea is common after surgery, so it is managed proactively.  °If prescribed, use the prescribed nausea medication regularly for the first 2 days post op. ° °Bandages °Do not worry if there is blood on the bandage. What looks like a lot of blood on the bandage is actually a small amount. Blood on the dressing spreads out as it is absorbed by the gauze, the same way a drop of water spreads out on a paper towel.  °If the bandages feel wet or dry, stiff and uncomfortable, call the office during office hours and we will schedule a time for you to have the bandage changed.  °Unless you are specifically told otherwise, we will do the first bandage change in the office.  °Keep your bandage dry. If the bandage becomes wet or soiled, notify the office and we will schedule a time to change the bandage. ° °Activity °It is best to spend most of the first 2 days after surgery lying down with the foot elevated above the level of your heart. °You may put weight on your heel while wearing the surgical shoe.   °You may only get up to go to the restroom. ° °Driving °Do not drive until you are able to respond in an emergency (i.e. slam on the brakes). This usually occurs after the bone has healed - 6 to 8 weeks. ° °Call the Office °If you have a fever over 101°F.  °If you have increasing pain after the initial post op pain has settled down.  °If you have increasing redness, swelling, or  drainage.  °If you have any questions or concerns.  ° ° °PATIENT INSTRUCTIONS °POST-ANESTHESIA ° °IMMEDIATELY FOLLOWING SURGERY:  Do not drive or operate machinery for the first twenty four hours after surgery.  Do not make any important decisions for twenty four hours after surgery or while taking narcotic pain medications or sedatives.  If you develop intractable nausea and vomiting or a severe headache please notify your doctor immediately. ° °FOLLOW-UP:  Please make an appointment with your surgeon as instructed. You do not need to follow up with anesthesia unless specifically instructed to do so. ° °WOUND CARE INSTRUCTIONS (if applicable):  Keep a dry clean dressing on the anesthesia/puncture wound site if there is drainage.  Once the wound has quit draining you may leave it open to air.  Generally you should leave the bandage intact for twenty four hours unless there is drainage.  If the epidural site drains for more than 36-48 hours please call the anesthesia department. ° °QUESTIONS?:  Please feel free to call your physician or the hospital operator if you have any questions, and they will be happy to assist you.    ° ° ° °

## 2017-07-09 NOTE — Anesthesia Postprocedure Evaluation (Signed)
Anesthesia Post Note  Patient: Carl Young  Procedure(s) Performed: HAMMER TOE CORRECTION 2ND DIGIT LEFT FOOT (Left Foot) WEIL OSTEOTOMY 2ND METATARSAL LEFT FOOT (Left Foot)  Patient location during evaluation: PACU Anesthesia Type: MAC Level of consciousness: awake and alert and patient cooperative Pain management: pain level controlled Vital Signs Assessment: post-procedure vital signs reviewed and stable Respiratory status: spontaneous breathing, nonlabored ventilation and respiratory function stable Cardiovascular status: blood pressure returned to baseline Postop Assessment: no apparent nausea or vomiting Anesthetic complications: no     Last Vitals:  Vitals:   07/09/17 1030 07/09/17 1153  BP: 127/74 (!) (P) 108/95  Resp: 20 (P) 14  Temp:  (P) 36.8 C  SpO2: 96% (P) 96%    Last Pain:  Vitals:   07/09/17 0920  TempSrc: Oral                 Naman Spychalski J

## 2017-07-10 ENCOUNTER — Encounter (HOSPITAL_COMMUNITY): Payer: Self-pay | Admitting: Podiatry

## 2017-07-10 NOTE — Addendum Note (Signed)
Addendum  created 07/10/17 1044 by Charmaine Downs, CRNA   Intraprocedure Meds edited

## 2017-07-22 DIAGNOSIS — Z4889 Encounter for other specified surgical aftercare: Secondary | ICD-10-CM | POA: Diagnosis not present

## 2017-08-13 DIAGNOSIS — I10 Essential (primary) hypertension: Secondary | ICD-10-CM | POA: Diagnosis not present

## 2017-08-13 DIAGNOSIS — Z299 Encounter for prophylactic measures, unspecified: Secondary | ICD-10-CM | POA: Diagnosis not present

## 2017-08-13 DIAGNOSIS — E7800 Pure hypercholesterolemia, unspecified: Secondary | ICD-10-CM | POA: Diagnosis not present

## 2017-08-13 DIAGNOSIS — Z6827 Body mass index (BMI) 27.0-27.9, adult: Secondary | ICD-10-CM | POA: Diagnosis not present

## 2017-08-13 DIAGNOSIS — Z87891 Personal history of nicotine dependence: Secondary | ICD-10-CM | POA: Diagnosis not present

## 2017-08-13 DIAGNOSIS — E0590 Thyrotoxicosis, unspecified without thyrotoxic crisis or storm: Secondary | ICD-10-CM | POA: Diagnosis not present

## 2017-08-13 DIAGNOSIS — R945 Abnormal results of liver function studies: Secondary | ICD-10-CM | POA: Diagnosis not present

## 2017-08-26 DIAGNOSIS — Z4889 Encounter for other specified surgical aftercare: Secondary | ICD-10-CM | POA: Diagnosis not present

## 2017-08-27 DIAGNOSIS — E0590 Thyrotoxicosis, unspecified without thyrotoxic crisis or storm: Secondary | ICD-10-CM | POA: Diagnosis not present

## 2017-08-27 DIAGNOSIS — I10 Essential (primary) hypertension: Secondary | ICD-10-CM | POA: Diagnosis not present

## 2017-08-27 DIAGNOSIS — Z299 Encounter for prophylactic measures, unspecified: Secondary | ICD-10-CM | POA: Diagnosis not present

## 2017-08-27 DIAGNOSIS — R945 Abnormal results of liver function studies: Secondary | ICD-10-CM | POA: Diagnosis not present

## 2017-08-27 DIAGNOSIS — E7800 Pure hypercholesterolemia, unspecified: Secondary | ICD-10-CM | POA: Diagnosis not present

## 2017-09-23 DIAGNOSIS — Z4889 Encounter for other specified surgical aftercare: Secondary | ICD-10-CM | POA: Diagnosis not present

## 2017-10-08 DIAGNOSIS — R945 Abnormal results of liver function studies: Secondary | ICD-10-CM | POA: Diagnosis not present

## 2017-10-15 DIAGNOSIS — C44519 Basal cell carcinoma of skin of other part of trunk: Secondary | ICD-10-CM | POA: Diagnosis not present

## 2017-10-15 DIAGNOSIS — X32XXXD Exposure to sunlight, subsequent encounter: Secondary | ICD-10-CM | POA: Diagnosis not present

## 2017-10-15 DIAGNOSIS — D225 Melanocytic nevi of trunk: Secondary | ICD-10-CM | POA: Diagnosis not present

## 2017-10-15 DIAGNOSIS — Z1283 Encounter for screening for malignant neoplasm of skin: Secondary | ICD-10-CM | POA: Diagnosis not present

## 2017-10-15 DIAGNOSIS — L570 Actinic keratosis: Secondary | ICD-10-CM | POA: Diagnosis not present

## 2017-10-15 DIAGNOSIS — L820 Inflamed seborrheic keratosis: Secondary | ICD-10-CM | POA: Diagnosis not present

## 2017-11-11 DIAGNOSIS — M2042 Other hammer toe(s) (acquired), left foot: Secondary | ICD-10-CM | POA: Diagnosis not present

## 2017-11-11 DIAGNOSIS — L97522 Non-pressure chronic ulcer of other part of left foot with fat layer exposed: Secondary | ICD-10-CM | POA: Diagnosis not present

## 2017-11-11 DIAGNOSIS — M216X2 Other acquired deformities of left foot: Secondary | ICD-10-CM | POA: Diagnosis not present

## 2017-11-13 DIAGNOSIS — I10 Essential (primary) hypertension: Secondary | ICD-10-CM | POA: Diagnosis not present

## 2017-11-13 DIAGNOSIS — Z6829 Body mass index (BMI) 29.0-29.9, adult: Secondary | ICD-10-CM | POA: Diagnosis not present

## 2017-11-13 DIAGNOSIS — Z713 Dietary counseling and surveillance: Secondary | ICD-10-CM | POA: Diagnosis not present

## 2017-11-13 DIAGNOSIS — Z299 Encounter for prophylactic measures, unspecified: Secondary | ICD-10-CM | POA: Diagnosis not present

## 2017-11-13 DIAGNOSIS — E7800 Pure hypercholesterolemia, unspecified: Secondary | ICD-10-CM | POA: Diagnosis not present

## 2017-11-27 DIAGNOSIS — X32XXXD Exposure to sunlight, subsequent encounter: Secondary | ICD-10-CM | POA: Diagnosis not present

## 2017-11-27 DIAGNOSIS — Z85828 Personal history of other malignant neoplasm of skin: Secondary | ICD-10-CM | POA: Diagnosis not present

## 2017-11-27 DIAGNOSIS — Z08 Encounter for follow-up examination after completed treatment for malignant neoplasm: Secondary | ICD-10-CM | POA: Diagnosis not present

## 2017-11-27 DIAGNOSIS — L570 Actinic keratosis: Secondary | ICD-10-CM | POA: Diagnosis not present

## 2017-11-27 DIAGNOSIS — L820 Inflamed seborrheic keratosis: Secondary | ICD-10-CM | POA: Diagnosis not present

## 2017-12-05 DIAGNOSIS — H353132 Nonexudative age-related macular degeneration, bilateral, intermediate dry stage: Secondary | ICD-10-CM | POA: Diagnosis not present

## 2017-12-10 DIAGNOSIS — R945 Abnormal results of liver function studies: Secondary | ICD-10-CM | POA: Diagnosis not present

## 2018-01-18 DIAGNOSIS — L97529 Non-pressure chronic ulcer of other part of left foot with unspecified severity: Secondary | ICD-10-CM | POA: Diagnosis not present

## 2018-01-23 DIAGNOSIS — L97521 Non-pressure chronic ulcer of other part of left foot limited to breakdown of skin: Secondary | ICD-10-CM | POA: Diagnosis not present

## 2018-02-06 DIAGNOSIS — L97521 Non-pressure chronic ulcer of other part of left foot limited to breakdown of skin: Secondary | ICD-10-CM | POA: Diagnosis not present

## 2018-02-17 DIAGNOSIS — L97521 Non-pressure chronic ulcer of other part of left foot limited to breakdown of skin: Secondary | ICD-10-CM | POA: Diagnosis not present

## 2018-02-19 DIAGNOSIS — Z6826 Body mass index (BMI) 26.0-26.9, adult: Secondary | ICD-10-CM | POA: Diagnosis not present

## 2018-02-19 DIAGNOSIS — Z299 Encounter for prophylactic measures, unspecified: Secondary | ICD-10-CM | POA: Diagnosis not present

## 2018-02-19 DIAGNOSIS — E0590 Thyrotoxicosis, unspecified without thyrotoxic crisis or storm: Secondary | ICD-10-CM | POA: Diagnosis not present

## 2018-02-19 DIAGNOSIS — I10 Essential (primary) hypertension: Secondary | ICD-10-CM | POA: Diagnosis not present

## 2018-02-19 DIAGNOSIS — E7800 Pure hypercholesterolemia, unspecified: Secondary | ICD-10-CM | POA: Diagnosis not present

## 2018-03-10 DIAGNOSIS — L97521 Non-pressure chronic ulcer of other part of left foot limited to breakdown of skin: Secondary | ICD-10-CM | POA: Diagnosis not present

## 2018-03-16 DIAGNOSIS — Z1331 Encounter for screening for depression: Secondary | ICD-10-CM | POA: Diagnosis not present

## 2018-03-16 DIAGNOSIS — R5383 Other fatigue: Secondary | ICD-10-CM | POA: Diagnosis not present

## 2018-03-16 DIAGNOSIS — Z79899 Other long term (current) drug therapy: Secondary | ICD-10-CM | POA: Diagnosis not present

## 2018-03-16 DIAGNOSIS — E0590 Thyrotoxicosis, unspecified without thyrotoxic crisis or storm: Secondary | ICD-10-CM | POA: Diagnosis not present

## 2018-03-16 DIAGNOSIS — Z1211 Encounter for screening for malignant neoplasm of colon: Secondary | ICD-10-CM | POA: Diagnosis not present

## 2018-03-16 DIAGNOSIS — Z1339 Encounter for screening examination for other mental health and behavioral disorders: Secondary | ICD-10-CM | POA: Diagnosis not present

## 2018-03-16 DIAGNOSIS — Z6827 Body mass index (BMI) 27.0-27.9, adult: Secondary | ICD-10-CM | POA: Diagnosis not present

## 2018-03-16 DIAGNOSIS — Z0000 Encounter for general adult medical examination without abnormal findings: Secondary | ICD-10-CM | POA: Diagnosis not present

## 2018-03-16 DIAGNOSIS — Z125 Encounter for screening for malignant neoplasm of prostate: Secondary | ICD-10-CM | POA: Diagnosis not present

## 2018-03-16 DIAGNOSIS — I10 Essential (primary) hypertension: Secondary | ICD-10-CM | POA: Diagnosis not present

## 2018-03-16 DIAGNOSIS — E7800 Pure hypercholesterolemia, unspecified: Secondary | ICD-10-CM | POA: Diagnosis not present

## 2018-03-16 DIAGNOSIS — Z7189 Other specified counseling: Secondary | ICD-10-CM | POA: Diagnosis not present

## 2018-03-16 DIAGNOSIS — Z299 Encounter for prophylactic measures, unspecified: Secondary | ICD-10-CM | POA: Diagnosis not present

## 2018-03-17 ENCOUNTER — Ambulatory Visit (HOSPITAL_COMMUNITY)
Admission: RE | Admit: 2018-03-17 | Discharge: 2018-03-17 | Disposition: A | Discharge status: Home / Self Care | Payer: Medicare Other | Source: Ambulatory Visit | Attending: Internal Medicine | Admitting: Internal Medicine

## 2018-03-17 ENCOUNTER — Other Ambulatory Visit (HOSPITAL_COMMUNITY): Payer: Self-pay | Admitting: Internal Medicine

## 2018-03-17 DIAGNOSIS — R05 Cough: Secondary | ICD-10-CM | POA: Diagnosis not present

## 2018-03-17 DIAGNOSIS — L97521 Non-pressure chronic ulcer of other part of left foot limited to breakdown of skin: Secondary | ICD-10-CM | POA: Diagnosis not present

## 2018-03-17 DIAGNOSIS — R059 Cough, unspecified: Secondary | ICD-10-CM

## 2018-03-24 DIAGNOSIS — L97521 Non-pressure chronic ulcer of other part of left foot limited to breakdown of skin: Secondary | ICD-10-CM | POA: Diagnosis not present

## 2018-03-31 DIAGNOSIS — L97521 Non-pressure chronic ulcer of other part of left foot limited to breakdown of skin: Secondary | ICD-10-CM | POA: Diagnosis not present

## 2018-04-07 DIAGNOSIS — L97521 Non-pressure chronic ulcer of other part of left foot limited to breakdown of skin: Secondary | ICD-10-CM | POA: Diagnosis not present

## 2018-04-14 DIAGNOSIS — L97521 Non-pressure chronic ulcer of other part of left foot limited to breakdown of skin: Secondary | ICD-10-CM | POA: Diagnosis not present

## 2018-04-21 DIAGNOSIS — L97521 Non-pressure chronic ulcer of other part of left foot limited to breakdown of skin: Secondary | ICD-10-CM | POA: Diagnosis not present

## 2018-04-21 DIAGNOSIS — Z23 Encounter for immunization: Secondary | ICD-10-CM | POA: Diagnosis not present

## 2018-04-28 DIAGNOSIS — L97521 Non-pressure chronic ulcer of other part of left foot limited to breakdown of skin: Secondary | ICD-10-CM | POA: Diagnosis not present

## 2018-05-03 IMAGING — CR DG FOOT COMPLETE 3+V*L*
1 series · 3 of 3 positions shown · non-contrast
Comparison: None.

CLINICAL DATA: Postop hammertoe deformity repair

EXAM:
LEFT FOOT - COMPLETE 3+ VIEW

[Series 1: ap · 0.17mm/px · 3 of 3 slices shown]
[im 1/3]
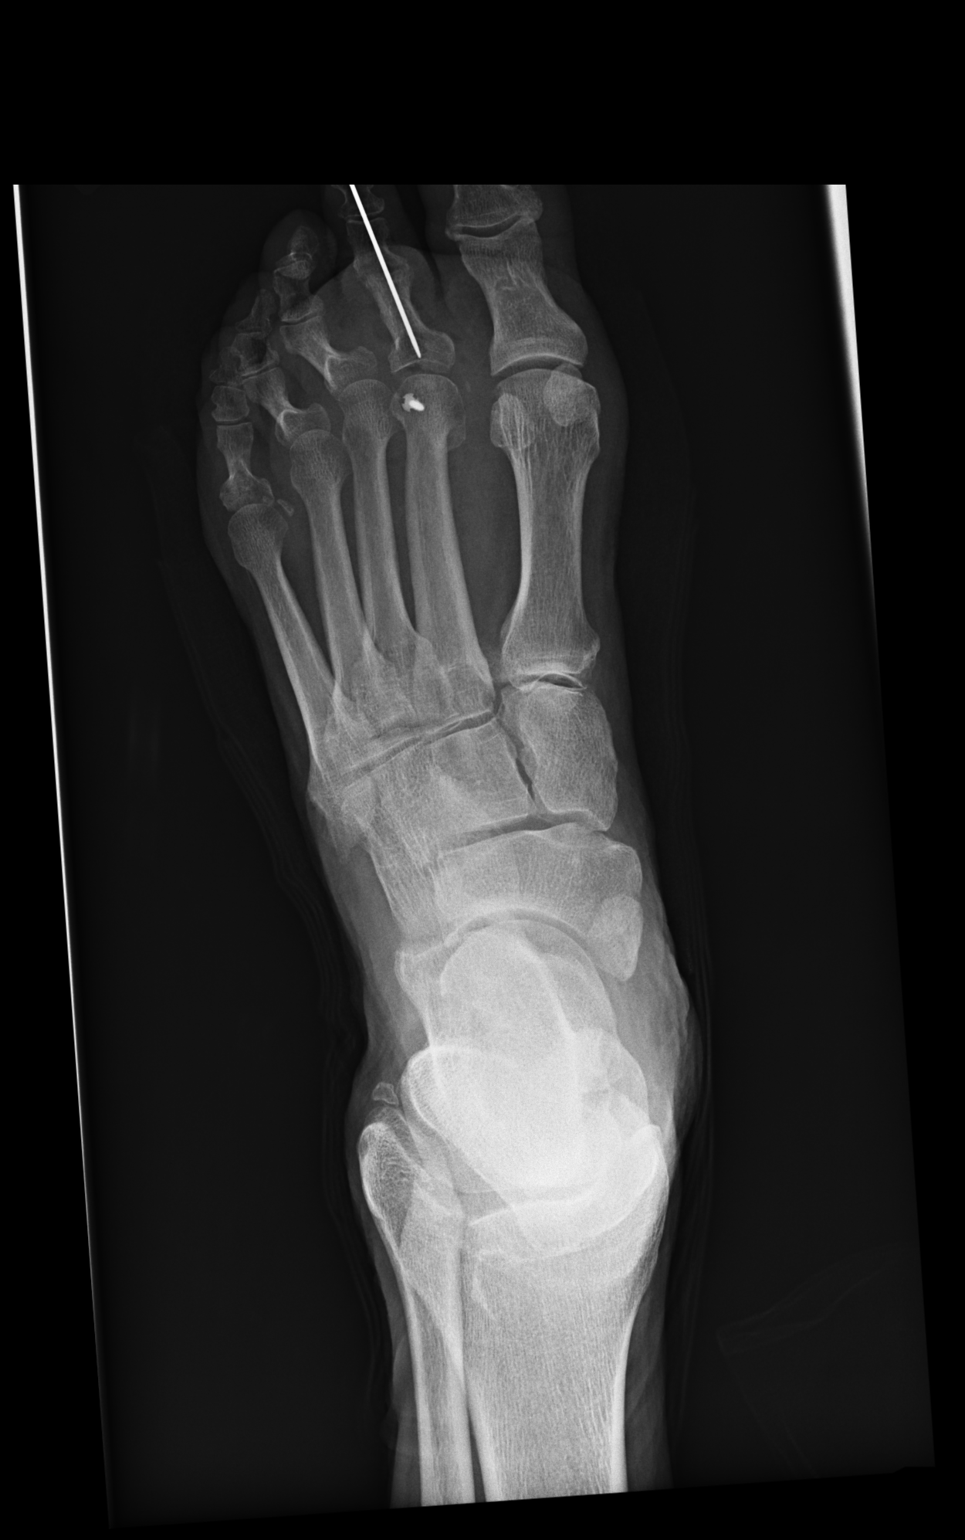
[im 2/3]
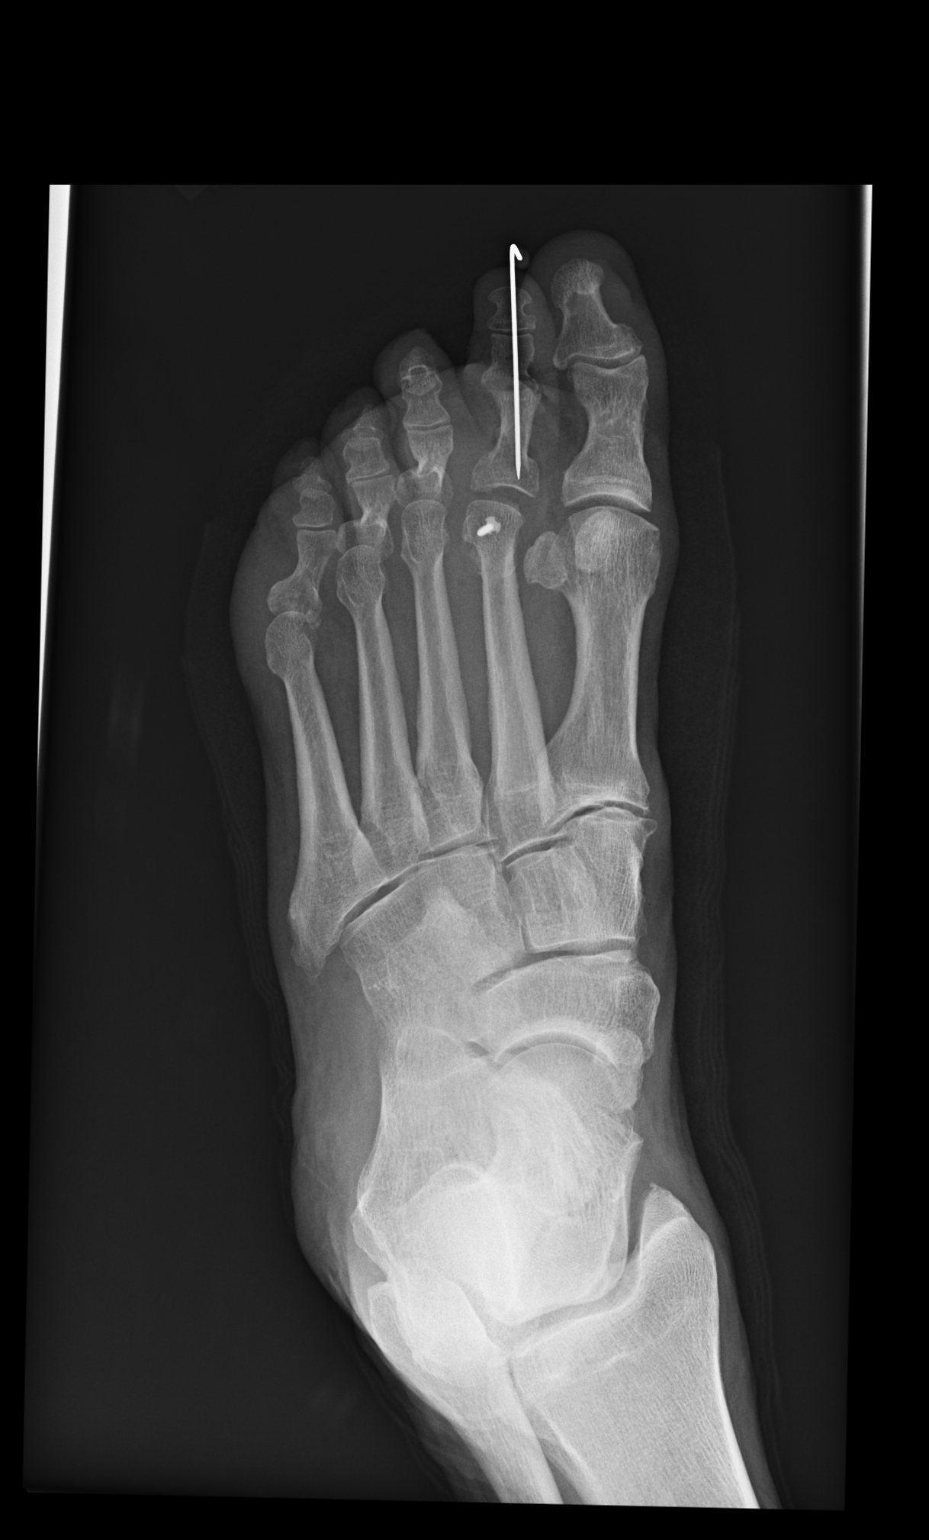
[im 3/3]
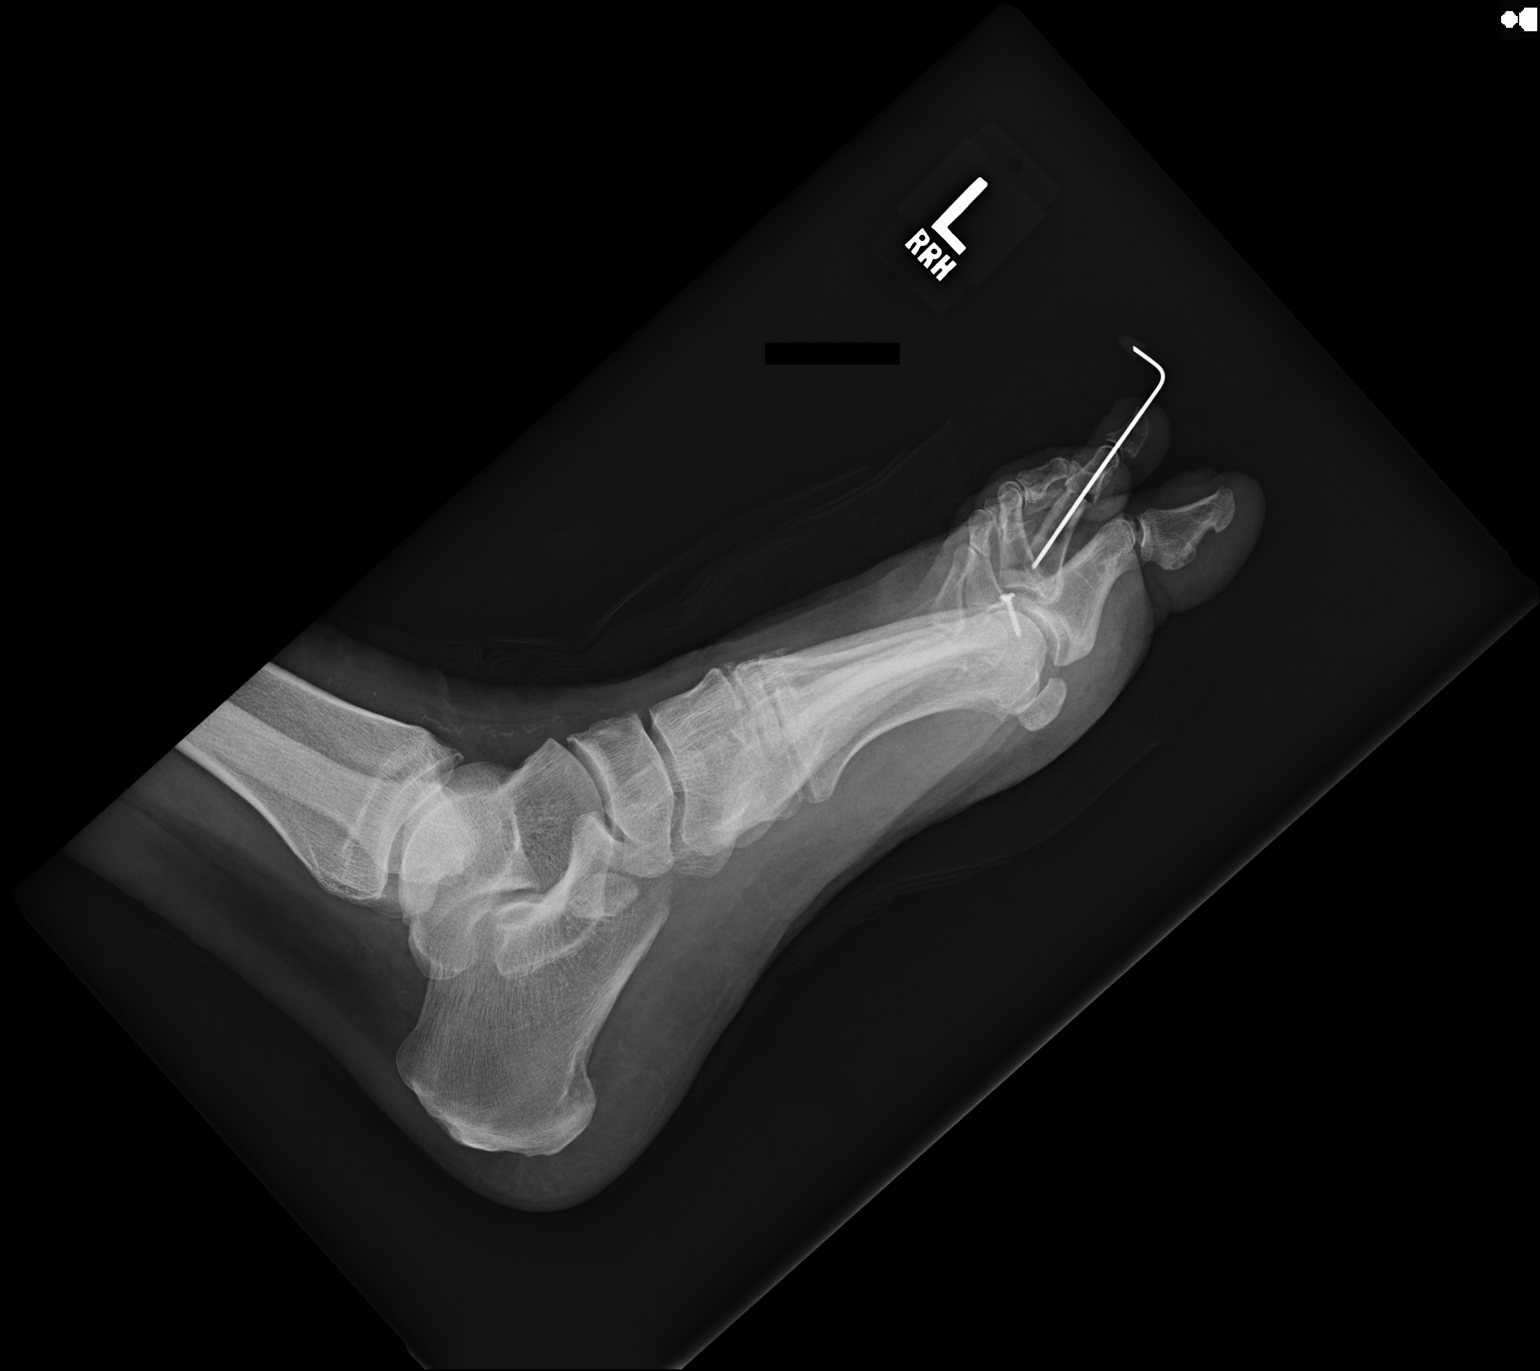

[3 of 3 positions shown; findings below may reference images not displayed]

FINDINGS: No fracture dislocation. Arthrodesis of the second PIP joint
transfixed with a K-wire. Fixation screw in the second metatarsal
head without failure or complication. Moderate osteoarthritis of
first tarsometatarsal joint. No soft tissue abnormality.
IMPRESSION: Arthrodesis of the second PIP joint transfixed with a K-wire.
Fixation screw in the second metatarsal head without failure or
complication.

Moderate osteoarthritis of first tarsometatarsal joint.

## 2018-05-05 DIAGNOSIS — L97521 Non-pressure chronic ulcer of other part of left foot limited to breakdown of skin: Secondary | ICD-10-CM | POA: Diagnosis not present

## 2018-05-12 DIAGNOSIS — L97521 Non-pressure chronic ulcer of other part of left foot limited to breakdown of skin: Secondary | ICD-10-CM | POA: Diagnosis not present

## 2018-05-26 DIAGNOSIS — L97521 Non-pressure chronic ulcer of other part of left foot limited to breakdown of skin: Secondary | ICD-10-CM | POA: Diagnosis not present

## 2018-06-01 DIAGNOSIS — L820 Inflamed seborrheic keratosis: Secondary | ICD-10-CM | POA: Diagnosis not present

## 2018-06-01 DIAGNOSIS — D225 Melanocytic nevi of trunk: Secondary | ICD-10-CM | POA: Diagnosis not present

## 2018-06-01 DIAGNOSIS — C44619 Basal cell carcinoma of skin of left upper limb, including shoulder: Secondary | ICD-10-CM | POA: Diagnosis not present

## 2018-06-01 DIAGNOSIS — L570 Actinic keratosis: Secondary | ICD-10-CM | POA: Diagnosis not present

## 2018-06-01 DIAGNOSIS — L821 Other seborrheic keratosis: Secondary | ICD-10-CM | POA: Diagnosis not present

## 2018-06-01 DIAGNOSIS — X32XXXD Exposure to sunlight, subsequent encounter: Secondary | ICD-10-CM | POA: Diagnosis not present

## 2018-06-09 DIAGNOSIS — L97522 Non-pressure chronic ulcer of other part of left foot with fat layer exposed: Secondary | ICD-10-CM | POA: Diagnosis not present

## 2018-06-10 DIAGNOSIS — H2513 Age-related nuclear cataract, bilateral: Secondary | ICD-10-CM | POA: Diagnosis not present

## 2018-06-16 DIAGNOSIS — L97522 Non-pressure chronic ulcer of other part of left foot with fat layer exposed: Secondary | ICD-10-CM | POA: Diagnosis not present

## 2018-06-30 DIAGNOSIS — L97522 Non-pressure chronic ulcer of other part of left foot with fat layer exposed: Secondary | ICD-10-CM | POA: Diagnosis not present

## 2018-07-07 DIAGNOSIS — L97522 Non-pressure chronic ulcer of other part of left foot with fat layer exposed: Secondary | ICD-10-CM | POA: Diagnosis not present

## 2018-07-13 DIAGNOSIS — L570 Actinic keratosis: Secondary | ICD-10-CM | POA: Diagnosis not present

## 2018-07-13 DIAGNOSIS — Z08 Encounter for follow-up examination after completed treatment for malignant neoplasm: Secondary | ICD-10-CM | POA: Diagnosis not present

## 2018-07-13 DIAGNOSIS — Z85828 Personal history of other malignant neoplasm of skin: Secondary | ICD-10-CM | POA: Diagnosis not present

## 2018-07-13 DIAGNOSIS — X32XXXD Exposure to sunlight, subsequent encounter: Secondary | ICD-10-CM | POA: Diagnosis not present

## 2018-07-17 DIAGNOSIS — Z299 Encounter for prophylactic measures, unspecified: Secondary | ICD-10-CM | POA: Diagnosis not present

## 2018-07-17 DIAGNOSIS — I10 Essential (primary) hypertension: Secondary | ICD-10-CM | POA: Diagnosis not present

## 2018-07-17 DIAGNOSIS — L97522 Non-pressure chronic ulcer of other part of left foot with fat layer exposed: Secondary | ICD-10-CM | POA: Diagnosis not present

## 2018-07-17 DIAGNOSIS — Z6828 Body mass index (BMI) 28.0-28.9, adult: Secondary | ICD-10-CM | POA: Diagnosis not present

## 2018-07-20 ENCOUNTER — Encounter (HOSPITAL_COMMUNITY): Payer: Self-pay

## 2018-07-20 ENCOUNTER — Emergency Department (HOSPITAL_COMMUNITY): Payer: Medicare Other

## 2018-07-20 ENCOUNTER — Emergency Department (HOSPITAL_COMMUNITY)
Admission: EM | Admit: 2018-07-20 | Discharge: 2018-07-20 | Disposition: A | Discharge status: Home / Self Care | Payer: Medicare Other | Attending: Emergency Medicine | Admitting: Emergency Medicine

## 2018-07-20 DIAGNOSIS — S61111A Laceration without foreign body of right thumb with damage to nail, initial encounter: Secondary | ICD-10-CM | POA: Diagnosis not present

## 2018-07-20 DIAGNOSIS — Y929 Unspecified place or not applicable: Secondary | ICD-10-CM | POA: Insufficient documentation

## 2018-07-20 DIAGNOSIS — Y9389 Activity, other specified: Secondary | ICD-10-CM | POA: Insufficient documentation

## 2018-07-20 DIAGNOSIS — Z7982 Long term (current) use of aspirin: Secondary | ICD-10-CM | POA: Diagnosis not present

## 2018-07-20 DIAGNOSIS — Z79899 Other long term (current) drug therapy: Secondary | ICD-10-CM | POA: Diagnosis not present

## 2018-07-20 DIAGNOSIS — Z23 Encounter for immunization: Secondary | ICD-10-CM | POA: Diagnosis not present

## 2018-07-20 DIAGNOSIS — Y999 Unspecified external cause status: Secondary | ICD-10-CM | POA: Insufficient documentation

## 2018-07-20 DIAGNOSIS — S6991XA Unspecified injury of right wrist, hand and finger(s), initial encounter: Secondary | ICD-10-CM | POA: Diagnosis present

## 2018-07-20 DIAGNOSIS — Z87891 Personal history of nicotine dependence: Secondary | ICD-10-CM | POA: Diagnosis not present

## 2018-07-20 DIAGNOSIS — S62521B Displaced fracture of distal phalanx of right thumb, initial encounter for open fracture: Secondary | ICD-10-CM | POA: Insufficient documentation

## 2018-07-20 DIAGNOSIS — S61119A Laceration without foreign body of unspecified thumb with damage to nail, initial encounter: Secondary | ICD-10-CM | POA: Diagnosis not present

## 2018-07-20 DIAGNOSIS — I10 Essential (primary) hypertension: Secondary | ICD-10-CM | POA: Diagnosis not present

## 2018-07-20 DIAGNOSIS — S68622A Partial traumatic transphalangeal amputation of right middle finger, initial encounter: Secondary | ICD-10-CM | POA: Diagnosis not present

## 2018-07-20 DIAGNOSIS — W298XXA Contact with other powered powered hand tools and household machinery, initial encounter: Secondary | ICD-10-CM | POA: Diagnosis not present

## 2018-07-20 MED ORDER — TETANUS-DIPHTH-ACELL PERTUSSIS 5-2.5-18.5 LF-MCG/0.5 IM SUSP
0.5000 mL | Freq: Once | INTRAMUSCULAR | Status: AC
  Administered 2018-07-20: 0.5 mL via INTRAMUSCULAR
  Filled 2018-07-20: qty 0.5

## 2018-07-20 MED ORDER — CEPHALEXIN 500 MG PO CAPS: 500.0000 mg | ORAL_CAPSULE | Freq: Four times a day (QID) | ORAL | 0 refills | Status: DC

## 2018-07-20 MED ORDER — HYDROCODONE-ACETAMINOPHEN 5-325 MG PO TABS: 1.0000 | ORAL_TABLET | ORAL | 0 refills | Status: DC | PRN

## 2018-07-20 MED ORDER — CEPHALEXIN 500 MG PO CAPS
500.0000 mg | ORAL_CAPSULE | Freq: Once | ORAL | Status: AC
  Administered 2018-07-20: 500 mg via ORAL
  Filled 2018-07-20: qty 1

## 2018-07-20 MED ORDER — POVIDONE-IODINE 10 % EX SOLN
CUTANEOUS | Status: AC
  Filled 2018-07-20: qty 30

## 2018-07-20 NOTE — ED Notes (Signed)
Supplies set up in room for dressing

## 2018-07-20 NOTE — ED Notes (Signed)
Pt to xray

## 2018-07-20 NOTE — Discharge Instructions (Signed)
Go to Dr. Lequita Asal office tomorrow at 4 PM to be seen for further care and treatment.  He plans on seeing you then, and likely arranging for an operative repair the following day.

## 2018-07-20 NOTE — ED Triage Notes (Addendum)
Pt reports he was using a belt sander this morning and the belt separated and cut tip of right thumb. Pt reports that finger is tingling . Dressing unwrapped and conts to bleed. Pt is missing tip of finger. Pt take asa daily

## 2018-07-20 NOTE — ED Provider Notes (Signed)
[facility] EMERGENCY DEPARTMENT Provider Note   CSN: [id number] Arrival date & time: 07/20/18  1415     History   Chief Complaint Chief Complaint  Patient presents with  . Finger Injury    HPI Carl Young is a 78 y.o. male.  HPI   Patient was using a belt sander, the standing belt came apart, this caused his right thumb to be dragged beneath the sanders roller.  This injured the tip of his right thumb.  He denies other injury.  Unknown last tetanus booster.  There are no other known modifying factors.  Past Medical History:  Diagnosis Date  . Hypercholesteremia   . Hypertension   . PONV (postoperative nausea and vomiting)     Patient Active Problem List   Diagnosis Date Noted  . Noninfectious gastroenteritis and colitis 03/28/2015  . Essential hypertension 03/28/2015  . High cholesterol 03/28/2015  . History of colonic polyps 03/28/2015    Past Surgical History:  Procedure Laterality Date  . APPENDECTOMY    . COLONOSCOPY  05/06/2012   Procedure: COLONOSCOPY;  Surgeon: Rogene Houston, MD;  Location: [facility] ENDO SUITE;  Service: Endoscopy;  Laterality: N/A;  730  . HAMMER TOE SURGERY Left 07/09/2017   Procedure: HAMMER TOE CORRECTION 2ND DIGIT LEFT FOOT;  Surgeon: Caprice Beaver, DPM;  Location: [facility] ORS;  Service: Podiatry;  Laterality: Left;  . WEIL OSTEOTOMY Left 07/09/2017   Procedure: WEIL OSTEOTOMY 2ND METATARSAL LEFT FOOT;  Surgeon: Caprice Beaver, DPM;  Location: [facility] ORS;  Service: Podiatry;  Laterality: Left;        Home Medications    Prior to Admission medications   Medication Sig Start Date End Date Taking? Authorizing Provider  Ascorbic Acid (VITAMIN C PO) Take 1 tablet by mouth every morning.    Yes [provider]  aspirin EC 81 MG tablet Take 81 mg by mouth every morning.    Yes [provider]  benazepril (LOTENSIN) 40 MG tablet Take 40 mg by mouth every morning.  06/05/17  Yes [provider]  Cholecalciferol  (VITAMIN D3 PO) Take 1 capsule by mouth every evening.    Yes [provider]  ezetimibe (ZETIA) 10 MG tablet Take 10 mg by mouth every evening.    Yes [provider]  fenofibrate (TRICOR) 145 MG tablet Take 145 mg by mouth every evening.    Yes [provider]  fish oil-omega-3 fatty acids 1000 MG capsule Take 1 g by mouth every morning.    Yes [provider]  folic acid (FOLVITE) 762 MCG tablet Take 800 mcg by mouth every morning.    Yes [provider]  Garlic 8315 MG CAPS Take 1,000 mg by mouth every morning.    Yes [provider]  hydrochlorothiazide (HYDRODIURIL) 25 MG tablet Take 25 mg by mouth every morning.    Yes [provider]  Lysine 500 MG CAPS Take 500 mg by mouth every morning.    Yes [provider]  Multiple Vitamin (MULTIVITAMIN WITH MINERALS) TABS Take 1 tablet by mouth daily.   Yes [provider]  Multiple Vitamins-Minerals (PRESERVISION AREDS 2+MULTI VIT PO) Take 1 capsule by mouth 2 (two) times daily.   Yes [provider]  zinc gluconate 50 MG tablet Take 50 mg by mouth every morning.    Yes [provider]  cephALEXin (KEFLEX) 500 MG capsule Take 1 capsule (500 mg total) by mouth 4 (four) times daily. 07/20/18   Daleen Bo, MD  HYDROcodone-acetaminophen (NORCO) 5-325 MG tablet Take 1 tablet by mouth every 4 (four) hours as needed for moderate pain. 07/20/18   Daleen Bo, MD    Family History Family History  Problem Relation Age of Onset  . Kidney disease Maternal Grandmother     Social History Social History   Tobacco Use  . Smoking status: Former Smoker    Types: Cigarettes, Cigars    Last attempt to quit: 07/08/1985    Years since quitting: 33.0  . Smokeless tobacco: Never Used  Substance Use Topics  . Alcohol use: Yes    Comment: occas  . Drug use: No     Allergies   Ciprofloxacin in d5w   Review of Systems Review of Systems  All other  systems reviewed and are negative.    Physical Exam Updated Vital Signs BP (!) 181/82   Pulse (!) 103   Temp 98.8 F (37.1 C) (Oral)   Ht 5\' 11"  (1.803 m)   Wt 90.7 kg   SpO2 94%   BMI 27.89 kg/m   Physical Exam Vitals signs and nursing note reviewed.  Constitutional:      General: He is not in acute distress.    Appearance: He is well-developed. He is not ill-appearing or diaphoretic.  HENT:     Head: Normocephalic and atraumatic.     Right Ear: External ear normal.     Left Ear: External ear normal.  Eyes:     Conjunctiva/sclera: Conjunctivae normal.     Pupils: Pupils are equal, round, and reactive to light.  Neck:     Musculoskeletal: Normal range of motion.     Trachea: Phonation normal.  Cardiovascular:     Rate and Rhythm: Normal rate.  Pulmonary:     Effort: Pulmonary effort is normal.  Musculoskeletal: Normal range of motion.     Comments: Right thumb with distal ulnar tissue avulsion, going to radial aspect of the thumb nail.  Visualized tuft of distal phalanx in the wound.  Tissue maceration at the edge of the wound.  Normal IP motion of the right thumb.  Skin:    General: Skin is warm and dry.  Neurological:     Mental Status: He is alert and oriented to person, place, and time.     Cranial Nerves: No cranial nerve deficit.     Sensory: No sensory deficit.     Motor: No abnormal muscle tone.     Coordination: Coordination normal.  Psychiatric:        Behavior: Behavior normal.        Thought Content: Thought content normal.        Judgment: Judgment normal.      ED Treatments / Results  Labs (all labs ordered are listed, but only abnormal results are displayed) Labs Reviewed - No data to display  EKG None  Radiology Dg Finger Thumb Right  Result Date: 07/20/2018 CLINICAL DATA:  78 year old male status post partial amputation of the thumb EXAM: RIGHT THUMB 2+V COMPARISON:  None. FINDINGS: Partial amputation through the distal tuft of the  distal phalanx of the thumb with associated soft tissue injury. The remaining visualized bones and joints are intact. Mild degenerative osteoarthritis present at the thumb MCP and IP joints. IMPRESSION: Partial amputation through the distal tuft of the distal phalanx of the thumb with associated soft tissue injury. Electronically Signed   By: Jacqulynn Cadet M.D.   On: 07/20/2018 15:13    Procedures Wound repair Date/Time: 07/20/2018 5:30 PM Performed  by: Daleen Bo, MD Authorized by: Daleen Bo, MD  Consent: Verbal consent obtained. Consent given by: patient Patient understanding: patient states understanding of the procedure being performed Patient identity confirmed: verbally with patient Time out: Immediately prior to procedure a "time out" was called to verify the correct patient, procedure, equipment, support staff and site/side marked as required. Local anesthesia used: no  Anesthesia: Local anesthesia used: no  Sedation: Patient sedated: no  Patient tolerance: Patient tolerated the procedure well with no immediate complications Comments: The wound was cleansed with saline and debrided of clot.  Tuft fracture visualized.  Bleeding controlled with pressure.  Xeroform dressing placed with gauze and Kling.  Wound observed for 30 minutes, and then Xeroform dressing was replaced because of initial bleedthrough.  Observed post second dressing, with hemostasis and ready for discharge.    (including critical care time)  Medications Ordered in ED Medications  Tdap (BOOSTRIX) injection 0.5 mL (0.5 mLs Intramuscular Given 07/20/18 1452)  cephALEXin (KEFLEX) capsule 500 mg (500 mg Oral Given 07/20/18 1611)     Initial Impression / Assessment and Plan / ED Course  I have reviewed the triage vital signs and the nursing notes.  Pertinent labs & imaging results that were available during my care of the patient were reviewed by me and considered in my medical decision making (see  chart for details).  Clinical Course as of Jul 22 1331  Mon Jul 20, 2018  1506 Laceration, with avulsion of a portion of the tuft, ulnar aspect right thumb. Images reviewed by me  DG Finger Thumb Right [EW]  71 Dr. Marlou Sa is listed as on-call for hand, I contacted him and he stated he was not on-call for hand.  He suggested that I call another service.  According to amnion, it appears that Dr. Apolonio Schneiders is on call.  Page placed to Dr. Apolonio Schneiders.   [EW]    Clinical Course User Index [EW] Daleen Bo, MD     Patient Vitals for the past 24 hrs:  BP Temp Temp src Pulse SpO2 Height Weight  07/20/18 1435 (!) 181/82 - - - - - -  07/20/18 1425 - - - - - 5\' 11"  (1.803 m) 90.7 kg  07/20/18 1424 - 98.8 F (37.1 C) Oral (!) 103 94 % - -    At discharge- reevaluation with update and discussion. After initial assessment and treatment, an updated evaluation reveals he is comfortable and has no further complaints.  Findings discussed with the patient and wife, all questions were answered. Daleen Bo   Medical Decision Making: Fingertip injury with exposed bone, and tissue avulsion.  Patient will require operative management for closure, to prevent long-term complications.  Doubt tendon injury.  Doubt septic joint risk.  CRITICAL CARE-no Performed by: Daleen Bo  Nursing Notes Reviewed/ Care Coordinated Applicable Imaging Reviewed Interpretation of Laboratory Data incorporated into ED treatment  The patient appears reasonably screened and/or stabilized for discharge and I doubt any other medical condition or other [facility] requiring further screening, evaluation, or treatment in the ED at this time prior to discharge.  Plan: Home Medications-continue usual medicines; Home Treatments-keep dressing on until seeing hand surgery tomorrow; return here if the recommended treatment, does not improve the symptoms; Recommended follow up-hand surgery follow-up next day.   Final Clinical Impressions(s) / ED  Diagnoses   Final diagnoses:  Laceration of right thumb without foreign body with damage to nail, initial encounter  Open displaced fracture of distal phalanx of right thumb, initial encounter  ED Discharge Orders         Ordered    cephALEXin (KEFLEX) 500 MG capsule  4 times daily     07/20/18 1731    HYDROcodone-acetaminophen (NORCO) 5-325 MG tablet  Every 4 hours PRN     07/20/18 1731           Daleen Bo, MD 07/21/18 1334

## 2018-07-21 DIAGNOSIS — S62521B Displaced fracture of distal phalanx of right thumb, initial encounter for open fracture: Secondary | ICD-10-CM | POA: Diagnosis not present

## 2018-07-21 DIAGNOSIS — M79644 Pain in right finger(s): Secondary | ICD-10-CM | POA: Diagnosis not present

## 2018-07-22 DIAGNOSIS — X58XXXA Exposure to other specified factors, initial encounter: Secondary | ICD-10-CM | POA: Diagnosis not present

## 2018-07-22 DIAGNOSIS — Y999 Unspecified external cause status: Secondary | ICD-10-CM | POA: Diagnosis not present

## 2018-07-22 DIAGNOSIS — S62521B Displaced fracture of distal phalanx of right thumb, initial encounter for open fracture: Secondary | ICD-10-CM | POA: Diagnosis not present

## 2018-07-30 DIAGNOSIS — S62521D Displaced fracture of distal phalanx of right thumb, subsequent encounter for fracture with routine healing: Secondary | ICD-10-CM | POA: Diagnosis not present

## 2018-07-30 DIAGNOSIS — Z4789 Encounter for other orthopedic aftercare: Secondary | ICD-10-CM | POA: Diagnosis not present

## 2018-07-30 DIAGNOSIS — M79644 Pain in right finger(s): Secondary | ICD-10-CM | POA: Diagnosis not present

## 2018-07-31 DIAGNOSIS — L97522 Non-pressure chronic ulcer of other part of left foot with fat layer exposed: Secondary | ICD-10-CM | POA: Diagnosis not present

## 2018-08-10 DIAGNOSIS — Z6827 Body mass index (BMI) 27.0-27.9, adult: Secondary | ICD-10-CM | POA: Diagnosis not present

## 2018-08-10 DIAGNOSIS — Z299 Encounter for prophylactic measures, unspecified: Secondary | ICD-10-CM | POA: Diagnosis not present

## 2018-08-10 DIAGNOSIS — Z713 Dietary counseling and surveillance: Secondary | ICD-10-CM | POA: Diagnosis not present

## 2018-08-10 DIAGNOSIS — I10 Essential (primary) hypertension: Secondary | ICD-10-CM | POA: Diagnosis not present

## 2018-08-14 DIAGNOSIS — L97529 Non-pressure chronic ulcer of other part of left foot with unspecified severity: Secondary | ICD-10-CM | POA: Diagnosis not present

## 2018-09-04 DIAGNOSIS — L97529 Non-pressure chronic ulcer of other part of left foot with unspecified severity: Secondary | ICD-10-CM | POA: Diagnosis not present

## 2018-10-09 DIAGNOSIS — L97529 Non-pressure chronic ulcer of other part of left foot with unspecified severity: Secondary | ICD-10-CM | POA: Diagnosis not present

## 2018-11-10 DIAGNOSIS — Z713 Dietary counseling and surveillance: Secondary | ICD-10-CM | POA: Diagnosis not present

## 2018-11-10 DIAGNOSIS — I10 Essential (primary) hypertension: Secondary | ICD-10-CM | POA: Diagnosis not present

## 2018-11-10 DIAGNOSIS — E0590 Thyrotoxicosis, unspecified without thyrotoxic crisis or storm: Secondary | ICD-10-CM | POA: Diagnosis not present

## 2018-11-10 DIAGNOSIS — Z299 Encounter for prophylactic measures, unspecified: Secondary | ICD-10-CM | POA: Diagnosis not present

## 2018-11-10 DIAGNOSIS — Z6829 Body mass index (BMI) 29.0-29.9, adult: Secondary | ICD-10-CM | POA: Diagnosis not present

## 2018-11-11 DIAGNOSIS — Z713 Dietary counseling and surveillance: Secondary | ICD-10-CM | POA: Diagnosis not present

## 2018-11-11 DIAGNOSIS — Z299 Encounter for prophylactic measures, unspecified: Secondary | ICD-10-CM | POA: Diagnosis not present

## 2018-11-11 DIAGNOSIS — I10 Essential (primary) hypertension: Secondary | ICD-10-CM | POA: Diagnosis not present

## 2018-11-11 DIAGNOSIS — Z87891 Personal history of nicotine dependence: Secondary | ICD-10-CM | POA: Diagnosis not present

## 2018-11-11 DIAGNOSIS — Z6829 Body mass index (BMI) 29.0-29.9, adult: Secondary | ICD-10-CM | POA: Diagnosis not present

## 2018-11-13 DIAGNOSIS — L97529 Non-pressure chronic ulcer of other part of left foot with unspecified severity: Secondary | ICD-10-CM | POA: Diagnosis not present

## 2018-11-27 DIAGNOSIS — I10 Essential (primary) hypertension: Secondary | ICD-10-CM | POA: Diagnosis not present

## 2018-12-18 DIAGNOSIS — L97522 Non-pressure chronic ulcer of other part of left foot with fat layer exposed: Secondary | ICD-10-CM | POA: Diagnosis not present

## 2018-12-28 DIAGNOSIS — I10 Essential (primary) hypertension: Secondary | ICD-10-CM | POA: Diagnosis not present

## 2018-12-29 DIAGNOSIS — L97522 Non-pressure chronic ulcer of other part of left foot with fat layer exposed: Secondary | ICD-10-CM | POA: Diagnosis not present

## 2019-01-09 IMAGING — DX DG CHEST 2V
2 series · 2 of 2 positions shown · non-contrast
Comparison: None.

CLINICAL DATA: Productive cough.

EXAM:
CHEST - 2 VIEW

[chest pa]
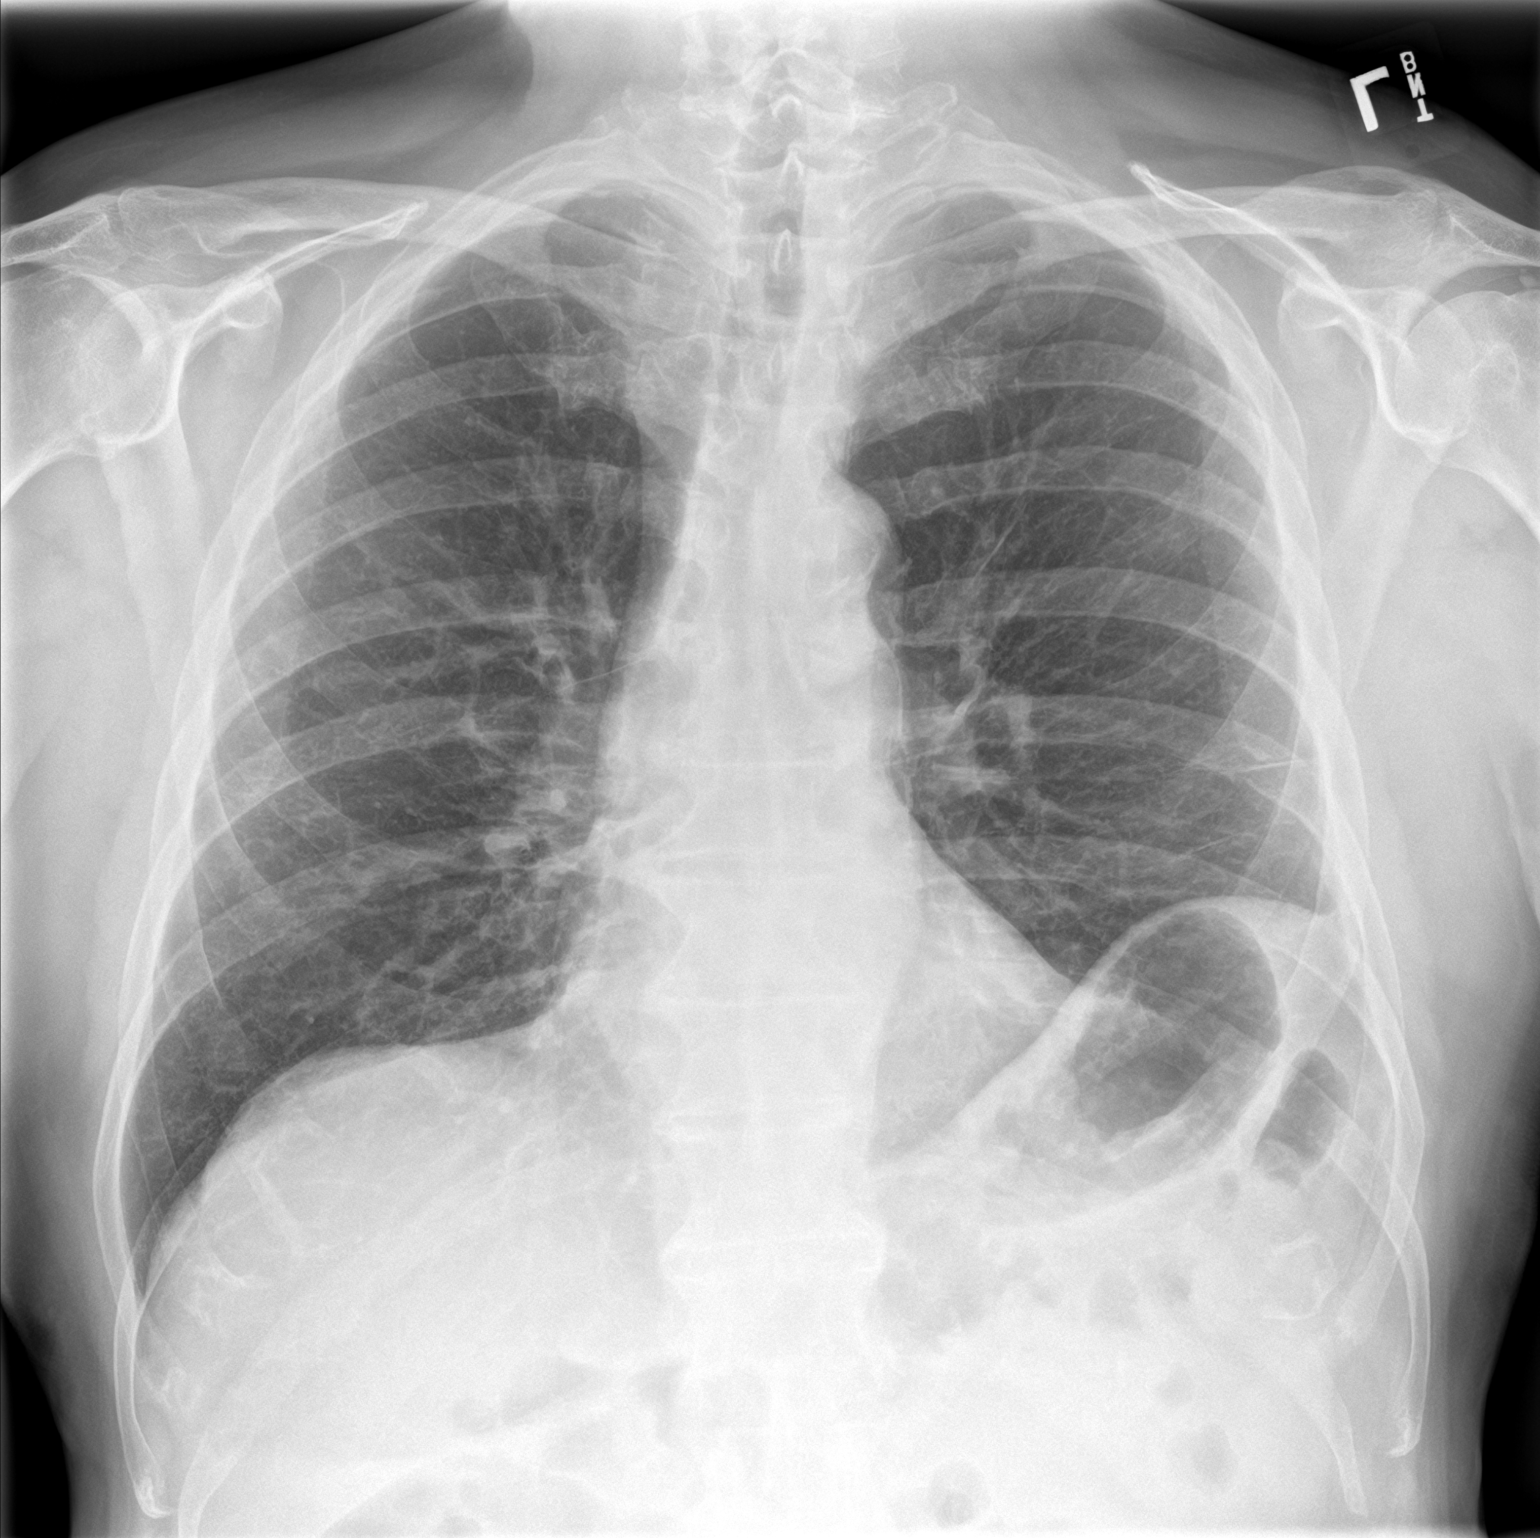

[chest lat]
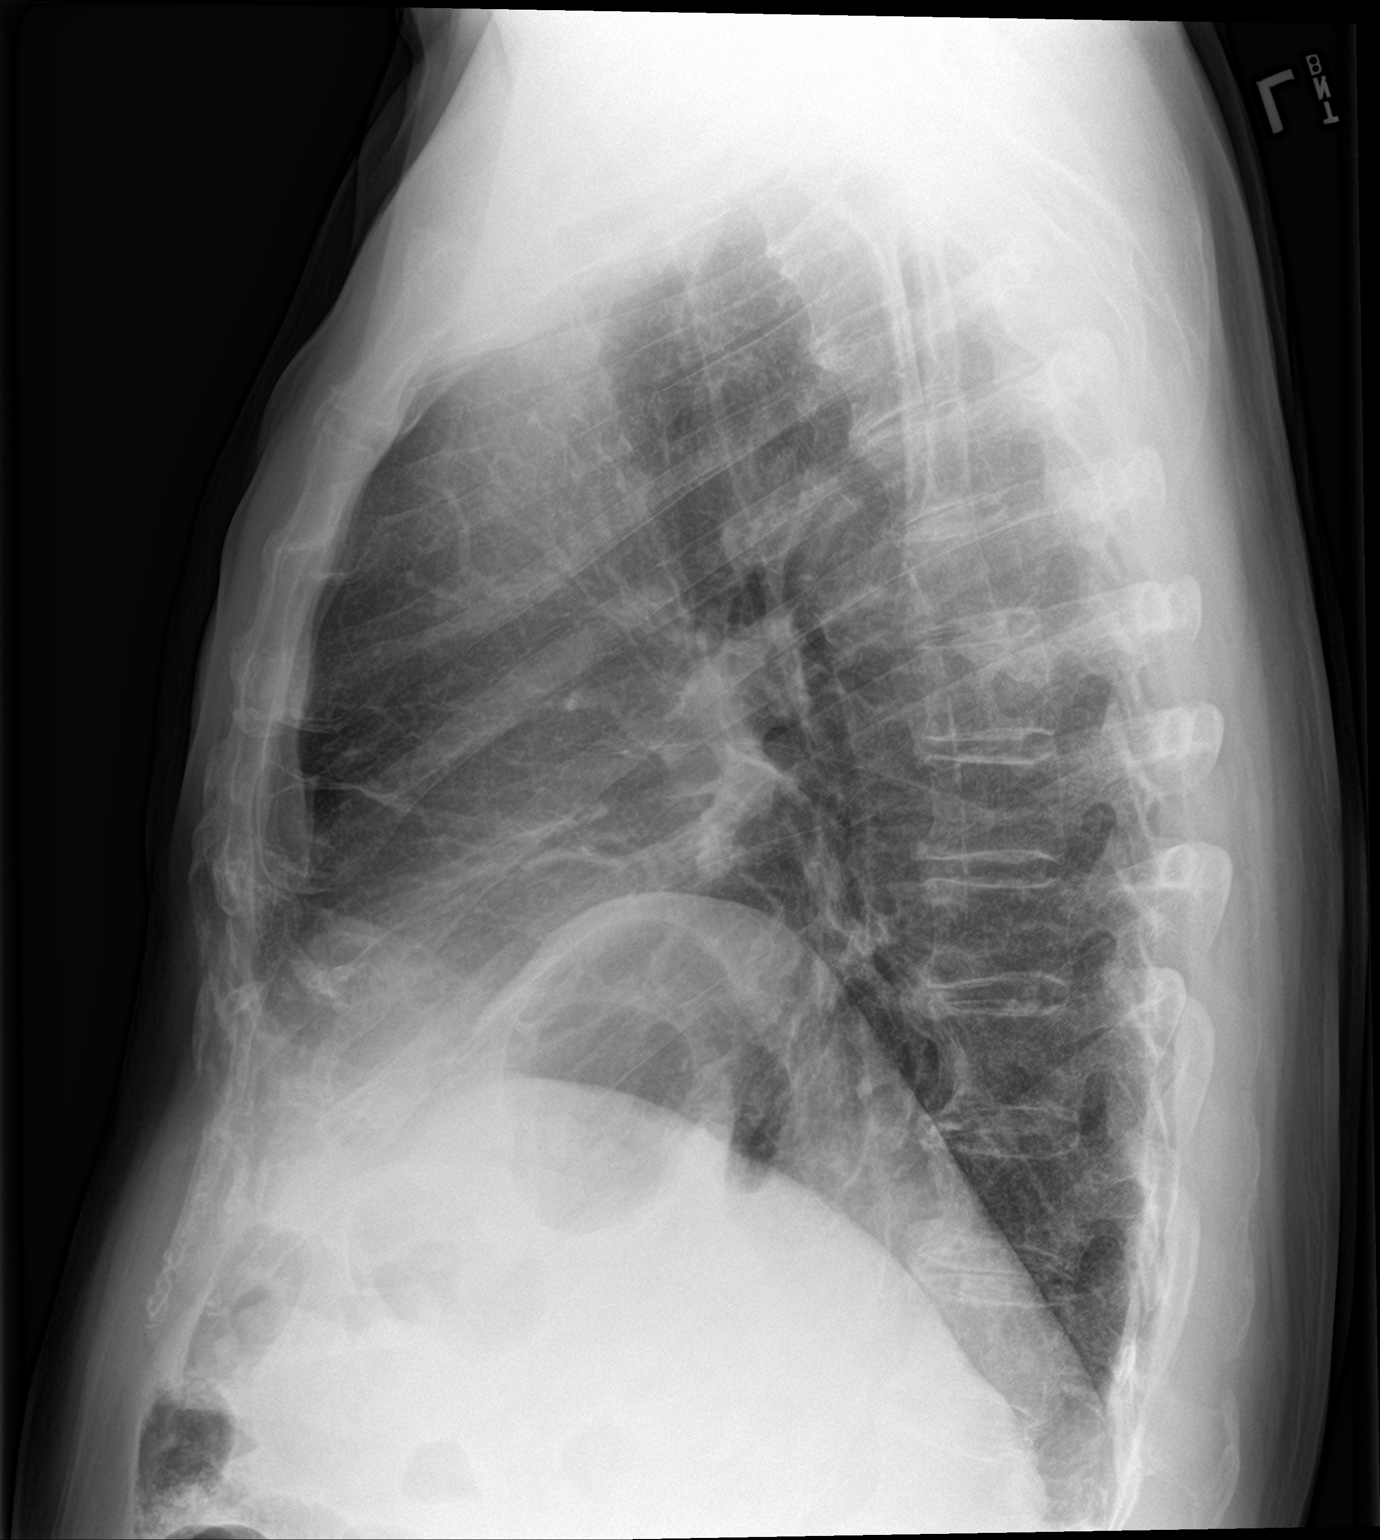

[2 of 2 positions shown; findings below may reference images not displayed]

FINDINGS: The heart size and mediastinal contours are within normal limits. No
pneumothorax or pleural effusion is noted. Elevated left
hemidiaphragm is noted. Minimal left basilar scarring is noted. No
acute pulmonary abnormality is noted. The visualized skeletal
structures are unremarkable.
IMPRESSION: No active cardiopulmonary disease.

## 2019-01-26 DIAGNOSIS — L97522 Non-pressure chronic ulcer of other part of left foot with fat layer exposed: Secondary | ICD-10-CM | POA: Diagnosis not present

## 2019-01-27 DIAGNOSIS — I10 Essential (primary) hypertension: Secondary | ICD-10-CM | POA: Diagnosis not present

## 2019-02-10 DIAGNOSIS — Z299 Encounter for prophylactic measures, unspecified: Secondary | ICD-10-CM | POA: Diagnosis not present

## 2019-02-10 DIAGNOSIS — Z713 Dietary counseling and surveillance: Secondary | ICD-10-CM | POA: Diagnosis not present

## 2019-02-10 DIAGNOSIS — Z6829 Body mass index (BMI) 29.0-29.9, adult: Secondary | ICD-10-CM | POA: Diagnosis not present

## 2019-02-10 DIAGNOSIS — I10 Essential (primary) hypertension: Secondary | ICD-10-CM | POA: Diagnosis not present

## 2019-02-16 DIAGNOSIS — L97522 Non-pressure chronic ulcer of other part of left foot with fat layer exposed: Secondary | ICD-10-CM | POA: Diagnosis not present

## 2019-03-01 DIAGNOSIS — L570 Actinic keratosis: Secondary | ICD-10-CM | POA: Diagnosis not present

## 2019-03-01 DIAGNOSIS — I10 Essential (primary) hypertension: Secondary | ICD-10-CM | POA: Diagnosis not present

## 2019-03-01 DIAGNOSIS — X32XXXD Exposure to sunlight, subsequent encounter: Secondary | ICD-10-CM | POA: Diagnosis not present

## 2019-03-01 DIAGNOSIS — D225 Melanocytic nevi of trunk: Secondary | ICD-10-CM | POA: Diagnosis not present

## 2019-03-01 DIAGNOSIS — L821 Other seborrheic keratosis: Secondary | ICD-10-CM | POA: Diagnosis not present

## 2019-03-01 DIAGNOSIS — C44619 Basal cell carcinoma of skin of left upper limb, including shoulder: Secondary | ICD-10-CM | POA: Diagnosis not present

## 2019-03-15 DIAGNOSIS — Z789 Other specified health status: Secondary | ICD-10-CM | POA: Diagnosis not present

## 2019-03-15 DIAGNOSIS — Z299 Encounter for prophylactic measures, unspecified: Secondary | ICD-10-CM | POA: Diagnosis not present

## 2019-03-15 DIAGNOSIS — Z6829 Body mass index (BMI) 29.0-29.9, adult: Secondary | ICD-10-CM | POA: Diagnosis not present

## 2019-03-15 DIAGNOSIS — I10 Essential (primary) hypertension: Secondary | ICD-10-CM | POA: Diagnosis not present

## 2019-03-15 DIAGNOSIS — H6980 Other specified disorders of Eustachian tube, unspecified ear: Secondary | ICD-10-CM | POA: Diagnosis not present

## 2019-03-18 DIAGNOSIS — R5383 Other fatigue: Secondary | ICD-10-CM | POA: Diagnosis not present

## 2019-03-18 DIAGNOSIS — Z7189 Other specified counseling: Secondary | ICD-10-CM | POA: Diagnosis not present

## 2019-03-18 DIAGNOSIS — I10 Essential (primary) hypertension: Secondary | ICD-10-CM | POA: Diagnosis not present

## 2019-03-18 DIAGNOSIS — Z0000 Encounter for general adult medical examination without abnormal findings: Secondary | ICD-10-CM | POA: Diagnosis not present

## 2019-03-18 DIAGNOSIS — E0590 Thyrotoxicosis, unspecified without thyrotoxic crisis or storm: Secondary | ICD-10-CM | POA: Diagnosis not present

## 2019-03-18 DIAGNOSIS — Z79899 Other long term (current) drug therapy: Secondary | ICD-10-CM | POA: Diagnosis not present

## 2019-03-18 DIAGNOSIS — Z299 Encounter for prophylactic measures, unspecified: Secondary | ICD-10-CM | POA: Diagnosis not present

## 2019-03-18 DIAGNOSIS — Z6829 Body mass index (BMI) 29.0-29.9, adult: Secondary | ICD-10-CM | POA: Diagnosis not present

## 2019-03-18 DIAGNOSIS — E782 Mixed hyperlipidemia: Secondary | ICD-10-CM | POA: Diagnosis not present

## 2019-03-18 DIAGNOSIS — Z1339 Encounter for screening examination for other mental health and behavioral disorders: Secondary | ICD-10-CM | POA: Diagnosis not present

## 2019-03-18 DIAGNOSIS — Z125 Encounter for screening for malignant neoplasm of prostate: Secondary | ICD-10-CM | POA: Diagnosis not present

## 2019-03-18 DIAGNOSIS — Z1331 Encounter for screening for depression: Secondary | ICD-10-CM | POA: Diagnosis not present

## 2019-03-18 DIAGNOSIS — Z1211 Encounter for screening for malignant neoplasm of colon: Secondary | ICD-10-CM | POA: Diagnosis not present

## 2019-03-19 DIAGNOSIS — L97522 Non-pressure chronic ulcer of other part of left foot with fat layer exposed: Secondary | ICD-10-CM | POA: Diagnosis not present

## 2019-03-26 DIAGNOSIS — Z713 Dietary counseling and surveillance: Secondary | ICD-10-CM | POA: Diagnosis not present

## 2019-03-26 DIAGNOSIS — I10 Essential (primary) hypertension: Secondary | ICD-10-CM | POA: Diagnosis not present

## 2019-03-26 DIAGNOSIS — H6980 Other specified disorders of Eustachian tube, unspecified ear: Secondary | ICD-10-CM | POA: Diagnosis not present

## 2019-03-26 DIAGNOSIS — Z6828 Body mass index (BMI) 28.0-28.9, adult: Secondary | ICD-10-CM | POA: Diagnosis not present

## 2019-03-26 DIAGNOSIS — Z299 Encounter for prophylactic measures, unspecified: Secondary | ICD-10-CM | POA: Diagnosis not present

## 2019-03-29 DIAGNOSIS — Z08 Encounter for follow-up examination after completed treatment for malignant neoplasm: Secondary | ICD-10-CM | POA: Diagnosis not present

## 2019-03-29 DIAGNOSIS — Z85828 Personal history of other malignant neoplasm of skin: Secondary | ICD-10-CM | POA: Diagnosis not present

## 2019-04-02 DIAGNOSIS — L97522 Non-pressure chronic ulcer of other part of left foot with fat layer exposed: Secondary | ICD-10-CM | POA: Diagnosis not present

## 2019-04-16 DIAGNOSIS — L97522 Non-pressure chronic ulcer of other part of left foot with fat layer exposed: Secondary | ICD-10-CM | POA: Diagnosis not present

## 2019-04-23 DIAGNOSIS — Z23 Encounter for immunization: Secondary | ICD-10-CM | POA: Diagnosis not present

## 2019-04-27 ENCOUNTER — Encounter (INDEPENDENT_AMBULATORY_CARE_PROVIDER_SITE_OTHER): Payer: Self-pay | Admitting: *Deleted

## 2019-04-27 DIAGNOSIS — I10 Essential (primary) hypertension: Secondary | ICD-10-CM | POA: Diagnosis not present

## 2019-04-30 DIAGNOSIS — L97522 Non-pressure chronic ulcer of other part of left foot with fat layer exposed: Secondary | ICD-10-CM | POA: Diagnosis not present

## 2019-05-12 ENCOUNTER — Other Ambulatory Visit: Payer: Self-pay | Admitting: Podiatry

## 2019-05-14 DIAGNOSIS — L97522 Non-pressure chronic ulcer of other part of left foot with fat layer exposed: Secondary | ICD-10-CM | POA: Diagnosis not present

## 2019-05-17 DIAGNOSIS — I10 Essential (primary) hypertension: Secondary | ICD-10-CM | POA: Diagnosis not present

## 2019-05-17 DIAGNOSIS — E663 Overweight: Secondary | ICD-10-CM | POA: Diagnosis not present

## 2019-05-17 DIAGNOSIS — Z713 Dietary counseling and surveillance: Secondary | ICD-10-CM | POA: Diagnosis not present

## 2019-05-17 DIAGNOSIS — Z299 Encounter for prophylactic measures, unspecified: Secondary | ICD-10-CM | POA: Diagnosis not present

## 2019-05-18 ENCOUNTER — Encounter (HOSPITAL_COMMUNITY): Admission: RE | Admit: 2019-05-18 | Payer: Medicare Other | Source: Ambulatory Visit

## 2019-05-18 ENCOUNTER — Other Ambulatory Visit (HOSPITAL_COMMUNITY): Admission: RE | Admit: 2019-05-18 | Payer: Medicare Other | Source: Ambulatory Visit

## 2019-05-19 ENCOUNTER — Ambulatory Visit: Admit: 2019-05-19 | Payer: Medicare Other | Admitting: Podiatry

## 2019-05-19 SURGERY — EXCISION, METATARSAL BONE, HEAD
Anesthesia: Monitor Anesthesia Care | Site: Toe | Laterality: Left

## 2019-06-01 DIAGNOSIS — L97522 Non-pressure chronic ulcer of other part of left foot with fat layer exposed: Secondary | ICD-10-CM | POA: Diagnosis not present

## 2019-06-07 ENCOUNTER — Ambulatory Visit (INDEPENDENT_AMBULATORY_CARE_PROVIDER_SITE_OTHER): Payer: Medicare Other | Admitting: Otolaryngology

## 2019-06-07 ENCOUNTER — Other Ambulatory Visit: Payer: Self-pay

## 2019-06-08 ENCOUNTER — Other Ambulatory Visit (HOSPITAL_COMMUNITY): Payer: Self-pay | Admitting: Otolaryngology

## 2019-06-08 ENCOUNTER — Other Ambulatory Visit: Payer: Self-pay | Admitting: Otolaryngology

## 2019-06-08 DIAGNOSIS — H918X9 Other specified hearing loss, unspecified ear: Secondary | ICD-10-CM

## 2019-06-15 ENCOUNTER — Ambulatory Visit (HOSPITAL_COMMUNITY)
Admission: RE | Admit: 2019-06-15 | Discharge: 2019-06-15 | Disposition: A | Discharge status: Home / Self Care | Payer: Medicare Other | Source: Ambulatory Visit | Attending: Otolaryngology | Admitting: Otolaryngology

## 2019-06-15 ENCOUNTER — Other Ambulatory Visit: Payer: Self-pay

## 2019-06-15 DIAGNOSIS — H918X9 Other specified hearing loss, unspecified ear: Secondary | ICD-10-CM | POA: Diagnosis present

## 2019-06-15 LAB — POCT I-STAT CREATININE: Creatinine, Ser: 1.4 mg/dL — ABNORMAL HIGH (ref 0.61–1.24)

## 2019-06-15 MED ORDER — GADOBUTROL 1 MMOL/ML IV SOLN
10.0000 mL | Freq: Once | INTRAVENOUS | Status: AC | PRN
  Administered 2019-06-15: 10 mL via INTRAVENOUS

## 2019-06-18 DIAGNOSIS — I10 Essential (primary) hypertension: Secondary | ICD-10-CM | POA: Diagnosis not present

## 2019-06-21 ENCOUNTER — Other Ambulatory Visit (INDEPENDENT_AMBULATORY_CARE_PROVIDER_SITE_OTHER): Payer: Self-pay | Admitting: *Deleted

## 2019-06-21 DIAGNOSIS — Z86010 Personal history of colonic polyps: Secondary | ICD-10-CM | POA: Insufficient documentation

## 2019-07-05 DIAGNOSIS — H838X3 Other specified diseases of inner ear, bilateral: Secondary | ICD-10-CM | POA: Diagnosis not present

## 2019-07-05 DIAGNOSIS — H903 Sensorineural hearing loss, bilateral: Secondary | ICD-10-CM | POA: Diagnosis not present

## 2019-07-19 DIAGNOSIS — Z299 Encounter for prophylactic measures, unspecified: Secondary | ICD-10-CM | POA: Diagnosis not present

## 2019-07-19 DIAGNOSIS — E782 Mixed hyperlipidemia: Secondary | ICD-10-CM | POA: Diagnosis not present

## 2019-07-19 DIAGNOSIS — Z6829 Body mass index (BMI) 29.0-29.9, adult: Secondary | ICD-10-CM | POA: Diagnosis not present

## 2019-07-19 DIAGNOSIS — L97522 Non-pressure chronic ulcer of other part of left foot with fat layer exposed: Secondary | ICD-10-CM | POA: Diagnosis not present

## 2019-07-19 DIAGNOSIS — M216X2 Other acquired deformities of left foot: Secondary | ICD-10-CM | POA: Diagnosis not present

## 2019-07-19 DIAGNOSIS — Z713 Dietary counseling and surveillance: Secondary | ICD-10-CM | POA: Diagnosis not present

## 2019-07-19 DIAGNOSIS — I10 Essential (primary) hypertension: Secondary | ICD-10-CM | POA: Diagnosis not present

## 2019-07-22 DIAGNOSIS — H2513 Age-related nuclear cataract, bilateral: Secondary | ICD-10-CM | POA: Diagnosis not present

## 2019-07-22 DIAGNOSIS — H353132 Nonexudative age-related macular degeneration, bilateral, intermediate dry stage: Secondary | ICD-10-CM | POA: Diagnosis not present

## 2019-07-26 ENCOUNTER — Other Ambulatory Visit (HOSPITAL_COMMUNITY): Payer: Medicare Other

## 2019-07-26 DIAGNOSIS — I10 Essential (primary) hypertension: Secondary | ICD-10-CM | POA: Diagnosis not present

## 2019-08-03 DIAGNOSIS — L97512 Non-pressure chronic ulcer of other part of right foot with fat layer exposed: Secondary | ICD-10-CM | POA: Diagnosis not present

## 2019-08-03 DIAGNOSIS — L97522 Non-pressure chronic ulcer of other part of left foot with fat layer exposed: Secondary | ICD-10-CM | POA: Diagnosis not present

## 2019-08-12 DIAGNOSIS — Z23 Encounter for immunization: Secondary | ICD-10-CM | POA: Diagnosis not present

## 2019-08-15 ENCOUNTER — Ambulatory Visit: Payer: Medicare Other

## 2019-08-20 DIAGNOSIS — L97522 Non-pressure chronic ulcer of other part of left foot with fat layer exposed: Secondary | ICD-10-CM | POA: Diagnosis not present

## 2019-08-20 NOTE — Patient Instructions (Signed)
Carl Young  08/20/2019     @PREFPERIOPPHARMACY @   Your procedure is scheduled on  08/25/2019.  Report to [facility] at  0700  A.M.  Call this number if you have problems the morning of surgery:  [phone]   Remember:  Do not eat or drink after midnight.                       Take these medicines the morning of surgery with A SIP OF WATER  Amlodipine, lotensin, chlorthalidone, metoprolol.    Do not wear jewelry, make-up or nail polish.  Do not wear lotions, powders, or perfumes. Please brush your teeth and wear deodorant.  Do not shave 48 hours prior to surgery.  Men may shave face and neck.  Do not bring valuables to the hospital.  [facility] is not responsible for any belongings or valuables.  Contacts, dentures or bridgework may not be worn into surgery.  Leave your suitcase in the car.  After surgery it may be brought to your room.  For patients admitted to the hospital, discharge time will be determined by your treatment team.  Patients discharged the day of surgery will not be allowed to drive home.   Name and phone number of your driver:   family Special instructions:  DO NOT smoke the morning of your procedure.  Please read over the following fact sheets that you were given. Anesthesia Post-op Instructions and Care and Recovery After Surgery       Incision Care, Adult An incision is a cut that a doctor makes in your skin for surgery (for a procedure). Most times, these cuts are closed after surgery. Your cut from surgery may be closed with stitches (sutures), staples, skin glue, or skin tape (adhesive strips). You may need to return to your doctor to have stitches or staples taken out. This may happen many days or many weeks after your surgery. The cut needs to be well cared for so it does not get infected. How to care for your cut Cut care   Follow instructions from your doctor about how to take care of your cut. Make sure you: ? Wash your hands  with soap and water before you change your bandage (dressing). If you cannot use soap and water, use hand sanitizer. ? Change your bandage as told by your doctor. ? Leave stitches, skin glue, or skin tape in place. They may need to stay in place for 2 weeks or longer. If tape strips get loose and curl up, you may trim the loose edges. Do not remove tape strips completely unless your doctor says it is okay.  Check your cut area every day for signs of infection. Check for: ? More redness, swelling, or pain. ? More fluid or blood. ? Warmth. ? Pus or a bad smell.  Ask your doctor how to clean the cut. This may include: ? Using mild soap and water. ? Using a clean towel to pat the cut dry after you clean it. ? Putting a cream or ointment on the cut. Do this only as told by your doctor. ? Covering the cut with a clean bandage.  Ask your doctor when you can leave the cut uncovered.  Do not take baths, swim, or use a hot tub until your doctor says it is okay. Ask your doctor if you can take showers. You may only be allowed to take sponge baths for bathing.  Medicines  If you were prescribed an antibiotic medicine, cream, or ointment, take the antibiotic or put it on the cut as told by your doctor. Do not stop taking or putting on the antibiotic even if your condition gets better.  Take over-the-counter and prescription medicines only as told by your doctor. General instructions  Limit movement around your cut. This helps healing. ? Avoid straining, lifting, or exercise for the first month, or for as long as told by your doctor. ? Follow instructions from your doctor about going back to your normal activities. ? Ask your doctor what activities are safe.  Protect your cut from the sun when you are outside for the first 6 months, or for as long as told by your doctor. Put on sunscreen around the scar or cover up the scar.  Keep all follow-up visits as told by your doctor. This is  important. Contact a doctor if:  Your have more redness, swelling, or pain around the cut.  You have more fluid or blood coming from the cut.  Your cut feels warm to the touch.  You have pus or a bad smell coming from the cut.  You have a fever or shaking chills.  You feel sick to your stomach (nauseous) or you throw up (vomit).  You are dizzy.  Your stitches or staples come undone. Get help right away if:  You have a red streak coming from your cut.  Your cut bleeds through the bandage and the bleeding does not stop with gentle pressure.  The edges of your cut open up and separate.  You have very bad (severe) pain.  You have a rash.  You are confused.  You pass out (faint).  You have trouble breathing and you have a fast heartbeat. This information is not intended to replace advice given to you by your health care provider. Make sure you discuss any questions you have with your health care provider. Document Revised: 11/04/2016 Document Reviewed: 02/23/2016 Elsevier Patient Education  [facility]. How to Use Chlorhexidine for Bathing Chlorhexidine gluconate (CHG) is a germ-killing (antiseptic) solution that is used to clean the skin. It can get rid of the bacteria that normally live on the skin and can keep them away for about 24 hours. To clean your skin with CHG, you may be given:  A CHG solution to use in the shower or as part of a sponge bath.  A prepackaged cloth that contains CHG. Cleaning your skin with CHG may help lower the risk for infection:  While you are staying in the intensive care unit of the hospital.  If you have a vascular access, such as a central line, to provide short-term or long-term access to your veins.  If you have a catheter to drain urine from your bladder.  If you are on a ventilator. A ventilator is a machine that helps you breathe by moving air in and out of your lungs.  After surgery. What are the risks? Risks of using  CHG include:  A skin reaction.  Hearing loss, if CHG gets in your ears.  Eye injury, if CHG gets in your eyes and is not rinsed out.  The CHG product catching fire. Make sure that you avoid smoking and flames after applying CHG to your skin. Do not use CHG:  If you have a chlorhexidine allergy or have previously reacted to chlorhexidine.  On babies younger than 68 months of age. How to use CHG solution  Use CHG  only as told by your health care provider, and follow the instructions on the label.  Use the full amount of CHG as directed. Usually, this is one bottle. During a shower Follow these steps when using CHG solution during a shower (unless your health care provider gives you different instructions): 1. Start the shower. 2. Use your normal soap and shampoo to wash your face and hair. 3. Turn off the shower or move out of the shower stream. 4. Pour the CHG onto a clean washcloth. Do not use any type of brush or rough-edged sponge. 5. Starting at your neck, lather your body down to your toes. Make sure you follow these instructions: ? If you will be having surgery, pay special attention to the part of your body where you will be having surgery. Scrub this area for at least 1 minute. ? Do not use CHG on your head or face. If the solution gets into your ears or eyes, rinse them well with water. ? Avoid your genital area. ? Avoid any areas of skin that have broken skin, cuts, or scrapes. ? Scrub your back and under your arms. Make sure to wash skin folds. 6. Let the lather sit on your skin for 1-2 minutes or as long as told by your health care provider. 7. Thoroughly rinse your entire body in the shower. Make sure that all body creases and crevices are rinsed well. 8. Dry off with a clean towel. Do not put any substances on your body afterward--such as powder, lotion, or perfume--unless you are told to do so by your health care provider. Only use lotions that are recommended by the  manufacturer. 9. Put on clean clothes or pajamas. 10. If it is the night before your surgery, sleep in clean sheets.  During a sponge bath Follow these steps when using CHG solution during a sponge bath (unless your health care provider gives you different instructions): 1. Use your normal soap and shampoo to wash your face and hair. 2. Pour the CHG onto a clean washcloth. 3. Starting at your neck, lather your body down to your toes. Make sure you follow these instructions: ? If you will be having surgery, pay special attention to the part of your body where you will be having surgery. Scrub this area for at least 1 minute. ? Do not use CHG on your head or face. If the solution gets into your ears or eyes, rinse them well with water. ? Avoid your genital area. ? Avoid any areas of skin that have broken skin, cuts, or scrapes. ? Scrub your back and under your arms. Make sure to wash skin folds. 4. Let the lather sit on your skin for 1-2 minutes or as long as told by your health care provider. 5. Using a different clean, wet washcloth, thoroughly rinse your entire body. Make sure that all body creases and crevices are rinsed well. 6. Dry off with a clean towel. Do not put any substances on your body afterward--such as powder, lotion, or perfume--unless you are told to do so by your health care provider. Only use lotions that are recommended by the manufacturer. 7. Put on clean clothes or pajamas. 8. If it is the night before your surgery, sleep in clean sheets. How to use CHG prepackaged cloths  Only use CHG cloths as told by your health care provider, and follow the instructions on the label.  Use the CHG cloth on clean, dry skin.  Do not use the CHG  cloth on your head or face unless your health care provider tells you to.  When washing with the CHG cloth: ? Avoid your genital area. ? Avoid any areas of skin that have broken skin, cuts, or scrapes. Before surgery Follow these steps when  using a CHG cloth to clean before surgery (unless your health care provider gives you different instructions): 1. Using the CHG cloth, vigorously scrub the part of your body where you will be having surgery. Scrub using a back-and-forth motion for 3 minutes. The area on your body should be completely wet with CHG when you are done scrubbing. 2. Do not rinse. Discard the cloth and let the area air-dry. Do not put any substances on the area afterward, such as powder, lotion, or perfume. 3. Put on clean clothes or pajamas. 4. If it is the night before your surgery, sleep in clean sheets.  For general bathing Follow these steps when using CHG cloths for general bathing (unless your health care provider gives you different instructions). 1. Use a separate CHG cloth for each area of your body. Make sure you wash between any folds of skin and between your fingers and toes. Wash your body in the following order, switching to a new cloth after each step: ? The front of your neck, shoulders, and chest. ? Both of your arms, under your arms, and your hands. ? Your stomach and groin area, avoiding the genitals. ? Your right leg and foot. ? Your left leg and foot. ? The back of your neck, your back, and your buttocks. 2. Do not rinse. Discard the cloth and let the area air-dry. Do not put any substances on your body afterward--such as powder, lotion, or perfume--unless you are told to do so by your health care provider. Only use lotions that are recommended by the manufacturer. 3. Put on clean clothes or pajamas. Contact a health care provider if:  Your skin gets irritated after scrubbing.  You have questions about using your solution or cloth. Get help right away if:  Your eyes become very red or swollen.  Your eyes itch badly.  Your skin itches badly and is red or swollen.  Your hearing changes.  You have trouble seeing.  You have swelling or tingling in your mouth or throat.  You have  trouble breathing.  You swallow any chlorhexidine. Summary  Chlorhexidine gluconate (CHG) is a germ-killing (antiseptic) solution that is used to clean the skin. Cleaning your skin with CHG may help to lower your risk for infection.  You may be given CHG to use for bathing. It may be in a bottle or in a prepackaged cloth to use on your skin. Carefully follow your health care provider's instructions and the instructions on the product label.  Do not use CHG if you have a chlorhexidine allergy.  Contact your health care provider if your skin gets irritated after scrubbing. This information is not intended to replace advice given to you by your health care provider. Make sure you discuss any questions you have with your health care provider. Document Revised: 09/03/2018 Document Reviewed: 05/15/2017 Elsevier Patient Education  [place].

## 2019-08-23 ENCOUNTER — Other Ambulatory Visit (HOSPITAL_COMMUNITY)
Admission: RE | Admit: 2019-08-23 | Discharge: 2019-08-23 | Disposition: A | Discharge status: Home / Self Care | Payer: Medicare Other | Source: Ambulatory Visit | Attending: Podiatry | Admitting: Podiatry

## 2019-08-23 ENCOUNTER — Encounter (HOSPITAL_COMMUNITY)
Admission: RE | Admit: 2019-08-23 | Discharge: 2019-08-23 | Disposition: A | Discharge status: Home / Self Care | Payer: Medicare Other | Source: Ambulatory Visit | Attending: Podiatry | Admitting: Podiatry

## 2019-08-23 ENCOUNTER — Ambulatory Visit (HOSPITAL_COMMUNITY)
Admission: RE | Admit: 2019-08-23 | Discharge: 2019-08-23 | Disposition: A | Discharge status: Home / Self Care | Payer: Medicare Other | Source: Ambulatory Visit | Attending: Podiatry | Admitting: Podiatry

## 2019-08-23 ENCOUNTER — Other Ambulatory Visit (HOSPITAL_COMMUNITY): Payer: Self-pay | Admitting: Podiatry

## 2019-08-23 ENCOUNTER — Ambulatory Visit (HOSPITAL_COMMUNITY): Admission: RE | Admit: 2019-08-23 | Payer: Medicare Other | Source: Ambulatory Visit

## 2019-08-23 ENCOUNTER — Other Ambulatory Visit: Payer: Self-pay

## 2019-08-23 DIAGNOSIS — Z01812 Encounter for preprocedural laboratory examination: Secondary | ICD-10-CM | POA: Insufficient documentation

## 2019-08-23 DIAGNOSIS — L97529 Non-pressure chronic ulcer of other part of left foot with unspecified severity: Secondary | ICD-10-CM

## 2019-08-23 DIAGNOSIS — Z20822 Contact with and (suspected) exposure to covid-19: Secondary | ICD-10-CM | POA: Insufficient documentation

## 2019-08-23 DIAGNOSIS — M19072 Primary osteoarthritis, left ankle and foot: Secondary | ICD-10-CM | POA: Insufficient documentation

## 2019-08-23 DIAGNOSIS — Z01818 Encounter for other preprocedural examination: Secondary | ICD-10-CM | POA: Diagnosis not present

## 2019-08-23 DIAGNOSIS — Z01810 Encounter for preprocedural cardiovascular examination: Secondary | ICD-10-CM | POA: Diagnosis not present

## 2019-08-23 LAB — CBC WITH DIFFERENTIAL/PLATELET
Abs Immature Granulocytes: 0.01 10*3/uL (ref 0.00–0.07)
Basophils Absolute: 0 10*3/uL (ref 0.0–0.1)
Basophils Relative: 0 %
Eosinophils Absolute: 0 10*3/uL (ref 0.0–0.5)
Eosinophils Relative: 1 %
HCT: 41.7 % (ref 39.0–52.0)
Hemoglobin: 13.2 g/dL (ref 13.0–17.0)
Immature Granulocytes: 0 %
Lymphocytes Relative: 29 %
Lymphs Abs: 1.6 10*3/uL (ref 0.7–4.0)
MCH: 31.1 pg (ref 26.0–34.0)
MCHC: 31.7 g/dL (ref 30.0–36.0)
MCV: 98.1 fL (ref 80.0–100.0)
Monocytes Absolute: 0.6 10*3/uL (ref 0.1–1.0)
Monocytes Relative: 11 %
Neutro Abs: 3.3 10*3/uL (ref 1.7–7.7)
Neutrophils Relative %: 59 %
Platelets: 161 10*3/uL (ref 150–400)
RBC: 4.25 MIL/uL (ref 4.22–5.81)
RDW: 14.5 % (ref 11.5–15.5)
WBC: 5.7 10*3/uL (ref 4.0–10.5)
nRBC: 0 % (ref 0.0–0.2)

## 2019-08-23 LAB — BASIC METABOLIC PANEL
Anion gap: 11 (ref 5–15)
BUN: 28 mg/dL — ABNORMAL HIGH (ref 8–23)
CO2: 27 mmol/L (ref 22–32)
Calcium: 9.5 mg/dL (ref 8.9–10.3)
Chloride: 100 mmol/L (ref 98–111)
Creatinine, Ser: 1.4 mg/dL — ABNORMAL HIGH (ref 0.61–1.24)
GFR calc Af Amer: 54 mL/min — ABNORMAL LOW (ref >60)
GFR calc non Af Amer: 47 mL/min — ABNORMAL LOW (ref >60)
Glucose, Bld: 99 mg/dL (ref 70–99)
Potassium: 4.3 mmol/L (ref 3.5–5.1)
Sodium: 138 mmol/L (ref 135–145)

## 2019-08-23 LAB — SARS CORONAVIRUS 2 (TAT 6-24 HRS): SARS Coronavirus 2: NEGATIVE

## 2019-08-25 ENCOUNTER — Encounter (HOSPITAL_COMMUNITY)
Admission: RE | Disposition: A | Discharge status: Home / Self Care | Payer: Self-pay | Source: Home / Self Care | Attending: Podiatry

## 2019-08-25 ENCOUNTER — Ambulatory Visit (HOSPITAL_COMMUNITY): Payer: Medicare Other | Admitting: Anesthesiology

## 2019-08-25 ENCOUNTER — Ambulatory Visit (HOSPITAL_COMMUNITY): Payer: Medicare Other

## 2019-08-25 ENCOUNTER — Ambulatory Visit (HOSPITAL_COMMUNITY)
Admission: RE | Admit: 2019-08-25 | Discharge: 2019-08-25 | Disposition: A | Discharge status: Home / Self Care | Payer: Medicare Other | Attending: Podiatry | Admitting: Podiatry

## 2019-08-25 DIAGNOSIS — G629 Polyneuropathy, unspecified: Secondary | ICD-10-CM | POA: Insufficient documentation

## 2019-08-25 DIAGNOSIS — Z7982 Long term (current) use of aspirin: Secondary | ICD-10-CM | POA: Diagnosis not present

## 2019-08-25 DIAGNOSIS — L97529 Non-pressure chronic ulcer of other part of left foot with unspecified severity: Secondary | ICD-10-CM | POA: Diagnosis not present

## 2019-08-25 DIAGNOSIS — E785 Hyperlipidemia, unspecified: Secondary | ICD-10-CM | POA: Diagnosis not present

## 2019-08-25 DIAGNOSIS — Z79899 Other long term (current) drug therapy: Secondary | ICD-10-CM | POA: Insufficient documentation

## 2019-08-25 DIAGNOSIS — L97522 Non-pressure chronic ulcer of other part of left foot with fat layer exposed: Secondary | ICD-10-CM | POA: Insufficient documentation

## 2019-08-25 DIAGNOSIS — M19072 Primary osteoarthritis, left ankle and foot: Secondary | ICD-10-CM | POA: Diagnosis not present

## 2019-08-25 DIAGNOSIS — Z9889 Other specified postprocedural states: Secondary | ICD-10-CM

## 2019-08-25 DIAGNOSIS — I10 Essential (primary) hypertension: Secondary | ICD-10-CM | POA: Diagnosis not present

## 2019-08-25 DIAGNOSIS — M899 Disorder of bone, unspecified: Secondary | ICD-10-CM | POA: Diagnosis not present

## 2019-08-25 DIAGNOSIS — M216X2 Other acquired deformities of left foot: Secondary | ICD-10-CM | POA: Diagnosis not present

## 2019-08-25 DIAGNOSIS — M21272 Flexion deformity, left ankle and toes: Secondary | ICD-10-CM | POA: Diagnosis not present

## 2019-08-25 HISTORY — PX: METATARSAL HEAD EXCISION: SHX5027

## 2019-08-25 SURGERY — EXCISION, METATARSAL BONE, HEAD
Anesthesia: Monitor Anesthesia Care | Site: Toe | Laterality: Left

## 2019-08-25 MED ORDER — FENTANYL CITRATE (PF) 100 MCG/2ML IJ SOLN
INTRAMUSCULAR | Status: DC | PRN
  Administered 2019-08-25: 25 ug via INTRAVENOUS

## 2019-08-25 MED ORDER — FENTANYL CITRATE (PF) 100 MCG/2ML IJ SOLN
INTRAMUSCULAR | Status: AC
  Filled 2019-08-25: qty 2

## 2019-08-25 MED ORDER — PROMETHAZINE HCL 25 MG/ML IJ SOLN: 6.2500 mg | INTRAMUSCULAR | Status: DC | PRN

## 2019-08-25 MED ORDER — PROPOFOL 500 MG/50ML IV EMUL
INTRAVENOUS | Status: DC | PRN
  Administered 2019-08-25: 50 ug/kg/min via INTRAVENOUS

## 2019-08-25 MED ORDER — PROPOFOL 10 MG/ML IV BOLUS
INTRAVENOUS | Status: AC
  Filled 2019-08-25: qty 40

## 2019-08-25 MED ORDER — PROPOFOL 10 MG/ML IV BOLUS
INTRAVENOUS | Status: DC | PRN
  Administered 2019-08-25: 40 mg via INTRAVENOUS
  Administered 2019-08-25 (×2): 20 mg via INTRAVENOUS

## 2019-08-25 MED ORDER — BUPIVACAINE HCL (PF) 0.5 % IJ SOLN
INTRAMUSCULAR | Status: DC | PRN
  Administered 2019-08-25: 17 mL

## 2019-08-25 MED ORDER — HYDROCODONE-ACETAMINOPHEN 7.5-325 MG PO TABS: 1.0000 | ORAL_TABLET | Freq: Once | ORAL | Status: DC | PRN

## 2019-08-25 MED ORDER — 0.9 % SODIUM CHLORIDE (POUR BTL) OPTIME
TOPICAL | Status: DC | PRN
  Administered 2019-08-25: 1000 mL

## 2019-08-25 MED ORDER — HYDROMORPHONE HCL 1 MG/ML IJ SOLN: 0.2500 mg | INTRAMUSCULAR | Status: DC | PRN

## 2019-08-25 MED ORDER — CEFAZOLIN SODIUM-DEXTROSE 2-4 GM/100ML-% IV SOLN
INTRAVENOUS | Status: AC
  Filled 2019-08-25: qty 100

## 2019-08-25 MED ORDER — LACTATED RINGERS IV SOLN: INTRAVENOUS | Status: DC

## 2019-08-25 MED ORDER — BUPIVACAINE HCL (PF) 0.5 % IJ SOLN
INTRAMUSCULAR | Status: AC
  Filled 2019-08-25: qty 30

## 2019-08-25 MED ORDER — CEFAZOLIN SODIUM-DEXTROSE 2-4 GM/100ML-% IV SOLN
2.0000 g | Freq: Once | INTRAVENOUS | Status: AC
  Administered 2019-08-25: 09:00:00 2 g via INTRAVENOUS

## 2019-08-25 MED ORDER — MIDAZOLAM HCL 2 MG/2ML IJ SOLN: 0.5000 mg | Freq: Once | INTRAMUSCULAR | Status: DC | PRN

## 2019-08-25 SURGICAL SUPPLY — 49 items
BANDAGE ELASTIC 4 VELCRO NS (GAUZE/BANDAGES/DRESSINGS) ×2 IMPLANT
BANDAGE ESMARK 4X12 BL STRL LF (DISPOSABLE) ×1 IMPLANT
BENZOIN TINCTURE PRP APPL 2/3 (GAUZE/BANDAGES/DRESSINGS) ×3 IMPLANT
BLADE AVERAGE 25MMX9MM (BLADE) ×1
BLADE AVERAGE 25X9 (BLADE) ×2 IMPLANT
BLADE OSC/SAGITTAL MD 9X18.5 (BLADE) IMPLANT
BLADE SURG 15 STRL LF DISP TIS (BLADE) ×1 IMPLANT
BLADE SURG 15 STRL SS (BLADE) ×2
BNDG CONFORM 2 STRL LF (GAUZE/BANDAGES/DRESSINGS) ×2 IMPLANT
BNDG ELASTIC 4X5.8 VLCR NS LF (GAUZE/BANDAGES/DRESSINGS) ×3 IMPLANT
BNDG ESMARK 4X12 BLUE STRL LF (DISPOSABLE) ×3
BNDG GAUZE ELAST 4 BULKY (GAUZE/BANDAGES/DRESSINGS) ×3 IMPLANT
CLOSURE WOUND 1/2 X4 (GAUZE/BANDAGES/DRESSINGS) ×1
CLOTH BEACON ORANGE TIMEOUT ST (SAFETY) ×3 IMPLANT
COVER LIGHT HANDLE STERIS (MISCELLANEOUS) ×6 IMPLANT
COVER WAND RF STERILE (DRAPES) ×3 IMPLANT
CUFF TOURN SGL QUICK 18X4 (TOURNIQUET CUFF) ×3 IMPLANT
DECANTER SPIKE VIAL GLASS SM (MISCELLANEOUS) ×3 IMPLANT
DRAPE OEC MINIVIEW 54X84 (DRAPES) ×3 IMPLANT
DRSG ADAPTIC 3X8 NADH LF (GAUZE/BANDAGES/DRESSINGS) ×3 IMPLANT
ELECT REM PT RETURN 9FT ADLT (ELECTROSURGICAL) ×3
ELECTRODE REM PT RTRN 9FT ADLT (ELECTROSURGICAL) ×1 IMPLANT
GAUZE SPONGE 4X4 12PLY STRL (GAUZE/BANDAGES/DRESSINGS) ×3 IMPLANT
GLOVE BIO SURGEON STRL SZ7 (GLOVE) ×2 IMPLANT
GLOVE BIO SURGEON STRL SZ7.5 (GLOVE) ×3 IMPLANT
GLOVE BIOGEL PI IND STRL 7.0 (GLOVE) ×2 IMPLANT
GLOVE BIOGEL PI IND STRL 7.5 (GLOVE) ×1 IMPLANT
GLOVE BIOGEL PI INDICATOR 7.0 (GLOVE) ×4
GLOVE BIOGEL PI INDICATOR 7.5 (GLOVE) ×2
GLOVE ECLIPSE 7.0 STRL STRAW (GLOVE) ×3 IMPLANT
GOWN STRL REUS W/ TWL LRG LVL3 (GOWN DISPOSABLE) ×1 IMPLANT
GOWN STRL REUS W/TWL LRG LVL3 (GOWN DISPOSABLE) ×8 IMPLANT
K-WIRE 6 (WIRE)
KIT TURNOVER KIT A (KITS) ×3 IMPLANT
KWIRE 6 (WIRE) IMPLANT
MANIFOLD NEPTUNE II (INSTRUMENTS) ×3 IMPLANT
MARKER SKIN DUAL TIP RULER LAB (MISCELLANEOUS) ×3 IMPLANT
NDL HYPO 27GX1-1/4 (NEEDLE) ×2 IMPLANT
NEEDLE HYPO 27GX1-1/4 (NEEDLE) ×6 IMPLANT
NS IRRIG 1000ML POUR BTL (IV SOLUTION) ×3 IMPLANT
PACK BASIC LIMB (CUSTOM PROCEDURE TRAY) ×3 IMPLANT
PAD ARMBOARD 7.5X6 YLW CONV (MISCELLANEOUS) ×3 IMPLANT
PENCIL SMOKE EVACUATOR (MISCELLANEOUS) ×2 IMPLANT
SET BASIN LINEN APH (SET/KITS/TRAYS/PACK) ×3 IMPLANT
SPONGE LAP 18X18 RF (DISPOSABLE) ×3 IMPLANT
STRIP CLOSURE SKIN 1/2X4 (GAUZE/BANDAGES/DRESSINGS) ×3 IMPLANT
SUT PROLENE 4 0 PS 2 18 (SUTURE) ×2 IMPLANT
SUT VIC AB 4-0 PS2 27 (SUTURE) ×3 IMPLANT
SYR CONTROL 10ML LL (SYRINGE) ×6 IMPLANT

## 2019-08-25 NOTE — Discharge Instructions (Signed)

## 2019-08-25 NOTE — Transfer of Care (Signed)
Immediate Anesthesia Transfer of Care Note  Patient: [facility]  Procedure(s) Performed: METATARSAL HEAD RESECTION OF THE 2ND AND 3RD METATARSAL LEFT FOOT (Left Toe)  Patient Location: PACU  Anesthesia Type:MAC  Level of Consciousness: awake  Airway & Oxygen Therapy: Patient Spontanous Breathing  Post-op Assessment: Report given to RN  Post vital signs: Reviewed and stable  Last Vitals:  Vitals Value Taken Time  BP    Temp    Pulse    Resp    SpO2      Last Pain:  Vitals:   08/25/19 0745  PainSc: 0-No pain         Complications: No apparent anesthesia complications

## 2019-08-25 NOTE — Op Note (Signed)
OPERATIVE NOTE  DATE OF PROCEDURE 08/25/2019  SURGEON Marcheta Grammes, DPM  ASSISTANT SURGEON Jilda Panda, DPM  OR STAFF Circulator: Cox, Gershon Mussel, RN Scrub Person: Karin Lieu, CST   PREOPERATIVE DIAGNOSIS 1.  Chronic ulceration of the left foot 2.  Plantarflexed metatarsals of the left foot  POSTOPERATIVE DIAGNOSIS Same  PROCEDURE 1.  Excision of the second metatarsal of the left foot 2.  Excision of the third metatarsal of the left foot  ANESTHESIA Monitor Anesthesia Care   HEMOSTASIS Pneumatic ankle tourniquet set at 250 mmHg  ESTIMATED BLOOD LOSS Minimal (<5 cc)  MATERIALS USED None  INJECTABLES 0.5% Marcaine plain  PATHOLOGY 1.  Second metatarsal of the left foot 2.  Third metatarsal of the left foot  COMPLICATIONS None  INDICATIONS: The patient has had a chronic ulceration of his left foot.  It is failed to heal with local wound care and accommodative inserts.  A Weil osteotomy of the second metatarsal was performed previously.  The ulcer healed for a while but reopened.  The ulceration has failed to heal with further local wound care and offloading.  DESCRIPTION OF THE PROCEDURE:  The patient was brought to the operating room and placed on the operative table in the supine position.  A pneumatic ankle tourniquet was applied to the operative extremity.  A timeout was performed.  Following sedation, the surgical site was anesthetized with 0.5% Marcaine plain.  The foot was then prepped, scrubbed, and draped in the usual sterile technique.  A second timeout was performed.  The foot was elevated, exsanguinated and the pneumatic ankle tourniquet inflated to 250 mmHg.    Attention was directed to the dorsal aspect of the second MPJ.  A linear longitudinal incision was made overlying the distal second metatarsal.  Dissection was continued deep down to the level of the second metatarsal.  All vital structures were identified and retracted.  A linear  periosteal and capsular incision was performed.  The periosteal and capsular structures were reflected medially and laterally exposing the second metatarsal.  A metallic screw from the previous Weil osteotomy was identified.  Using a power bone saw an osteotomy was performed proximal to the location of the hardware.  The second metatarsal head was freed of all soft tissue attachments and removed.  The second metatarsal head was inspected and found to be free of any plantar erosive changes consistent with osteomyelitis.  The second metatarsal was passed from the operative field and sent to pathology for evaluation.  The surgical wound was irrigated with copious amounts of sterile irrigant.  The subcutaneous structures were reapproximated using 4-0 Vicryl.  The skin was reapproximated using 4-0 Prolene.  Attention was directed to the dorsal aspect of the third MPJ.  A linear longitudinal incision was made overlying the distal third metatarsal.  Dissection was continued deep down to the level of the third metatarsal.  All vital structures were identified and retracted.  A linear periosteal and capsular incision was performed.  The periosteal and capsular structures were reflected medially and laterally exposing the third metatarsal.  Using a power bone saw an osteotomy was performed proximal to the location of the hardware.  The third metatarsal head was freed of all soft tissue attachments and removed.  The third metatarsal head was inspected and found to be free of any plantar erosive changes consistent with osteomyelitis.  The third metatarsal was passed from the operative field and sent to pathology for evaluation.  The surgical wound was  irrigated with copious amounts of sterile irrigant.  The subcutaneous structures were reapproximated using 4-0 Vicryl.  The skin was reapproximated using 4-0 Prolene.  Sterile compressive dressing was applied to the left foot.  The pneumatic ankle tourniquet was deflated and a  prompt hyperemic response was noted to all digits of the left foot.  The patient tolerated the procedure well.  The patient was then transferred to PACU with vital signs stable and vascular status intact to all toes of the left foot.

## 2019-08-25 NOTE — H&P (Signed)
HISTORY AND PHYSICAL INTERVAL NOTE:  08/25/2019  8:23 AM  [facility]  has presented today for surgery, with the diagnosis of an ulcer of the left foot.  The various methods of treatment have been discussed with the patient.  No guarantees were given.  After consideration of risks, benefits and other options for treatment, the patient has consented to surgery.  I have reviewed the patients' chart and labs.    Patient Vitals for the past 24 hrs:  BP Temp Pulse Resp SpO2  08/25/19 0745 (!) 152/72 97.8 F (36.6 C) 72 18 100 %    A history and physical examination was performed in my office.  The patient was reexamined.  There have been no changes to this history and physical examination.  Marcheta Grammes, DPM

## 2019-08-25 NOTE — Anesthesia Postprocedure Evaluation (Signed)
Anesthesia Post Note  Patient: [facility]  Procedure(s) Performed: METATARSAL HEAD RESECTION OF THE 2ND AND 3RD METATARSAL LEFT FOOT (Left Toe)  Patient location during evaluation: Phase II Anesthesia Type: MAC Level of consciousness: awake and alert and patient cooperative Pain management: satisfactory to patient Vital Signs Assessment: post-procedure vital signs reviewed and stable Respiratory status: spontaneous breathing Cardiovascular status: stable Postop Assessment: no apparent nausea or vomiting Anesthetic complications: no     Last Vitals:  Vitals:   08/25/19 1000 08/25/19 1011  BP: (P) 124/75 134/64  Pulse: 69 69  Resp: 17 17  Temp:  36.7 C  SpO2: 94% 93%    Last Pain:  Vitals:   08/25/19 1011  TempSrc: Oral  PainSc: 0-No pain                 Evanee Lubrano

## 2019-08-25 NOTE — Brief Op Note (Signed)
BRIEF OPERATIVE NOTE  DATE OF PROCEDURE 08/25/2019  SURGEON Marcheta Grammes, DPM  ASSISTANT SURGEON Jilda Panda, DPM  OR STAFF Circulator: Cox, Gershon Mussel, RN Scrub Person: Karin Lieu, CST   PREOPERATIVE DIAGNOSIS 1.  Chronic ulceration of the left foot 2.  Plantarflexed metatarsals of the left foot  POSTOPERATIVE DIAGNOSIS Same  PROCEDURE 1.  Excision of the second metatarsal of the left foot 2.  Excision of the third metatarsal of the left foot  ANESTHESIA Monitor Anesthesia Care   HEMOSTASIS Pneumatic ankle tourniquet set at 250 mmHg  ESTIMATED BLOOD LOSS Minimal (<5 cc)  MATERIALS USED None  INJECTABLES 0.5% Marcaine plain  PATHOLOGY 1.  Second metatarsal of the left foot 2.  Third metatarsal of the left foot  COMPLICATIONS None

## 2019-08-25 NOTE — Progress Notes (Signed)
After pt stood up to get pants pulled up, bandages to left foot became saturated with blood. Dr. Caprice Beaver was notified. Dr. Posey Pronto came over to post-op to re-dress the left foot. Bandages were clean, dry and intact when pt left.

## 2019-08-25 NOTE — Anesthesia Preprocedure Evaluation (Addendum)
Anesthesia Evaluation    Reviewed: reviewed documented beta blocker date and time   History of Anesthesia Complications (+) PONV  Airway Mallampati: II  TM Distance: >3 FB Neck ROM: Full    Dental no notable dental hx. (+) Teeth Intact   Pulmonary    Pulmonary exam normal breath sounds clear to auscultation       Cardiovascular Exercise Tolerance: Good hypertension, Pt. on medications and Pt. 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  No acute issues - no interventions taken   Reproductive/Obstetrics                            Anesthesia Physical Anesthesia Plan  ASA: II  Anesthesia Plan: MAC   Post-op Pain Management:    Induction: Intravenous  PONV Risk Score and Plan: 2 and TIVA, Propofol infusion and Treatment may vary due to age or medical condition  Airway Management Planned: Nasal Cannula and Simple Face Mask  Additional Equipment:   Intra-op Plan:   Post-operative Plan:   Informed Consent: I have reviewed the patients History and Physical, chart, labs and discussed the procedure including the risks, benefits and alternatives for the proposed anesthesia with the patient or authorized representative who has indicated his/her understanding and acceptance.     Dental advisory given  Plan Discussed with: CRNA  Anesthesia Plan Comments: (Plan Full PPE use  Plan MAC d/w pt -WTP with same after Q&A)        Anesthesia Quick Evaluation

## 2019-08-27 LAB — SURGICAL PATHOLOGY

## 2019-09-10 DIAGNOSIS — Z23 Encounter for immunization: Secondary | ICD-10-CM | POA: Diagnosis not present

## 2019-09-28 DIAGNOSIS — I10 Essential (primary) hypertension: Secondary | ICD-10-CM | POA: Diagnosis not present

## 2019-10-04 ENCOUNTER — Other Ambulatory Visit (INDEPENDENT_AMBULATORY_CARE_PROVIDER_SITE_OTHER): Payer: Self-pay | Admitting: *Deleted

## 2019-10-04 ENCOUNTER — Encounter (INDEPENDENT_AMBULATORY_CARE_PROVIDER_SITE_OTHER): Payer: Self-pay | Admitting: *Deleted

## 2019-10-13 ENCOUNTER — Telehealth (INDEPENDENT_AMBULATORY_CARE_PROVIDER_SITE_OTHER): Payer: Self-pay | Admitting: *Deleted

## 2019-10-13 ENCOUNTER — Other Ambulatory Visit: Payer: Self-pay

## 2019-10-13 ENCOUNTER — Ambulatory Visit (INDEPENDENT_AMBULATORY_CARE_PROVIDER_SITE_OTHER): Payer: Self-pay

## 2019-10-13 NOTE — Telephone Encounter (Signed)
Referring MD/PCP: vyas   Procedure: tcs  Reason/Indication:  Hx pokyps  Has patient had this procedure before?  Yes, 05/2012  If so, when, by whom and where?    Is there a family history of colon cancer?  no  Who?  What age when diagnosed?    Is patient diabetic?   no      Does patient have prosthetic heart valve or mechanical valve?  no  Do you have a pacemaker/defibrillator?  no  Has patient ever had endocarditis/atrial fibrillation? no  Does patient use oxygen? no  Has patient had joint replacement within last 12 months?  no  Is patient constipated or do they take laxatives? no  Does patient have a history of alcohol/drug use?  no  Is patient on blood thinner such as Coumadin, Plavix and/or Aspirin? yes  Medications: asa 81 mg daily, hctz 25 mg daily, fenofibrate 145 mg daily, benazepril 40 mg daily, rosuvastatin 5 mg daily, amlodipine 10 mg daily, metoprolol 25 mg daily, mvi daily, fish oil 1000 mg daily, vit c [id number] mg daily, folic acid [id number] mcg daily, zinc 50 mg daily, garlic [id number] mg daily, L-Lysine 500 mg daily, vit d3 50 mcg daily  Allergies: cipro  Medication Adjustment per Dr Laural Golden: asa 2 days  Procedure date & time: 11/03/19

## 2019-10-15 ENCOUNTER — Telehealth (INDEPENDENT_AMBULATORY_CARE_PROVIDER_SITE_OTHER): Payer: Self-pay | Admitting: *Deleted

## 2019-10-15 MED ORDER — PLENVU 140 G PO SOLR: 1.0000 | Freq: Once | ORAL | 0 refills | Status: AC

## 2019-10-15 NOTE — Telephone Encounter (Signed)
Patient needs plenvu(copay card)  

## 2019-10-27 DIAGNOSIS — Z299 Encounter for prophylactic measures, unspecified: Secondary | ICD-10-CM | POA: Diagnosis not present

## 2019-10-27 DIAGNOSIS — I10 Essential (primary) hypertension: Secondary | ICD-10-CM | POA: Diagnosis not present

## 2019-10-29 DIAGNOSIS — I10 Essential (primary) hypertension: Secondary | ICD-10-CM | POA: Diagnosis not present

## 2019-10-29 NOTE — Telephone Encounter (Signed)
Ok to schedule.

## 2019-11-01 ENCOUNTER — Other Ambulatory Visit: Payer: Self-pay

## 2019-11-01 ENCOUNTER — Other Ambulatory Visit (HOSPITAL_COMMUNITY)
Admission: RE | Admit: 2019-11-01 | Discharge: 2019-11-01 | Disposition: A | Discharge status: Home / Self Care | Payer: Medicare Other | Source: Ambulatory Visit | Attending: Internal Medicine | Admitting: Internal Medicine

## 2019-11-01 DIAGNOSIS — Z01812 Encounter for preprocedural laboratory examination: Secondary | ICD-10-CM | POA: Diagnosis not present

## 2019-11-01 DIAGNOSIS — Z20822 Contact with and (suspected) exposure to covid-19: Secondary | ICD-10-CM | POA: Diagnosis not present

## 2019-11-02 LAB — SARS CORONAVIRUS 2 (TAT 6-24 HRS): SARS Coronavirus 2: NEGATIVE

## 2019-11-03 ENCOUNTER — Encounter (HOSPITAL_COMMUNITY): Payer: Self-pay | Admitting: Internal Medicine

## 2019-11-03 ENCOUNTER — Encounter (HOSPITAL_COMMUNITY)
Admission: RE | Disposition: A | Discharge status: Home / Self Care | Payer: Self-pay | Source: Home / Self Care | Attending: Internal Medicine

## 2019-11-03 ENCOUNTER — Ambulatory Visit (HOSPITAL_COMMUNITY)
Admission: RE | Admit: 2019-11-03 | Discharge: 2019-11-03 | Disposition: A | Discharge status: Home / Self Care | Payer: Medicare Other | Attending: Internal Medicine | Admitting: Internal Medicine

## 2019-11-03 ENCOUNTER — Other Ambulatory Visit: Payer: Self-pay

## 2019-11-03 DIAGNOSIS — Z79899 Other long term (current) drug therapy: Secondary | ICD-10-CM | POA: Diagnosis not present

## 2019-11-03 DIAGNOSIS — Z86010 Personal history of colonic polyps: Secondary | ICD-10-CM | POA: Diagnosis not present

## 2019-11-03 DIAGNOSIS — K5730 Diverticulosis of large intestine without perforation or abscess without bleeding: Secondary | ICD-10-CM | POA: Diagnosis not present

## 2019-11-03 DIAGNOSIS — K648 Other hemorrhoids: Secondary | ICD-10-CM | POA: Insufficient documentation

## 2019-11-03 DIAGNOSIS — Z881 Allergy status to other antibiotic agents status: Secondary | ICD-10-CM | POA: Insufficient documentation

## 2019-11-03 DIAGNOSIS — Z1211 Encounter for screening for malignant neoplasm of colon: Secondary | ICD-10-CM | POA: Diagnosis not present

## 2019-11-03 DIAGNOSIS — Z7982 Long term (current) use of aspirin: Secondary | ICD-10-CM | POA: Diagnosis not present

## 2019-11-03 DIAGNOSIS — Z09 Encounter for follow-up examination after completed treatment for conditions other than malignant neoplasm: Secondary | ICD-10-CM | POA: Diagnosis not present

## 2019-11-03 DIAGNOSIS — E7800 Pure hypercholesterolemia, unspecified: Secondary | ICD-10-CM | POA: Insufficient documentation

## 2019-11-03 DIAGNOSIS — I10 Essential (primary) hypertension: Secondary | ICD-10-CM | POA: Diagnosis not present

## 2019-11-03 DIAGNOSIS — K635 Polyp of colon: Secondary | ICD-10-CM | POA: Diagnosis not present

## 2019-11-03 DIAGNOSIS — K6289 Other specified diseases of anus and rectum: Secondary | ICD-10-CM | POA: Insufficient documentation

## 2019-11-03 DIAGNOSIS — Z87891 Personal history of nicotine dependence: Secondary | ICD-10-CM | POA: Diagnosis not present

## 2019-11-03 DIAGNOSIS — D123 Benign neoplasm of transverse colon: Secondary | ICD-10-CM | POA: Diagnosis not present

## 2019-11-03 HISTORY — PX: COLONOSCOPY: SHX5424

## 2019-11-03 HISTORY — PX: POLYPECTOMY: SHX5525

## 2019-11-03 SURGERY — COLONOSCOPY
Anesthesia: Moderate Sedation

## 2019-11-03 MED ORDER — MIDAZOLAM HCL 5 MG/5ML IJ SOLN
INTRAMUSCULAR | Status: DC | PRN
  Administered 2019-11-03: 1 mg via INTRAVENOUS
  Administered 2019-11-03: 2 mg via INTRAVENOUS
  Administered 2019-11-03: 1 mg via INTRAVENOUS

## 2019-11-03 MED ORDER — SODIUM CHLORIDE 0.9 % IV SOLN
INTRAVENOUS | Status: DC
  Administered 2019-11-03: 1000 mL via INTRAVENOUS

## 2019-11-03 MED ORDER — MIDAZOLAM HCL 5 MG/5ML IJ SOLN
INTRAMUSCULAR | Status: AC
  Filled 2019-11-03: qty 10

## 2019-11-03 MED ORDER — MEPERIDINE HCL 50 MG/ML IJ SOLN
INTRAMUSCULAR | Status: DC | PRN
  Administered 2019-11-03: 20 mg via INTRAVENOUS
  Administered 2019-11-03: 10 mg via INTRAVENOUS

## 2019-11-03 MED ORDER — MEPERIDINE HCL 50 MG/ML IJ SOLN
INTRAMUSCULAR | Status: AC
  Filled 2019-11-03: qty 1

## 2019-11-03 MED ORDER — STERILE WATER FOR IRRIGATION IR SOLN
Status: DC | PRN
  Administered 2019-11-03: 1.5 mL

## 2019-11-03 NOTE — Op Note (Signed)
[facility] Patient Name: Carl Young Procedure Date: 11/03/2019 10:06 AM MRN: [id number] Date of Birth: [birth year] Attending MD: Hildred Laser , MD CSN: [id number] Age: 79 Admit Type: Outpatient Procedure:                Colonoscopy Indications:              High risk colon cancer surveillance: Personal                            history of colonic polyps Providers:                Hildred Laser, MD, Otis Peak B. Sharon Seller, RN, Nelma Rothman, Technician Referring MD:             Glenda Chroman, MD Medicines:                Meperidine 40 mg IV, Midazolam 3 mg IV Complications:            No immediate complications. Estimated Blood Loss:     Estimated blood loss was minimal. Procedure:                Pre-Anesthesia Assessment:                           - Prior to the procedure, a History and Physical                            was performed, and patient medications and                            allergies were reviewed. The patient's tolerance of                            previous anesthesia was also reviewed. The risks                            and benefits of the procedure and the sedation                            options and risks were discussed with the patient.                            All questions were answered, and informed consent                            was obtained. Prior Anticoagulants: The patient has                            taken no previous anticoagulant or antiplatelet                            agents except for aspirin. ASA Grade Assessment: II                            -  A patient with mild systemic disease. After                            reviewing the risks and benefits, the patient was                            deemed in satisfactory condition to undergo the                            procedure.                           After obtaining informed consent, the colonoscope                            was passed under direct  vision. Throughout the                            procedure, the patient's blood pressure, pulse, and                            oxygen saturations were monitored continuously. The                            [id number]) was introduced through the                            anus and advanced to the the cecum, identified by                            appendiceal orifice and ileocecal valve. The                            colonoscopy was performed without difficulty. The                            patient tolerated the procedure well. The quality                            of the bowel preparation was good. The ileocecal                            valve, appendiceal orifice, and rectum were                            photographed. Scope In: 10:17:36 AM Scope Out: 10:34:07 AM Scope Withdrawal Time: 0 hours 11 minutes 44 seconds  Total Procedure Duration: 0 hours 16 minutes 31 seconds  Findings:      The perianal and digital rectal examinations were normal.      Two sessile polyps were found in the distal transverse colon. The polyps       were small in size. These were biopsied with a cold forceps for       histology. The pathology specimen was placed into Bottle Number 1.  A few diverticula were found in the sigmoid colon.      Internal hemorrhoids were found during retroflexion. The hemorrhoids       were small.      Anal papilla(e) were hypertrophied. Impression:               - Two small polyps in the distal transverse colon.                            Biopsied.                           - Diverticulosis in the sigmoid colon.                           - Internal hemorrhoids.                           - Anal papilla(e) were hypertrophied. Moderate Sedation:      Moderate (conscious) sedation was administered by the endoscopy nurse       and supervised by the endoscopist. The following parameters were       monitored: oxygen saturation, heart rate, blood pressure, CO2        capnography and response to care. Total physician intraservice time was       22 minutes. Recommendation:           - Patient has a contact number available for                            emergencies. The signs and symptoms of potential                            delayed complications were discussed with the                            patient. Return to normal activities tomorrow.                            Written discharge instructions were provided to the                            patient.                           - High fiber diet today.                           - Continue present medications.                           - No aspirin, ibuprofen, naproxen, or other                            non-steroidal anti-inflammatory drugs for 1 day.                           - Await pathology results.                           -  No recommendation at this time regarding repeat                            colonoscopy. Procedure Code(s):        --- Professional ---                           [phone], Colonoscopy, flexible; with biopsy, single                            or multiple                           G0500, Moderate sedation services provided by the                            same physician or other qualified health care                            professional performing a gastrointestinal                            endoscopic service that sedation supports,                            requiring the presence of an independent trained                            observer to assist in the monitoring of the                            patient's level of consciousness and physiological                            status; initial 15 minutes of intra-service time;                            patient age 67 years or older (additional time may                            be reported with [phone], as appropriate) Diagnosis Code(s):        --- Professional ---                           Z86.010, Personal history  of colonic polyps                           K63.5, Polyp of colon                           K64.8, Other hemorrhoids                           K62.89, Other specified diseases of anus and rectum  K57.30, Diverticulosis of large intestine without                            perforation or abscess without bleeding CPT copyright 2019 [employer]. All rights reserved. The codes documented in this report are preliminary and upon coder review may  be revised to meet current compliance requirements. Hildred Laser, MD Hildred Laser, MD 11/03/2019 10:43:39 AM This report has been signed electronically. Number of Addenda: 0

## 2019-11-03 NOTE — H&P (Signed)
Carl Young is an 79 y.o. male.   Chief Complaint: Patient is here for colonoscopy. HPI: Patient is 79 year old Caucasian male with history of colonic adenomas and is here for surveillance colonoscopy.  He had colonic adenomas on his first colonoscopy but not on his second colonoscopy November 2013.  Polyps are hyperplastic.  He was therefore advised to wait 7 years before his next exam.  He denies abdominal pain change in bowel habits or rectal bleeding.  Family history is negative for CRC.  Past Medical History:  Diagnosis Date  . History of kidney stones   . Hypercholesteremia   . Hypertension   . PONV (postoperative nausea and vomiting)     Past Surgical History:  Procedure Laterality Date  . APPENDECTOMY    . COLONOSCOPY  05/06/2012   Procedure: COLONOSCOPY;  Surgeon: Rogene Houston, MD;  Location: [facility];  Service: Endoscopy;  Laterality: N/A;  730  . HAMMER TOE SURGERY Left 07/09/2017   Procedure: HAMMER TOE CORRECTION 2ND DIGIT LEFT FOOT;  Surgeon: Caprice Beaver, DPM;  Location: [facility];  Service: Podiatry;  Laterality: Left;  . METATARSAL HEAD EXCISION Left 08/25/2019   Procedure: METATARSAL HEAD RESECTION OF THE 2ND AND 3RD METATARSAL LEFT FOOT;  Surgeon: Caprice Beaver, DPM;  Location: [facility];  Service: Podiatry;  Laterality: Left;  . WEIL OSTEOTOMY Left 07/09/2017   Procedure: WEIL OSTEOTOMY 2ND METATARSAL LEFT FOOT;  Surgeon: Caprice Beaver, DPM;  Location: [facility];  Service: Podiatry;  Laterality: Left;    Family History  Problem Relation Age of Onset  . Kidney disease Maternal Grandmother    Social History:  reports that he quit smoking about 34 years ago. His smoking use included cigarettes and cigars. He has never used smokeless tobacco. He reports current alcohol use. He reports that he does not use drugs.  Allergies:  Allergies  Allergen Reactions  . Ciprofloxacin In D5w Rash and Other (See Comments)    Pruritis     Medications Prior to  Admission  Medication Sig Dispense Refill  . amLODipine (NORVASC) 10 MG tablet Take 10 mg by mouth daily.    Marland Kitchen ascorbic acid (VITAMIN C) 500 MG tablet Take 500 mg by mouth daily.    Marland Kitchen aspirin EC 81 MG tablet Take 81 mg by mouth every morning.     . benazepril (LOTENSIN) 40 MG tablet Take 40 mg by mouth every morning.   4  . chlorthalidone (HYGROTON) 25 MG tablet Take 25 mg by mouth daily.    . Cholecalciferol (VITAMIN D3) 50 MCG (2000 UT) capsule Take 2,000 Units by mouth every evening.     . fenofibrate (TRICOR) 145 MG tablet Take 145 mg by mouth every evening.     . fish oil-omega-3 fatty acids 1000 MG capsule Take 1 g by mouth every morning.     . folic acid (FOLVITE) A999333 MCG tablet Take 400 mcg by mouth every morning.     . Garlic 123XX123 MG CAPS Take 1,000 mg by mouth every morning.     Marland Kitchen Lysine 500 MG CAPS Take 500 mg by mouth every morning.     . metoprolol succinate (TOPROL-XL) 25 MG 24 hr tablet Take 25 mg by mouth in the morning and at bedtime.     . Multiple Vitamin (MULTIVITAMIN WITH MINERALS) TABS Take 1 tablet by mouth daily.    . Multiple Vitamins-Minerals (PRESERVISION AREDS 2+MULTI VIT PO) Take 1 capsule by mouth 2 (two) times daily.    Marland Kitchen  rosuvastatin (CRESTOR) 5 MG tablet Take 5 mg by mouth daily.    Marland Kitchen zinc gluconate 50 MG tablet Take 50 mg by mouth every morning.     . fexofenadine-pseudoephedrine (ALLEGRA-D 24) 180-240 MG 24 hr tablet Take 1 tablet by mouth daily as needed (allergies).      No results found for this or any previous visit (from the past 48 hour(s)). No results found.  Review of Systems  Blood pressure (!) 149/72, pulse 70, temperature 98.5 F (36.9 C), temperature source Oral, resp. rate 19, height 5\' 11"  (1.803 m), weight 93.4 kg, SpO2 96 %. Physical Exam  Constitutional: He appears well-developed and well-nourished.  HENT:  Mouth/Throat: Oropharynx is clear and moist.  Eyes: Conjunctivae are normal. No scleral icterus.  Neck: No thyromegaly present.   Cardiovascular: Normal rate, regular rhythm and normal heart sounds.  No murmur heard. Respiratory: Effort normal and breath sounds normal.  GI:  Scar at umbilicus with hernia which is completely reducible.  Abdomen is soft and nontender with organomegaly or masses.  Musculoskeletal:        General: No edema.  Lymphadenopathy:    He has no cervical adenopathy.  Neurological: He is alert.  Skin: Skin is warm and dry.     Assessment/Plan History of colonic adenomas. Surveillance colonoscopy.  Hildred Laser, MD 11/03/2019, 10:07 AM

## 2019-11-03 NOTE — Discharge Instructions (Signed)
Resume aspirin on 11/04/2019 Resume other medications as before. High-fiber diet. No driving for 24 hours. Physician will call with biopsy results.   Colonoscopy, Adult, Care After This sheet gives you information about how to care for yourself after your procedure. Your doctor may also give you more specific instructions. If you have problems or questions, call your doctor. What can I expect after the procedure? After the procedure, it is common to have:  A small amount of blood in your poop (stool) for 24 hours.  Some gas.  Mild cramping or bloating in your belly (abdomen). Follow these instructions at home: Eating and drinking   Drink enough fluid to keep your pee (urine) pale yellow.  Follow instructions from your doctor about what you cannot eat or drink.  Return to your normal diet as told by your doctor. Avoid heavy or fried foods that are hard to digest. Activity  Rest as told by your doctor.  Do not sit for a long time without moving. Get up to take short walks every 1-2 hours. This is important. Ask for help if you feel weak or unsteady.  Return to your normal activities as told by your doctor. Ask your doctor what activities are safe for you. To help cramping and bloating:   Try walking around.  Put heat on your belly as told by your doctor. Use the heat source that your doctor recommends, such as a moist heat pack or a heating pad. ? Put a towel between your skin and the heat source. ? Leave the heat on for 20-30 minutes. ? Remove the heat if your skin turns bright red. This is very important if you are unable to feel pain, heat, or cold. You may have a greater risk of getting burned. General instructions  For the first 24 hours after the procedure: ? Do not drive or use machinery. ? Do not sign important documents. ? Do not drink alcohol. ? Do your daily activities more slowly than normal. ? Eat foods that are soft and easy to digest.  Take  over-the-counter or prescription medicines only as told by your doctor.  Keep all follow-up visits as told by your doctor. This is important. Contact a doctor if:  You have blood in your poop 2-3 days after the procedure. Get help right away if:  You have more than a small amount of blood in your poop.  You see large clumps of tissue (blood clots) in your poop.  Your belly is swollen.  You feel like you may vomit (nauseous).  You vomit.  You have a fever.  You have belly pain that gets worse, and medicine does not help your pain. Summary  After the procedure, it is common to have a small amount of blood in your poop. You may also have mild cramping and bloating in your belly.  For the first 24 hours after the procedure, do not drive or use machinery, do not sign important documents, and do not drink alcohol.  Get help right away if you have a lot of blood in your poop, feel like you may vomit, have a fever, or have more belly pain. This information is not intended to replace advice given to you by your health care provider. Make sure you discuss any questions you have with your health care provider. Document Revised: 01/11/2019 Document Reviewed: 01/11/2019 Elsevier Patient Education  [place].    PATIENT INSTRUCTIONS POST-ANESTHESIA  IMMEDIATELY FOLLOWING SURGERY:  Do not drive or operate  machinery for the first twenty four hours after surgery.  Do not make any important decisions for twenty four hours after surgery or while taking narcotic pain medications or sedatives.  If you develop intractable nausea and vomiting or a severe headache please notify your doctor immediately.  FOLLOW-UP:  Please make an appointment with your surgeon as instructed. You do not need to follow up with anesthesia unless specifically instructed to do so.  WOUND CARE INSTRUCTIONS (if applicable):  Keep a dry clean dressing on the anesthesia/puncture wound site if there is drainage.  Once  the wound has quit draining you may leave it open to air.  Generally you should leave the bandage intact for twenty four hours unless there is drainage.  If the epidural site drains for more than 36-48 hours please call the anesthesia department.  QUESTIONS?:  Please feel free to call your physician or the hospital operator if you have any questions, and they will be happy to assist you.      Colon Polyps  Polyps are tissue growths inside the body. Polyps can grow in many places, including the large intestine (colon). A polyp may be a round bump or a mushroom-shaped growth. You could have one polyp or several. Most colon polyps are noncancerous (benign). However, some colon polyps can become cancerous over time. Finding and removing the polyps early can help prevent this. What are the causes? The exact cause of colon polyps is not known. What increases the risk? You are more likely to develop this condition if you:  Have a family history of colon cancer or colon polyps.  Are older than 10 or older than 45 if you are African American.  Have inflammatory bowel disease, such as ulcerative colitis or Crohn's disease.  Have certain hereditary conditions, such as: ? Familial adenomatous polyposis. ? Lynch syndrome. ? Turcot syndrome. ? Peutz-Jeghers syndrome.  Are overweight.  Smoke cigarettes.  Do not get enough exercise.  Drink too much alcohol.  Eat a diet that is high in fat and red meat and low in fiber.  Had childhood cancer that was treated with abdominal radiation. What are the signs or symptoms? Most polyps do not cause symptoms. If you have symptoms, they may include:  Blood coming from your rectum when having a bowel movement.  Blood in your stool. The stool may look dark red or black.  Abdominal pain.  A change in bowel habits, such as constipation or diarrhea. How is this diagnosed? This condition is diagnosed with a colonoscopy. This is a procedure in which a  lighted, flexible scope is inserted into the anus and then passed into the colon to examine the area. Polyps are sometimes found when a colonoscopy is done as part of routine cancer screening tests. How is this treated? Treatment for this condition involves removing any polyps that are found. Most polyps can be removed during a colonoscopy. Those polyps will then be tested for cancer. Additional treatment may be needed depending on the results of testing. Follow these instructions at home: Lifestyle  Maintain a healthy weight, or lose weight if recommended by your health care provider.  Exercise every day or as told by your health care provider.  Do not use any products that contain nicotine or tobacco, such as cigarettes and e-cigarettes. If you need help quitting, ask your health care provider.  If you drink alcohol, limit how much you have: ? 0-1 drink a day for women. ? 0-2 drinks a day for men.  Be aware of how much alcohol is in your drink. In the [place] one drink equals one 12 oz bottle of beer (355 mL), one 5 oz glass of wine (148 mL), or one 1 oz shot of hard liquor (44 mL). Eating and drinking   Eat foods that are high in fiber, such as fruits, vegetables, and whole grains.  Eat foods that are high in calcium and vitamin D, such as milk, cheese, yogurt, eggs, liver, fish, and broccoli.  Limit foods that are high in fat, such as fried foods and desserts.  Limit the amount of red meat and processed meat you eat, such as hot dogs, sausage, bacon, and lunch meats. General instructions  Keep all follow-up visits as told by your health care provider. This is important. ? This includes having regularly scheduled colonoscopies. ? Talk to your health care provider about when you need a colonoscopy. Contact a health care provider if:  You have new or worsening bleeding during a bowel movement.  You have new or increased blood in your stool.  You have a change in bowel  habits.  You lose weight for no known reason. Summary  Polyps are tissue growths inside the body. Polyps can grow in many places, including the colon.  Most colon polyps are noncancerous (benign), but some can become cancerous over time.  This condition is diagnosed with a colonoscopy.  Treatment for this condition involves removing any polyps that are found. Most polyps can be removed during a colonoscopy. This information is not intended to replace advice given to you by your health care provider. Make sure you discuss any questions you have with your health care provider. Document Revised: 10/02/2017 Document Reviewed: 10/02/2017 Elsevier Patient Education  [place].   Colon Polyps  Polyps are tissue growths inside the body. Polyps can grow in many places, including the large intestine (colon). A polyp may be a round bump or a mushroom-shaped growth. You could have one polyp or several. Most colon polyps are noncancerous (benign). However, some colon polyps can become cancerous over time. Finding and removing the polyps early can help prevent this. What are the causes? The exact cause of colon polyps is not known. What increases the risk? You are more likely to develop this condition if you:  Have a family history of colon cancer or colon polyps.  Are older than 83 or older than 45 if you are African American.  Have inflammatory bowel disease, such as ulcerative colitis or Crohn's disease.  Have certain hereditary conditions, such as: ? Familial adenomatous polyposis. ? Lynch syndrome. ? Turcot syndrome. ? Peutz-Jeghers syndrome.  Are overweight.  Smoke cigarettes.  Do not get enough exercise.  Drink too much alcohol.  Eat a diet that is high in fat and red meat and low in fiber.  Had childhood cancer that was treated with abdominal radiation. What are the signs or symptoms? Most polyps do not cause symptoms. If you have symptoms, they may  include:  Blood coming from your rectum when having a bowel movement.  Blood in your stool. The stool may look dark red or black.  Abdominal pain.  A change in bowel habits, such as constipation or diarrhea. How is this diagnosed? This condition is diagnosed with a colonoscopy. This is a procedure in which a lighted, flexible scope is inserted into the anus and then passed into the colon to examine the area. Polyps are sometimes found when a colonoscopy is done as part of routine  cancer screening tests. How is this treated? Treatment for this condition involves removing any polyps that are found. Most polyps can be removed during a colonoscopy. Those polyps will then be tested for cancer. Additional treatment may be needed depending on the results of testing. Follow these instructions at home: Lifestyle  Maintain a healthy weight, or lose weight if recommended by your health care provider.  Exercise every day or as told by your health care provider.  Do not use any products that contain nicotine or tobacco, such as cigarettes and e-cigarettes. If you need help quitting, ask your health care provider.  If you drink alcohol, limit how much you have: ? 0-1 drink a day for women. ? 0-2 drinks a day for men.  Be aware of how much alcohol is in your drink. In the [place] one drink equals one 12 oz bottle of beer (355 mL), one 5 oz glass of wine (148 mL), or one 1 oz shot of hard liquor (44 mL). Eating and drinking   Eat foods that are high in fiber, such as fruits, vegetables, and whole grains.  Eat foods that are high in calcium and vitamin D, such as milk, cheese, yogurt, eggs, liver, fish, and broccoli.  Limit foods that are high in fat, such as fried foods and desserts.  Limit the amount of red meat and processed meat you eat, such as hot dogs, sausage, bacon, and lunch meats. General instructions  Keep all follow-up visits as told by your health care provider. This is  important. ? This includes having regularly scheduled colonoscopies. ? Talk to your health care provider about when you need a colonoscopy. Contact a health care provider if:  You have new or worsening bleeding during a bowel movement.  You have new or increased blood in your stool.  You have a change in bowel habits.  You lose weight for no known reason. Summary  Polyps are tissue growths inside the body. Polyps can grow in many places, including the colon.  Most colon polyps are noncancerous (benign), but some can become cancerous over time.  This condition is diagnosed with a colonoscopy.  Treatment for this condition involves removing any polyps that are found. Most polyps can be removed during a colonoscopy. This information is not intended to replace advice given to you by your health care provider. Make sure you discuss any questions you have with your health care provider. Document Revised: 10/02/2017 Document Reviewed: 10/02/2017 Elsevier Patient Education  [place].   Hemorrhoids Hemorrhoids are swollen veins that may develop:  In the butt (rectum). These are called internal hemorrhoids.  Around the opening of the butt (anus). These are called external hemorrhoids. Hemorrhoids can cause pain, itching, or bleeding. Most of the time, they do not cause serious problems. They usually get better with diet changes, lifestyle changes, and other home treatments. What are the causes? This condition may be caused by:  Having trouble pooping (constipation).  Pushing hard (straining) to poop.  Watery poop (diarrhea).  Pregnancy.  Being very overweight (obese).  Sitting for long periods of time.  Heavy lifting or other activity that causes you to strain.  Anal sex.  Riding a bike for a long period of time. What are the signs or symptoms? Symptoms of this condition include:  Pain.  Itching or soreness in the butt.  Bleeding from the butt.  Leaking  poop.  Swelling in the area.  One or more lumps around the opening of your butt.  How is this diagnosed? A doctor can often diagnose this condition by looking at the affected area. The doctor may also:  Do an exam that involves feeling the area with a gloved hand (digital rectal exam).  Examine the area inside your butt using a small tube (anoscope).  Order blood tests. This may be done if you have lost a lot of blood.  Have you get a test that involves looking inside the colon using a flexible tube with a camera on the end (sigmoidoscopy or colonoscopy). How is this treated? This condition can usually be treated at home. Your doctor may tell you to change what you eat, make lifestyle changes, or try home treatments. If these do not help, procedures can be done to remove the hemorrhoids or make them smaller. These may involve:  Placing rubber bands at the base of the hemorrhoids to cut off their blood supply.  Injecting medicine into the hemorrhoids to shrink them.  Shining a type of light energy onto the hemorrhoids to cause them to fall off.  Doing surgery to remove the hemorrhoids or cut off their blood supply. Follow these instructions at home: Eating and drinking   Eat foods that have a lot of fiber in them. These include whole grains, beans, nuts, fruits, and vegetables.  Ask your doctor about taking products that have added fiber (fibersupplements).  Reduce the amount of fat in your diet. You can do this by: ? Eating low-fat dairy products. ? Eating less red meat. ? Avoiding processed foods.  Drink enough fluid to keep your pee (urine) pale yellow. Managing pain and swelling   Take a warm-water bath (sitz bath) for 20 minutes to ease pain. Do this 3-4 times a day. You may do this in a bathtub or using a portable sitz bath that fits over the toilet.  If told, put ice on the painful area. It may be helpful to use ice between your warm baths. ? Put ice in a plastic  bag. ? Place a towel between your skin and the bag. ? Leave the ice on for 20 minutes, 2-3 times a day. General instructions  Take over-the-counter and prescription medicines only as told by your doctor. ? Medicated creams and medicines may be used as told.  Exercise often. Ask your doctor how much and what kind of exercise is best for you.  Go to the bathroom when you have the urge to poop. Do not wait.  Avoid pushing too hard when you poop.  Keep your butt dry and clean. Use wet toilet paper or moist towelettes after pooping.  Do not sit on the toilet for a long time.  Keep all follow-up visits as told by your doctor. This is important. Contact a doctor if you:  Have pain and swelling that do not get better with treatment or medicine.  Have trouble pooping.  Cannot poop.  Have pain or swelling outside the area of the hemorrhoids. Get help right away if you have:  Bleeding that will not stop. Summary  Hemorrhoids are swollen veins in the butt or around the opening of the butt.  They can cause pain, itching, or bleeding.  Eat foods that have a lot of fiber in them. These include whole grains, beans, nuts, fruits, and vegetables.  Take a warm-water bath (sitz bath) for 20 minutes to ease pain. Do this 3-4 times a day. This information is not intended to replace advice given to you by your health care provider. Make sure  you discuss any questions you have with your health care provider. Document Revised: 06/25/2018 Document Reviewed: 11/06/2017 Elsevier Patient Education  [place].    High-Fiber Diet Fiber, also called dietary fiber, is a type of carbohydrate that is found in fruits, vegetables, whole grains, and beans. A high-fiber diet can have many health benefits. Your health care provider may recommend a high-fiber diet to help:  Prevent constipation. Fiber can make your bowel movements more regular.  Lower your cholesterol.  Relieve the following  conditions: ? Swelling of veins in the anus (hemorrhoids). ? Swelling and irritation (inflammation) of specific areas of the digestive tract (uncomplicated diverticulosis). ? A problem of the large intestine (colon) that sometimes causes pain and diarrhea (irritable bowel syndrome, IBS).  Prevent overeating as part of a weight-loss plan.  Prevent heart disease, type 2 diabetes, and certain cancers. What is my plan? The recommended daily fiber intake in grams (g) includes:  38 g for men age 65 or younger.  30 g for men over age 76.  92 g for women age 79 or younger.  21 g for women over age 43. You can get the recommended daily intake of dietary fiber by:  Eating a variety of fruits, vegetables, grains, and beans.  Taking a fiber supplement, if it is not possible to get enough fiber through your diet. What do I need to know about a high-fiber diet?  It is better to get fiber through food sources rather than from fiber supplements. There is not a lot of research about how effective supplements are.  Always check the fiber content on the nutrition facts label of any prepackaged food. Look for foods that contain 5 g of fiber or more per serving.  Talk with a diet and nutrition specialist (dietitian) if you have questions about specific foods that are recommended or not recommended for your medical condition, especially if those foods are not listed below.  Gradually increase how much fiber you consume. If you increase your intake of dietary fiber too quickly, you may have bloating, cramping, or gas.  Drink plenty of water. Water helps you to digest fiber. What are tips for following this plan?  Eat a wide variety of high-fiber foods.  Make sure that half of the grains that you eat each day are whole grains.  Eat breads and cereals that are made with whole-grain flour instead of refined flour or white flour.  Eat brown rice, bulgur wheat, or millet instead of white rice.  Start  the day with a breakfast that is high in fiber, such as a cereal that contains 5 g of fiber or more per serving.  Use beans in place of meat in soups, salads, and pasta dishes.  Eat high-fiber snacks, such as berries, raw vegetables, nuts, and popcorn.  Choose whole fruits and vegetables instead of processed forms like juice or sauce. What foods can I eat?  Fruits Berries. Pears. Apples. Oranges. Avocado. Prunes and raisins. Dried figs. Vegetables Sweet potatoes. Spinach. Kale. Artichokes. Cabbage. Broccoli. Cauliflower. Green peas. Carrots. Squash. Grains Whole-grain breads. Multigrain cereal. Oats and oatmeal. Brown rice. Barley. Bulgur wheat. [place]. Quinoa. Bran muffins. Popcorn. Rye wafer crackers. Meats and other proteins Navy, kidney, and pinto beans. Soybeans. Split peas. Lentils. Nuts and seeds. Dairy Fiber-fortified yogurt. Beverages Fiber-fortified soy milk. Fiber-fortified orange juice. Other foods Fiber bars. The items listed above may not be a complete list of recommended foods and beverages. Contact a dietitian for more options. What foods are  not recommended? Fruits Fruit juice. Cooked, strained fruit. Vegetables Fried potatoes. Canned vegetables. Well-cooked vegetables. Grains White bread. Pasta made with refined flour. White rice. Meats and other proteins Fatty cuts of meat. Fried chicken or fried fish. Dairy Milk. Yogurt. Cream cheese. Sour cream. Fats and oils Butters. Beverages Soft drinks. Other foods Cakes and pastries. The items listed above may not be a complete list of foods and beverages to avoid. Contact a dietitian for more information. Summary  Fiber is a type of carbohydrate. It is found in fruits, vegetables, whole grains, and beans.  There are many health benefits of eating a high-fiber diet, such as preventing constipation, lowering blood cholesterol, helping with weight loss, and reducing your risk of heart disease, diabetes, and  certain cancers.  Gradually increase your intake of fiber. Increasing too fast can result in cramping, bloating, and gas. Drink plenty of water while you increase your fiber.  The best sources of fiber include whole fruits and vegetables, whole grains, nuts, seeds, and beans. This information is not intended to replace advice given to you by your health care provider. Make sure you discuss any questions you have with your health care provider. Document Revised: 04/21/2017 Document Reviewed: 04/21/2017 Elsevier Patient Education  2020 [employer].

## 2019-11-04 LAB — SURGICAL PATHOLOGY

## 2019-11-30 DIAGNOSIS — M7741 Metatarsalgia, right foot: Secondary | ICD-10-CM | POA: Diagnosis not present

## 2019-12-16 DIAGNOSIS — D225 Melanocytic nevi of trunk: Secondary | ICD-10-CM | POA: Diagnosis not present

## 2019-12-16 DIAGNOSIS — L570 Actinic keratosis: Secondary | ICD-10-CM | POA: Diagnosis not present

## 2019-12-16 DIAGNOSIS — X32XXXD Exposure to sunlight, subsequent encounter: Secondary | ICD-10-CM | POA: Diagnosis not present

## 2020-01-14 DIAGNOSIS — H903 Sensorineural hearing loss, bilateral: Secondary | ICD-10-CM | POA: Diagnosis not present

## 2020-01-14 DIAGNOSIS — H838X3 Other specified diseases of inner ear, bilateral: Secondary | ICD-10-CM | POA: Diagnosis not present

## 2020-03-10 DIAGNOSIS — M7742 Metatarsalgia, left foot: Secondary | ICD-10-CM | POA: Diagnosis not present

## 2020-03-10 DIAGNOSIS — M7741 Metatarsalgia, right foot: Secondary | ICD-10-CM | POA: Diagnosis not present

## 2020-03-13 DIAGNOSIS — R202 Paresthesia of skin: Secondary | ICD-10-CM | POA: Diagnosis not present

## 2020-03-13 DIAGNOSIS — E7800 Pure hypercholesterolemia, unspecified: Secondary | ICD-10-CM | POA: Diagnosis not present

## 2020-03-13 DIAGNOSIS — I10 Essential (primary) hypertension: Secondary | ICD-10-CM | POA: Diagnosis not present

## 2020-03-13 DIAGNOSIS — Z87891 Personal history of nicotine dependence: Secondary | ICD-10-CM | POA: Diagnosis not present

## 2020-03-13 DIAGNOSIS — Z299 Encounter for prophylactic measures, unspecified: Secondary | ICD-10-CM | POA: Diagnosis not present

## 2020-03-13 DIAGNOSIS — E0590 Thyrotoxicosis, unspecified without thyrotoxic crisis or storm: Secondary | ICD-10-CM | POA: Diagnosis not present

## 2020-03-13 DIAGNOSIS — Z6827 Body mass index (BMI) 27.0-27.9, adult: Secondary | ICD-10-CM | POA: Diagnosis not present

## 2020-03-22 ENCOUNTER — Encounter: Payer: Self-pay | Admitting: Family Medicine

## 2020-03-22 DIAGNOSIS — Z299 Encounter for prophylactic measures, unspecified: Secondary | ICD-10-CM | POA: Diagnosis not present

## 2020-03-22 DIAGNOSIS — Z1331 Encounter for screening for depression: Secondary | ICD-10-CM | POA: Diagnosis not present

## 2020-03-22 DIAGNOSIS — E7800 Pure hypercholesterolemia, unspecified: Secondary | ICD-10-CM | POA: Diagnosis not present

## 2020-03-22 DIAGNOSIS — I10 Essential (primary) hypertension: Secondary | ICD-10-CM | POA: Diagnosis not present

## 2020-03-22 DIAGNOSIS — R5383 Other fatigue: Secondary | ICD-10-CM | POA: Diagnosis not present

## 2020-03-22 DIAGNOSIS — Z0000 Encounter for general adult medical examination without abnormal findings: Secondary | ICD-10-CM | POA: Diagnosis not present

## 2020-03-22 DIAGNOSIS — Z125 Encounter for screening for malignant neoplasm of prostate: Secondary | ICD-10-CM | POA: Diagnosis not present

## 2020-03-22 DIAGNOSIS — Z7189 Other specified counseling: Secondary | ICD-10-CM | POA: Diagnosis not present

## 2020-03-22 DIAGNOSIS — Z1339 Encounter for screening examination for other mental health and behavioral disorders: Secondary | ICD-10-CM | POA: Diagnosis not present

## 2020-03-22 DIAGNOSIS — Z79899 Other long term (current) drug therapy: Secondary | ICD-10-CM | POA: Diagnosis not present

## 2020-03-24 ENCOUNTER — Encounter (HOSPITAL_COMMUNITY): Payer: Self-pay

## 2020-03-24 ENCOUNTER — Emergency Department (HOSPITAL_COMMUNITY)
Admission: EM | Admit: 2020-03-24 | Discharge: 2020-03-24 | Disposition: A | Discharge status: Home / Self Care | Payer: Medicare Other | Attending: Emergency Medicine | Admitting: Emergency Medicine

## 2020-03-24 ENCOUNTER — Other Ambulatory Visit: Payer: Self-pay

## 2020-03-24 DIAGNOSIS — I10 Essential (primary) hypertension: Secondary | ICD-10-CM | POA: Diagnosis not present

## 2020-03-24 DIAGNOSIS — N179 Acute kidney failure, unspecified: Secondary | ICD-10-CM | POA: Diagnosis not present

## 2020-03-24 DIAGNOSIS — R008 Other abnormalities of heart beat: Secondary | ICD-10-CM | POA: Insufficient documentation

## 2020-03-24 DIAGNOSIS — R55 Syncope and collapse: Secondary | ICD-10-CM | POA: Diagnosis present

## 2020-03-24 DIAGNOSIS — I498 Other specified cardiac arrhythmias: Secondary | ICD-10-CM

## 2020-03-24 DIAGNOSIS — Z79899 Other long term (current) drug therapy: Secondary | ICD-10-CM | POA: Insufficient documentation

## 2020-03-24 DIAGNOSIS — Z87891 Personal history of nicotine dependence: Secondary | ICD-10-CM | POA: Diagnosis not present

## 2020-03-24 DIAGNOSIS — Z7982 Long term (current) use of aspirin: Secondary | ICD-10-CM | POA: Diagnosis not present

## 2020-03-24 LAB — URINALYSIS, ROUTINE W REFLEX MICROSCOPIC
Bilirubin Urine: NEGATIVE
Glucose, UA: NEGATIVE mg/dL
Hgb urine dipstick: NEGATIVE
Ketones, ur: NEGATIVE mg/dL
Leukocytes,Ua: NEGATIVE
Nitrite: NEGATIVE
Protein, ur: NEGATIVE mg/dL
Specific Gravity, Urine: 1.015 (ref 1.005–1.030)
pH: 6 (ref 5.0–8.0)

## 2020-03-24 LAB — CBC WITH DIFFERENTIAL/PLATELET
Abs Immature Granulocytes: 0.02 10*3/uL (ref 0.00–0.07)
Basophils Absolute: 0 10*3/uL (ref 0.0–0.1)
Basophils Relative: 0 %
Eosinophils Absolute: 0 10*3/uL (ref 0.0–0.5)
Eosinophils Relative: 0 %
HCT: 41.8 % (ref 39.0–52.0)
Hemoglobin: 13.6 g/dL (ref 13.0–17.0)
Immature Granulocytes: 0 %
Lymphocytes Relative: 14 %
Lymphs Abs: 1.2 10*3/uL (ref 0.7–4.0)
MCH: 31.8 pg (ref 26.0–34.0)
MCHC: 32.5 g/dL (ref 30.0–36.0)
MCV: 97.7 fL (ref 80.0–100.0)
Monocytes Absolute: 0.6 10*3/uL (ref 0.1–1.0)
Monocytes Relative: 7 %
Neutro Abs: 6.6 10*3/uL (ref 1.7–7.7)
Neutrophils Relative %: 79 %
Platelets: 160 10*3/uL (ref 150–400)
RBC: 4.28 MIL/uL (ref 4.22–5.81)
RDW: 14.2 % (ref 11.5–15.5)
WBC: 8.4 10*3/uL (ref 4.0–10.5)
nRBC: 0 % (ref 0.0–0.2)

## 2020-03-24 LAB — COMPREHENSIVE METABOLIC PANEL
ALT: 33 U/L (ref 0–44)
AST: 37 U/L (ref 15–41)
Albumin: 4.5 g/dL (ref 3.5–5.0)
Alkaline Phosphatase: 25 U/L — ABNORMAL LOW (ref 38–126)
Anion gap: 9 (ref 5–15)
BUN: 33 mg/dL — ABNORMAL HIGH (ref 8–23)
CO2: 27 mmol/L (ref 22–32)
Calcium: 9.8 mg/dL (ref 8.9–10.3)
Chloride: 102 mmol/L (ref 98–111)
Creatinine, Ser: 1.7 mg/dL — ABNORMAL HIGH (ref 0.61–1.24)
GFR calc Af Amer: 43 mL/min — ABNORMAL LOW (ref >60)
GFR calc non Af Amer: 37 mL/min — ABNORMAL LOW (ref >60)
Glucose, Bld: 86 mg/dL (ref 70–99)
Potassium: 4.1 mmol/L (ref 3.5–5.1)
Sodium: 138 mmol/L (ref 135–145)
Total Bilirubin: 0.7 mg/dL (ref 0.3–1.2)
Total Protein: 7.9 g/dL (ref 6.5–8.1)

## 2020-03-24 LAB — CBG MONITORING, ED: Glucose-Capillary: 85 mg/dL (ref 70–99)

## 2020-03-24 LAB — TROPONIN I (HIGH SENSITIVITY)
Troponin I (High Sensitivity): 10 ng/L (ref <18)
Troponin I (High Sensitivity): 12 ng/L (ref <18)

## 2020-03-24 MED ORDER — SODIUM CHLORIDE 0.9 % IV BOLUS
500.0000 mL | Freq: Once | INTRAVENOUS | Status: AC
  Administered 2020-03-24: 500 mL via INTRAVENOUS

## 2020-03-24 NOTE — ED Provider Notes (Signed)
[facility] Provider Note   CSN: [id number] Arrival date & time: 03/24/20  1311     History Chief Complaint  Patient presents with  . Loss of Consciousness    Carl Young is a 79 y.o. male with PMH/o HTN, Hypercholesteremia who presents for evaluation of possible syncopal episode.  Patient reports that he was working in his yard today trying to take out a fence.  He reports that he attached the fence post to the tractor and states that he was pulling it down the field.  He states that he was driving the tractor and he remembers having to lean over to pull one of the hydraulic levers up.  He states that his wife looked out at the house and saw him possibly slumped over on the tractor and was concerned he may have passed out.  She was getting the phone to call 911 but when she looked back up, patient was sitting up and was continuing driving the tractor down the field.  Patient states he does not recall any episodes of passing out.  He states he does not feel like there are any lapses in memory where he does not recall what happened.  He states that he did not crash the tractor and was able to continue driving it.  He was able to park the tractor and walk into the house.  He did report he felt a little tired and fatigued but he walked into the house but he states that is not abnormal for him.  If he works a lot in the morning, he states he will often have to stop at lunch and take a break and take a nap.  He states he does feel little bit fatigue during now but currently denies any symptoms.  He does not recall having any dizziness/lightheadedness.  He currently denies any chest pain, difficulty breathing, abdominal pain, nausea/vomiting, vision changes, numbness/weakness of his arms or legs.  The history is provided by the patient.       Past Medical History:  Diagnosis Date  . History of kidney stones   . Hypercholesteremia   . Hypertension   . PONV (postoperative  nausea and vomiting)     Patient Active Problem List   Diagnosis Date Noted  . Hx of colonic polyps 06/21/2019  . Noninfectious gastroenteritis and colitis 03/28/2015  . Essential hypertension 03/28/2015  . High cholesterol 03/28/2015  . History of colonic polyps 03/28/2015    Past Surgical History:  Procedure Laterality Date  . APPENDECTOMY    . COLONOSCOPY  05/06/2012   Procedure: COLONOSCOPY;  Surgeon: Rogene Houston, MD;  Location: [facility];  Service: Endoscopy;  Laterality: N/A;  730  . COLONOSCOPY N/A 11/03/2019   Procedure: COLONOSCOPY;  Surgeon: Rogene Houston, MD;  Location: [facility];  Service: Endoscopy;  Laterality: N/A;  930-rescheduled 5/5 same time per office  . HAMMER TOE SURGERY Left 07/09/2017   Procedure: HAMMER TOE CORRECTION 2ND DIGIT LEFT FOOT;  Surgeon: Caprice Beaver, DPM;  Location: [facility];  Service: Podiatry;  Laterality: Left;  . METATARSAL HEAD EXCISION Left 08/25/2019   Procedure: METATARSAL HEAD RESECTION OF THE 2ND AND 3RD METATARSAL LEFT FOOT;  Surgeon: Caprice Beaver, DPM;  Location: [facility];  Service: Podiatry;  Laterality: Left;  . POLYPECTOMY  11/03/2019   Procedure: POLYPECTOMY;  Surgeon: Rogene Houston, MD;  Location: [facility];  Service: Endoscopy;;  . WEIL OSTEOTOMY Left 07/09/2017   Procedure: WEIL OSTEOTOMY  2ND METATARSAL LEFT FOOT;  Surgeon: Caprice Beaver, DPM;  Location: [facility];  Service: Podiatry;  Laterality: Left;       Family History  Problem Relation Age of Onset  . Kidney disease Maternal Grandmother     Social History   Tobacco Use  . Smoking status: Former Smoker    Types: Cigarettes, Cigars    Quit date: 07/08/1985    Years since quitting: 34.7  . Smokeless tobacco: Never Used  Vaping Use  . Vaping Use: Never used  Substance Use Topics  . Alcohol use: Yes    Comment: a couple of beers per month and a glass of wine per month  . Drug use: No    Home Medications Prior to Admission medications     Medication Sig Start Date End Date Taking? Authorizing Provider  amLODipine (NORVASC) 10 MG tablet Take 10 mg by mouth daily.    [provider]  ascorbic acid (VITAMIN C) 500 MG tablet Take 500 mg by mouth daily.    [provider]  aspirin EC 81 MG tablet Take 1 tablet (81 mg total) by mouth every morning. 11/04/19   Rehman, Mechele Dawley, MD  benazepril (LOTENSIN) 40 MG tablet Take 40 mg by mouth every morning.  06/05/17   [provider]  chlorthalidone (HYGROTON) 25 MG tablet Take 25 mg by mouth daily.    [provider]  Cholecalciferol (VITAMIN D3) 50 MCG (2000 UT) capsule Take 2,000 Units by mouth every evening.     [provider]  fenofibrate (TRICOR) 145 MG tablet Take 145 mg by mouth every evening.     [provider]  fexofenadine-pseudoephedrine (ALLEGRA-D 24) 180-240 MG 24 hr tablet Take 1 tablet by mouth daily as needed (allergies).    [provider]  fish oil-omega-3 fatty acids 1000 MG capsule Take 1 g by mouth every morning.     [provider]  folic acid (FOLVITE) 798 MCG tablet Take 400 mcg by mouth every morning.     [provider]  Garlic 9211 MG CAPS Take 1,000 mg by mouth every morning.     [provider]  Lysine 500 MG CAPS Take 500 mg by mouth every morning.     [provider]  metoprolol succinate (TOPROL-XL) 25 MG 24 hr tablet Take 25 mg by mouth in the morning and at bedtime.     [provider]  Multiple Vitamin (MULTIVITAMIN WITH MINERALS) TABS Take 1 tablet by mouth daily.    [provider]  Multiple Vitamins-Minerals (PRESERVISION AREDS 2+MULTI VIT PO) Take 1 capsule by mouth 2 (two) times daily.    [provider]  rosuvastatin (CRESTOR) 5 MG tablet Take 5 mg by mouth daily.    [provider]  zinc gluconate 50 MG tablet Take 50 mg by mouth every morning.     [provider]    Allergies    Ciprofloxacin in  d5w  Review of Systems   Review of Systems  Constitutional: Positive for fatigue. Negative for fever.  Respiratory: Negative for cough and shortness of breath.   Cardiovascular: Negative for chest pain.  Gastrointestinal: Negative for abdominal pain, nausea and vomiting.  Genitourinary: Negative for dysuria and hematuria.  Neurological: Negative for syncope and headaches.  All other systems reviewed and are negative.   Physical Exam Updated Vital Signs BP 132/63   Pulse 63   Temp 98.3 F (36.8 C) (Oral)   Resp 18   SpO2 99%  Physical Exam Vitals and nursing note reviewed.  Constitutional:      Appearance: Normal appearance. He is well-developed.  HENT:     Head: Normocephalic and atraumatic.  Eyes:     General: Lids are normal.     Conjunctiva/sclera: Conjunctivae normal.     Pupils: Pupils are equal, round, and reactive to light.     Comments: PERRL. EOMs intact. No nystagmus. No neglect.   Cardiovascular:     Rate and Rhythm: Normal rate and regular rhythm.     Pulses: Normal pulses.     Heart sounds: Normal heart sounds. No murmur heard.  No friction rub. No gallop.   Pulmonary:     Effort: Pulmonary effort is normal.     Breath sounds: Normal breath sounds.     Comments: Lungs clear to auscultation bilaterally.  Symmetric chest rise.  No wheezing, rales, rhonchi. Abdominal:     Palpations: Abdomen is soft. Abdomen is not rigid.     Tenderness: There is no abdominal tenderness. There is no guarding.     Comments: Abdomen is soft, non-distended, non-tender. No rigidity, No guarding. No peritoneal signs.  Musculoskeletal:        General: Normal range of motion.     Cervical back: Full passive range of motion without pain.  Skin:    General: Skin is warm and dry.     Capillary Refill: Capillary refill takes less than 2 seconds.  Neurological:     Mental Status: He is alert and oriented to person, place, and time.     Comments: Cranial nerves III-XII  intact Follows commands, Moves all extremities  5/5 strength to BUE and BLE  Sensation intact throughout all major nerve distributions No gait abnormalities  No slurred speech. No facial droop.   Psychiatric:        Speech: Speech normal.     ED Results / Procedures / Treatments   Labs (all labs ordered are listed, but only abnormal results are displayed) Labs Reviewed  COMPREHENSIVE METABOLIC PANEL - Abnormal; Notable for the following components:      Result Value   BUN 33 (*)    Creatinine, Ser 1.70 (*)    Alkaline Phosphatase 25 (*)    GFR calc non Af Amer 37 (*)    GFR calc Af Amer 43 (*)    All other components within normal limits  CBC WITH DIFFERENTIAL/PLATELET  URINALYSIS, ROUTINE W REFLEX MICROSCOPIC  CBG MONITORING, ED  TROPONIN I (HIGH SENSITIVITY)  TROPONIN I (HIGH SENSITIVITY)    EKG EKG Interpretation  Date/Time:  Friday March 24 2020 14:30:14 EDT Ventricular Rate:  69 PR Interval:  142 QRS Duration: 72 QT Interval:  408 QTC Calculation: 437 R Axis:   64 Text Interpretation: Sinus rhythm with frequent Premature ventricular complexes in a pattern of bigeminy Nonspecific ST abnormality Abnormal ECG bigeminy new otherwise normal beats unchanged Confirmed by Noemi Chapel [phone]) on 03/24/2020 3:08:50 PM   Radiology No results found.  Procedures Procedures (including critical care time)  Medications Ordered in ED Medications  sodium chloride 0.9 % bolus 500 mL (0 mLs Intravenous Stopped 03/24/20 1714)    ED Course  I have reviewed the triage vital signs and the nursing notes.  Pertinent labs & imaging results that were available during my care of the patient were reviewed by me and considered in my medical decision making (see chart for details).    MDM Rules/Calculators/A&P  79 year old male who presents for questionable syncope.  Patient does not recall any syncopal episode.  He states he was bending over to pull  over but wife saw him for the window from the house and was concerned that he was slumped over.  He did report that he felt little tired when he came in from the house but states that that is not atypical for him.  Currently denies any complaints at this time.  On initial ED arrival, he is afebrile, nontoxic-appearing.  Vital signs are stable.  On exam, no neurological deficits.  Plan to check basic labs, EKG, urine.  I discussed with wife.  She states that she saw him from the house.  She states she looked up at the window and noticed that he was slumped/bent over the steering wheel.  She states that she only saw him from a far distance but was concerned that he may have lost consciousness.  She states she went to go call 911 and was getting a phone.  When she came back to the window, he was sitting up continuing to drive the tractor.  Troponin negative.  CBC shows no leukocytosis or anemia.  CMP shows BUN of 30, creatinine of 1.7.  This is a slight bump from his previous creatinine of 1.4. Will give small fluid bolus. UA negative for any infectious etiology.  Re-evaluation. Patient with no complaints at this time.   Delta troponin negative.   Patient without any complaints at this time.  Patient ambulated in the ED with no signs of gait ataxia.  I discussed results with patient.  Patient instructed to follow-up with his primary care doctor to get his kidney function rechecked.  Additionally, given bigeminy that was new only seen on EKG, patient referred to cardiology.  At this time, patient exhibits no neurological deficits.  Unclear if he actually had a syncopal episode but he does not have any concerns at this time and work-up here is reassuring. At this time, patient exhibits no emergent life-threatening condition that require further evaluation in ED. Discussed patient with Dr. Rogene Houston who is agreeable to plan.   Portions of this note were generated with [employer]. Dictation  errors may occur despite best attempts at proofreading.  Final Clinical Impression(s) / ED Diagnoses Final diagnoses:  AKI (acute kidney injury) [facility])  Bigeminy    Rx / DC Orders ED Discharge Orders    None       Desma Mcgregor 03/24/20 1729    Fredia Sorrow, MD 03/28/20 1708

## 2020-03-24 NOTE — Discharge Instructions (Addendum)
You were slightly dehydrated today which was indicated by some increase in your kidney function. We have given you some fluids. Please have your primary care doctor repeat your labs in 1-2 weeks.   Your EKG was reassuring.  There was some evidence of some rhythm called bigeminy which indicates that sometimes there is an early beat. This can occur in patients.  There is nothing to do about this right now but it is new for you, and can be followed up with referred cardiology.   Follow up with your primary care doctor in the next 2-3 days.   Return to the Emergency Dept for any episodes of passing out, chest pain, trouble breathing, or any other worsening or concerning symptoms.

## 2020-03-24 NOTE — ED Provider Notes (Signed)
Medical screening examination/treatment/procedure(s) were conducted as a shared visit with non-physician practitioner(s) and myself.  I personally evaluated the patient during the encounter.  EKG Interpretation  Date/Time:  Friday March 24 2020 14:30:14 EDT Ventricular Rate:  69 PR Interval:  142 QRS Duration: 72 QT Interval:  408 QTC Calculation: 437 R Axis:   64 Text Interpretation: Sinus rhythm with frequent Premature ventricular complexes in a pattern of bigeminy Nonspecific ST abnormality Abnormal ECG bigeminy new otherwise normal beats unchanged Confirmed by Noemi Chapel [phone]) on 03/24/2020 3:08:50 PM   Patient seen by me along with physician assistant.  Patient sent in for questionable syncopal episode but none are really seen passed out.  Patient was tearing down the fence with his tractor patient's wife states he looked like he slumped over and she thinks he may have been bending over but is on sure.  Patient without any complaints here.  Work-up without any significant abnormalities.  Patient without any chest pain today.  Says has some discomfort he said in past days.  Initial troponin is 10.  Delta not required.  Labs without significant abnormalities patient completely asymptomatic.  EKG does show new bigeminy.  And that is a new finding.  Patient is wife followed by cardiology in [place].  We can refer patient for follow-up with cardiology.  But does not require admission for this.  Cardiac monitoring here without any development of symptoms or any significant arrhythmia.   Fredia Sorrow, MD 03/24/20 [phone]

## 2020-03-24 NOTE — ED Triage Notes (Signed)
Pt reports that this morning  He was tearing down a fence with his tractor. Pt states his wife said it looked like he slumped over and he thinks he may have been bending over but is unsure.  States some dizziness

## 2020-03-29 DIAGNOSIS — I10 Essential (primary) hypertension: Secondary | ICD-10-CM | POA: Diagnosis not present

## 2020-03-29 DIAGNOSIS — N179 Acute kidney failure, unspecified: Secondary | ICD-10-CM | POA: Diagnosis not present

## 2020-03-29 DIAGNOSIS — I498 Other specified cardiac arrhythmias: Secondary | ICD-10-CM | POA: Diagnosis not present

## 2020-03-29 DIAGNOSIS — Z299 Encounter for prophylactic measures, unspecified: Secondary | ICD-10-CM | POA: Diagnosis not present

## 2020-03-29 DIAGNOSIS — E7800 Pure hypercholesterolemia, unspecified: Secondary | ICD-10-CM | POA: Diagnosis not present

## 2020-04-17 DIAGNOSIS — R9431 Abnormal electrocardiogram [ECG] [EKG]: Secondary | ICD-10-CM | POA: Diagnosis not present

## 2020-04-21 DIAGNOSIS — Z23 Encounter for immunization: Secondary | ICD-10-CM | POA: Diagnosis not present

## 2020-05-22 ENCOUNTER — Ambulatory Visit (INDEPENDENT_AMBULATORY_CARE_PROVIDER_SITE_OTHER): Payer: Medicare Other | Admitting: Cardiology

## 2020-05-22 ENCOUNTER — Other Ambulatory Visit (INDEPENDENT_AMBULATORY_CARE_PROVIDER_SITE_OTHER): Payer: Medicare Other

## 2020-05-22 ENCOUNTER — Encounter: Payer: Self-pay | Admitting: *Deleted

## 2020-05-22 ENCOUNTER — Encounter: Payer: Self-pay | Admitting: Cardiology

## 2020-05-22 VITALS — BP 122/64 | HR 78 | Ht 71.0 in | Wt 196.0 lb

## 2020-05-22 DIAGNOSIS — I493 Ventricular premature depolarization: Secondary | ICD-10-CM | POA: Diagnosis not present

## 2020-05-22 DIAGNOSIS — R55 Syncope and collapse: Secondary | ICD-10-CM | POA: Diagnosis not present

## 2020-05-22 DIAGNOSIS — I10 Essential (primary) hypertension: Secondary | ICD-10-CM

## 2020-05-22 DIAGNOSIS — R002 Palpitations: Secondary | ICD-10-CM | POA: Diagnosis not present

## 2020-05-22 DIAGNOSIS — R0789 Other chest pain: Secondary | ICD-10-CM | POA: Diagnosis not present

## 2020-05-22 DIAGNOSIS — E782 Mixed hyperlipidemia: Secondary | ICD-10-CM | POA: Diagnosis not present

## 2020-05-22 NOTE — Patient Instructions (Addendum)
Medication Instructions:   Your physician recommends that you continue on your current medications as directed. Please refer to the Current Medication list given to you today.  Labwork:  None  Testing/Procedures: Your physician has requested that you have a lexiscan myoview. For further information please visit [email or web address]. Please follow instruction sheet, as given. ZIO- Long Term Monitor Instructions   Your physician has requested you wear your ZIO patch monitor 3 days.   This is a single patch monitor.  [employer] supplies one patch monitor per enrollment.  Additional stickers are not available.   Please do not apply patch if you will be having a Nuclear Stress Test, Echocardiogram, Cardiac CT, MRI, or Chest Xray during the time frame you would be wearing the monitor. The patch cannot be worn during these tests.  You cannot remove and re-apply the ZIO XT patch monitor.     Once you have received you monitor, please review enclosed instructions.  Your monitor has already been registered assigning a specific monitor serial # to you.   Applying the monitor   Shave hair from upper left chest.   Hold abrader disc by orange tab.  Rub abrader in 40 strokes over left upper chest as indicated in your monitor instructions.   Clean area with 4 enclosed alcohol pads .  Use all pads to assure are is cleaned thoroughly.  Let dry.   Apply patch as indicated in monitor instructions.  Patch will be place under collarbone on left side of chest with arrow pointing upward.   Rub patch adhesive wings for 2 minutes.Remove white label marked "1".  Remove white label marked "2".  Rub patch adhesive wings for 2 additional minutes.   While looking in a mirror, press and release button in center of patch.  A small green light will flash 3-4 times .  This will be your only indicator the monitor has been turned on.     Do not shower for the first 24 hours.  You may shower after the first 24 hours.    Press button if you feel a symptom. You will hear a small click.  Record Date, Time and Symptom in the Patient Log Book.   When you are ready to remove patch, follow instructions on last 2 pages of Patient Log Book.  Stick patch monitor onto last page of Patient Log Book.   Place Patient Log Book in [place] box.  Use locking tab on box and tape box closed securely.  The [employer] has [employer] on it.  Please place in mailbox as soon as possible.  Your physician should have your test results approximately 7 days after the monitor has been mailed back to [facility].   Call [place] at [phone] if you have questions regarding your ZIO XT patch monitor.  Call them immediately if you see an orange light blinking on your monitor.    If your monitor falls off in less than 4 days contact our Monitor department at [phone].  If your monitor becomes loose or falls off after 4 days call [employer] at [phone] for suggestions on securing your monitor.  Follow-Up:  Your physician recommends that you schedule a follow-up appointment in:  Any Other Special Instructions Will Be Listed Below (If Applicable).  If you need a refill on your cardiac medications before your next appointment, please call your pharmacy.

## 2020-05-22 NOTE — Progress Notes (Signed)
Cardiology Office Note  Date: 05/22/2020   ID: STEPEN PRINS, DOB [birth year], MRN [id number]  PCP:  Glenda Chroman, MD  Cardiologist:  Rozann Lesches, MD Electrophysiologist:  None   Chief Complaint  Patient presents with  . PVCs    History of Present Illness: Carl Young is an 79 y.o. male referred for cardiology consultation by Dr. Woody Seller for evaluation of ventricular bigeminy.  Records indicate evaluation at the [facility] in September after a an episode of possible near syncope.  He had no evidence of ACS or reported chest pain at that time, was noted to have ventricular bigeminy by ECG.  Echocardiogram performed at [facility] in October reported LVEF 55 to 60% without regional wall motion abnormalities, ungraded diastolic dysfunction, normal RV contraction, mild aortic stenosis, trace mitral regurgitation, and mild tricuspid regurgitation.  I personally reviewed his ECG from 03/24/2020 which showed sinus rhythm with ventricular bigeminy.  PVCs are upright in the inferior leads and monomorphic, suggestive of outflow tract origin.  He is here today with his wife.  He does not report any sense of palpitations and has not had any recurrent episodes of syncope or near syncope.  He does feel a component of disequilibrium, has decreased hearing loss on the left and wonders whether he may have some inner ear contribution.  He seems to feel most of this when he bends over and stands up.  He also has had some episodes of chest discomfort, states that they feel like indigestion and reports prior history of peptic ulcer disease with similar symptomatology.  He was treated for Helicobacter pylori in the past.  He has not undergone any ischemic testing and does have family history of heart disease.  I personally reviewed his ECG today which shows sinus rhythm.   Past Medical History:  Diagnosis Date  . Bell's palsy   . CKD (chronic kidney disease) stage 3, GFR 30-59 ml/min  (HCC)   . Essential hypertension   . Helicobacter pylori (H. pylori) infection   . History of GI bleed   . Mixed hyperlipidemia   . Nephrolithiasis     Past Surgical History:  Procedure Laterality Date  . APPENDECTOMY    . COLONOSCOPY  05/06/2012   Procedure: COLONOSCOPY;  Surgeon: Rogene Houston, MD;  [facility];  Service: Endoscopy;  Laterality: N/A;  730  . COLONOSCOPY N/A 11/03/2019   Procedure: COLONOSCOPY;  Surgeon: Rogene Houston, MD;  [facility];  Service: Endoscopy;  Laterality: N/A;  930-rescheduled 5/5 same time per office  . HAMMER TOE SURGERY Left 07/09/2017   Procedure: HAMMER TOE CORRECTION 2ND DIGIT LEFT FOOT;  Surgeon: Caprice Beaver, DPM;  Location: [facility];  Service: Podiatry;  Laterality: Left;  . METATARSAL HEAD EXCISION Left 08/25/2019   Procedure: METATARSAL HEAD RESECTION OF THE 2ND AND 3RD METATARSAL LEFT FOOT;  Surgeon: Caprice Beaver, DPM;  Location: [facility];  Service: Podiatry;  Laterality: Left;  . POLYPECTOMY  11/03/2019   Procedure: POLYPECTOMY;  Surgeon: Rogene Houston, MD;  [facility];  Service: Endoscopy;;  . WEIL OSTEOTOMY Left 07/09/2017   Procedure: WEIL OSTEOTOMY 2ND METATARSAL LEFT FOOT;  Surgeon: Caprice Beaver, DPM;  Location: [facility];  Service: Podiatry;  Laterality: Left;    Current Outpatient Medications  Medication Sig Dispense Refill  . amLODipine (NORVASC) 10 MG tablet Take 10 mg by mouth daily.    Marland Kitchen ascorbic acid (VITAMIN C) 500 MG tablet Take  500 mg by mouth daily.    Marland Kitchen aspirin EC 81 MG tablet Take 1 tablet (81 mg total) by mouth every morning.    . benazepril (LOTENSIN) 40 MG tablet Take 40 mg by mouth every morning.   4  . chlorthalidone (HYGROTON) 25 MG tablet Take 25 mg by mouth daily.    . Cholecalciferol (VITAMIN D3) 50 MCG (2000 UT) capsule Take 2,000 Units by mouth every evening.     . fenofibrate (TRICOR) 145 MG tablet Take 145 mg by mouth every evening.     .  fexofenadine-pseudoephedrine (ALLEGRA-D 24) 180-240 MG 24 hr tablet Take 1 tablet by mouth daily as needed (allergies).    . fish oil-omega-3 fatty acids 1000 MG capsule Take 1 g by mouth every morning.     . folic acid (FOLVITE) 564 MCG tablet Take 400 mcg by mouth every morning.     . Garlic 3329 MG CAPS Take 1,000 mg by mouth every morning.     Marland Kitchen Lysine 500 MG CAPS Take 500 mg by mouth every morning.     . metoprolol succinate (TOPROL-XL) 25 MG 24 hr tablet Take 12.5 mg by mouth in the morning and at bedtime.     . Multiple Vitamin (MULTIVITAMIN WITH MINERALS) TABS Take 1 tablet by mouth daily.    . Multiple Vitamins-Minerals (PRESERVISION AREDS 2+MULTI VIT PO) Take 1 capsule by mouth 2 (two) times daily.    . rosuvastatin (CRESTOR) 5 MG tablet Take 5 mg by mouth daily.    Marland Kitchen zinc gluconate 50 MG tablet Take 50 mg by mouth every morning.      No current facility-administered medications for this visit.   Allergies:  Ciprofloxacin in d5w   Social History: The patient  reports that he quit smoking about 34 years ago. His smoking use included cigarettes and cigars. He has never used smokeless tobacco. He reports current alcohol use. He reports that he does not use drugs.   Family History: The patient's family history includes Breast cancer in his sister; COPD in his mother; Heart disease in his father; Kidney disease in his maternal grandmother; Melanoma in his brother; Stroke in his father.   ROS: No frank syncope.  Physical Exam: VS:  BP 122/64   Pulse 78   Ht 5\' 11"  (1.803 m)   Wt 196 lb (88.9 kg)   SpO2 96%   BMI 27.34 kg/m , BMI Body mass index is 27.34 kg/m.  Wt Readings from Last 3 Encounters:  05/22/20 196 lb (88.9 kg)  11/03/19 206 lb (93.4 kg)  08/23/19 207 lb (93.9 kg)    General: Patient appears comfortable at rest. HEENT: Conjunctiva and lids normal, wearing a mask. Neck: Supple, no elevated JVP or carotid bruits, no thyromegaly. Lungs: Clear to auscultation,  nonlabored breathing at rest. Cardiac: Regular rate and rhythm with occasional ectopic beat, no S3, 2/6 systolic murmur, no pericardial rub. Abdomen: Soft, nontender, bowel sounds present. Extremities: No pitting edema, distal pulses 2+. Skin: Warm and dry. Musculoskeletal: No kyphosis. Neuropsychiatric: Alert and oriented x3, affect grossly appropriate.  ECG:  An ECG dated 03/27/2020 was personally reviewed today and demonstrated:  Sinus rhythm with ventricular bigeminy and nonspecific ST changes.  Recent Labwork: 03/24/2020: ALT 33; AST 37; BUN 33; Creatinine, Ser 1.70; Hemoglobin 13.6; Platelets 160; Potassium 4.1; Sodium 138, high-sensitivity troponin I of 12 and 10 March 2020: Hemoglobin 14.7, platelets 185, TSH 1.36, cholesterol 223, triglycerides 334, HDL 28, LDL 134, BUN 22, creatinine 1.54, potassium 4.6,  AST 33, ALT 31  Other Studies Reviewed Today:  No prior cardiac testing for review.  Assessment and Plan:  1.  PVCs and ventricular bigeminy.  Not entirely clear whether this is symptom provoking or not.  LVEF normal by recent echocardiogram.  We will plan a 72-hour Zio patch to better quantify PVCs and exclude other arrhythmia.  Since he has had some intermittent chest discomfort (although with atypical features overall), will also arrange ischemic evaluation via a Lexiscan Myoview.  He is on a low-dose beta-blocker at this time.  2.  Mixed hyperlipidemia, on Crestor.  LDL 134 in September.  3.  Essential hypertension, he is on Norvasc, chlorthalidone, Lotensin, and Toprol-XL.  Medication Adjustments/Labs and Tests Ordered: Current medicines are reviewed at length with the patient today.  Concerns regarding medicines are outlined above.   Tests Ordered: Orders Placed This Encounter  Procedures  . NM Myocar Multi W/Spect W/Wall Motion / EF  . LONG TERM MONITOR (3-14 DAYS)  . EKG 12-Lead    Medication Changes: No orders of the defined types were placed in this  encounter.   Disposition:  Follow up test results and determine next step.  Signed, Satira Sark, MD, [facility] 05/22/2020 3:30 PM    [facility] at [place], [place], [place] [address] Phone: [phone]; Fax: [phone]

## 2020-06-02 ENCOUNTER — Other Ambulatory Visit: Payer: Self-pay

## 2020-06-02 ENCOUNTER — Encounter (HOSPITAL_COMMUNITY)
Admission: RE | Admit: 2020-06-02 | Discharge: 2020-06-02 | Disposition: A | Discharge status: Home / Self Care | Payer: Medicare Other | Source: Ambulatory Visit | Attending: Cardiology | Admitting: Cardiology

## 2020-06-02 ENCOUNTER — Encounter (HOSPITAL_COMMUNITY): Payer: Self-pay

## 2020-06-02 ENCOUNTER — Encounter (HOSPITAL_BASED_OUTPATIENT_CLINIC_OR_DEPARTMENT_OTHER)
Admission: RE | Admit: 2020-06-02 | Discharge: 2020-06-02 | Disposition: A | Discharge status: Home / Self Care | Payer: Medicare Other | Source: Ambulatory Visit | Attending: Cardiology | Admitting: Cardiology

## 2020-06-02 DIAGNOSIS — R0789 Other chest pain: Secondary | ICD-10-CM | POA: Diagnosis not present

## 2020-06-02 LAB — NM MYOCAR MULTI W/SPECT W/WALL MOTION / EF
LV dias vol: 116 mL (ref 62–150)
LV sys vol: 52 mL
Peak HR: 86 {beats}/min
RATE: 0.34
Rest HR: 79 {beats}/min
SDS: 15
SRS: 18
SSS: 33
TID: 1.07

## 2020-06-02 MED ORDER — TECHNETIUM TC 99M TETROFOSMIN IV KIT
30.0000 | PACK | Freq: Once | INTRAVENOUS | Status: AC
  Administered 2020-06-02: 31.9 via INTRAVENOUS

## 2020-06-02 MED ORDER — SODIUM CHLORIDE FLUSH 0.9 % IV SOLN
INTRAVENOUS | Status: AC
  Administered 2020-06-02: 10 mL via INTRAVENOUS
  Filled 2020-06-02: qty 10

## 2020-06-02 MED ORDER — TECHNETIUM TC 99M TETROFOSMIN IV KIT
10.0000 | PACK | Freq: Once | INTRAVENOUS | Status: AC | PRN
  Administered 2020-06-02: 10.9 via INTRAVENOUS

## 2020-06-02 MED ORDER — REGADENOSON 0.4 MG/5ML IV SOLN
INTRAVENOUS | Status: AC
  Administered 2020-06-02: 0.4 mg via INTRAVENOUS
  Filled 2020-06-02: qty 5

## 2020-06-05 ENCOUNTER — Telehealth: Payer: Self-pay | Admitting: *Deleted

## 2020-06-05 DIAGNOSIS — R002 Palpitations: Secondary | ICD-10-CM | POA: Diagnosis not present

## 2020-06-05 NOTE — Progress Notes (Signed)
Cardiology Office Note  Date: 06/05/2020   ID: Carl Young, DOB [birth year], MRN [id number]  PCP:  Glenda Chroman, MD  Cardiologist:  Rozann Lesches, MD Electrophysiologist:  None   Chief Complaint: follow up PVC's,  History of Present Illness: Carl Young is a 79 y.o. male with a history of PVC's, Near syncope, atypical chest pain, HTN, HLD.  Last encounter with Dr. Domenic Polite 05/22/2020.  He had been referred by PCP for evaluation of ventricular bigeminy.  He had been evaluated at [facility] emergency room in September after episode of possible near syncope.  At that time he was noted to have ventricular bigeminy by EKG.  Echocardiogram October 2021 EF 55 to 60%.  Diastolic dysfunction, normal RV, mild aortic stenosis, trace MR mild TR. He had experienced no recurrent episodes of syncope or near syncope.  Had some recent episodes of disequilibrium and wondered whether he may have some inner ear contribution.  He felt most of these types of symptoms when bending over and standing up.  Had some occasional episodes of chest discomfort described as similar to indigestion.  Has a prior history of PUD with similar symptoms.  Previously treated for H. pylori in the past.  No prior ischemic work-up.  Family history of heart disease.  72-hour Zio patch was ordered to quantify PVCs.  Lexiscan Myoview was ordered for intermittent chest discomfort.  He was continuing a low dose beta-blocker.  He was continuing Crestor.  LDL was 134 in September.  Telephone encounter on 06/04/2020:  Recent stress Myoview was significantly abnormal indicating previous large infarct scar and moderate residual ischemia.  Therefore presence of underlying CAD.  Dr. Domenic Polite requesting to schedule office follow-up to review stress results and discuss symptoms.  Most likely proceed with a diagnostic cardiac catheterization.  He was originally referred for frequent PVCs.  Patient is here to discuss recent abnormal stress test  results and results of event monitor.  We discussed the fact that it appears he has had fairly significant MI in the past and he has some peri-infarct ischemia.  Patient states he knows something is wrong because his stamina his significantly increased over the last months where he usually can only work for about 3 hours and has to rest about 6 hours before he can continue.  Also he and his wife state he has recent bouts of nausea which is something new that is been occurring.  Patient states in the interim since last visit he has had no sensation of palpitations or arrhythmias.  He states he had to reduce the dose of Toprol XL due to feeling significantly tired.  States he continues to feel a little tired but not as much as when he was taking the 25 mg dosage twice daily.  We discussed the event monitor where he was having short runs of SVT.  He states he never noted any sensation of palpitations during the event monitoring time.  He states he is willing to undergo a cardiac catheterization based on the information.  Risk and benefits were thoroughly discussed during our conversation.  Past Medical History:  Diagnosis Date  . Bell's palsy   . CKD (chronic kidney disease) stage 3, GFR 30-59 ml/min (HCC)   . Essential hypertension   . Helicobacter pylori (H. pylori) infection   . History of GI bleed   . Mixed hyperlipidemia   . Nephrolithiasis     Past Surgical History:  Procedure Laterality Date  . APPENDECTOMY    .  COLONOSCOPY  05/06/2012   Procedure: COLONOSCOPY;  Surgeon: Rogene Houston, MD;  [facility];  Service: Endoscopy;  Laterality: N/A;  730  . COLONOSCOPY N/A 11/03/2019   Procedure: COLONOSCOPY;  Surgeon: Rogene Houston, MD;  [facility];  Service: Endoscopy;  Laterality: N/A;  930-rescheduled 5/5 same time per office  . HAMMER TOE SURGERY Left 07/09/2017   Procedure: HAMMER TOE CORRECTION 2ND DIGIT LEFT FOOT;  Surgeon: Caprice Beaver, DPM;  Location: [facility];   Service: Podiatry;  Laterality: Left;  . METATARSAL HEAD EXCISION Left 08/25/2019   Procedure: METATARSAL HEAD RESECTION OF THE 2ND AND 3RD METATARSAL LEFT FOOT;  Surgeon: Caprice Beaver, DPM;  Location: [facility];  Service: Podiatry;  Laterality: Left;  . POLYPECTOMY  11/03/2019   Procedure: POLYPECTOMY;  Surgeon: Rogene Houston, MD;  [facility];  Service: Endoscopy;;  . WEIL OSTEOTOMY Left 07/09/2017   Procedure: WEIL OSTEOTOMY 2ND METATARSAL LEFT FOOT;  Surgeon: Caprice Beaver, DPM;  Location: [facility];  Service: Podiatry;  Laterality: Left;    Current Outpatient Medications  Medication Sig Dispense Refill  . amLODipine (NORVASC) 10 MG tablet Take 10 mg by mouth daily.    Marland Kitchen ascorbic acid (VITAMIN C) 500 MG tablet Take 500 mg by mouth daily.    Marland Kitchen aspirin EC 81 MG tablet Take 1 tablet (81 mg total) by mouth every morning.    . benazepril (LOTENSIN) 40 MG tablet Take 40 mg by mouth every morning.   4  . chlorthalidone (HYGROTON) 25 MG tablet Take 25 mg by mouth daily.    . Cholecalciferol (VITAMIN D3) 50 MCG (2000 UT) capsule Take 2,000 Units by mouth every evening.     . fenofibrate (TRICOR) 145 MG tablet Take 145 mg by mouth every evening.     . fexofenadine-pseudoephedrine (ALLEGRA-D 24) 180-240 MG 24 hr tablet Take 1 tablet by mouth daily as needed (allergies).    . fish oil-omega-3 fatty acids 1000 MG capsule Take 1 g by mouth every morning.     . folic acid (FOLVITE) 979 MCG tablet Take 400 mcg by mouth every morning.     . Garlic 8921 MG CAPS Take 1,000 mg by mouth every morning.     Marland Kitchen Lysine 500 MG CAPS Take 500 mg by mouth every morning.     . metoprolol succinate (TOPROL-XL) 25 MG 24 hr tablet Take 12.5 mg by mouth in the morning and at bedtime.     . Multiple Vitamin (MULTIVITAMIN WITH MINERALS) TABS Take 1 tablet by mouth daily.    . Multiple Vitamins-Minerals (PRESERVISION AREDS 2+MULTI VIT PO) Take 1 capsule by mouth 2 (two) times daily.    . rosuvastatin  (CRESTOR) 5 MG tablet Take 5 mg by mouth daily.    Marland Kitchen zinc gluconate 50 MG tablet Take 50 mg by mouth every morning.      No current facility-administered medications for this visit.   Allergies:  Ciprofloxacin in d5w   Social History: The patient  reports that he quit smoking about 34 years ago. His smoking use included cigarettes and cigars. He has never used smokeless tobacco. He reports current alcohol use. He reports that he does not use drugs.   Family History: The patient's family history includes Breast cancer in his sister; COPD in his mother; Heart disease in his father; Kidney disease in his maternal grandmother; Melanoma in his brother; Stroke in his father.   ROS:  Please see the history of present  illness. Otherwise, complete review of systems is positive for none.  All other systems are reviewed and negative.   Physical Exam: VS:  There were no vitals taken for this visit., BMI There is no height or weight on file to calculate BMI.  Wt Readings from Last 3 Encounters:  05/22/20 196 lb (88.9 kg)  11/03/19 206 lb (93.4 kg)  08/23/19 207 lb (93.9 kg)    General: Patient appears comfortable at rest. Neck: Supple, no elevated JVP or carotid bruits, no thyromegaly. Lungs: Clear to auscultation, nonlabored breathing at rest. Cardiac: Regular rate and rhythm, no S3 or significant systolic murmur, no pericardial rub. Extremities: No pitting edema, distal pulses 2+. Skin: Warm and dry. Musculoskeletal: No kyphosis. Neuropsychiatric: Alert and oriented x3, affect grossly appropriate.  ECG:    Recent Labwork: 03/24/2020: ALT 33; AST 37; BUN 33; Creatinine, Ser 1.70; Hemoglobin 13.6; Platelets 160; Potassium 4.1; Sodium 138  No results found for: CHOL, TRIG, HDL, CHOLHDL, VLDL, LDLCALC, LDLDIRECT  Other Studies Reviewed Today:  NST 06/02/2020  Study Result  Narrative & Impression   There was no ST segment deviation noted during stress.  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  The left ventricular ejection fraction is normal (55-65%).  Intermediate to high risk study based on size and degree of current ischemia.     Echocardiogram 04/17/2020: EF 55 to 60%.  Impaired LV relaxation.  Mild aortic stenosis, trace MR, mild TR.   Cardiac monitor 05/22/2020 Patient had a min HR of 43 bpm, max HR of 154 bpm, and avg HR of 68 bpm. Predominant underlying rhythm was Sinus Rhythm. 53 Supraventricular Tachycardia runs occurred, the run with the fastest interval lasting 10 beats with a max rate of 154 bpm, the longest lasting 19.5 secs with an avg rate of 98 bpm. Some episodes of Supraventricular Tachycardia may be possible Atrial Tachycardia with variable block. Isolated SVEs were occasional (1.2%, 3053), SVE Couplets were rare (<1.0%, 290), and SVE Triplets were rare (<1.0%, 83). Isolated VEs were frequent (10.8%,26499), VE Couplets were rare (<1.0%, 651), and no VE Triplets were present.Ventricular Bigeminy and Trigeminy were present.   Assessment and Plan:  1. Abnormal stress test   2. Frequent PVCs   3. Essential hypertension   4. Mixed hyperlipidemia    1. Abnormal stress test Recent abnormal stress test.  Patient here to discuss results.  He is willing to undergo cardiac catheterization based on the results and recent symptoms.  Risk and benefits were thoroughly discussed with the patient. He is scheduled for left heart cath on Friday, December 10 at 10:30 AM with Dr. Claiborne Billings.  Continue aspirin 81 mg p.o. daily.  2. Frequent PVCs Recent cardiac monitor showed 53 supraventricular tachycardia runs.  The run with the fastest interval lasting 10 beats with a maximal rate of 154.  The longest lasting 19 seconds with an average rate of 98 bpm.  Isolated ventricular ectopy were frequent at 10.8%.  Patient is currently Toprol-XL 12.5 mg a.m. and p.m.  3. Essential hypertension Blood  pressure today 120/60.  Continue benazepril 40 mg daily.  Continue chlorthalidone 25 mg daily.  Continue Toprol-XL 12.5 mg p.o. twice daily.  Continue amlodipine 10 mg daily.  4. Mixed hyperlipidemia Continue Crestor 5 mg p.o. daily.  Continue fenofibrate 145 mg p.o. daily.  Medication Adjustments/Labs and Tests Ordered: Current medicines are reviewed at length with the patient today.  Concerns regarding medicines are outlined above.   Disposition: Follow-up  with Dr. Domenic Polite or [facility] 1 month after cardiac catheterization  Signed, Levell July, NP 06/05/2020 10:07 PM    [facility] at [place], [place], [place] [address] Phone: [phone]; Fax: [phone]

## 2020-06-05 NOTE — H&P (View-Only) (Signed)
Cardiology Office Note  Date: 06/05/2020   ID: Carl Young, DOB 03/17/1941, MRN [id number]  PCP:  Glenda Chroman, MD  Cardiologist:  Rozann Lesches, MD Electrophysiologist:  None   Chief Complaint: follow up PVC's,  History of Present Illness: Carl Young is a 79 y.o. male with a history of PVC's, Near syncope, atypical chest pain, HTN, HLD.  Last encounter with Dr. Domenic Polite 05/22/2020.  He had been referred by PCP for evaluation of ventricular bigeminy.  He had been evaluated at [facility] emergency room in September after episode of possible near syncope.  At that time he was noted to have ventricular bigeminy by EKG.  Echocardiogram October 2021 EF 55 to 60%.  Diastolic dysfunction, normal RV, mild aortic stenosis, trace MR mild TR. He had experienced no recurrent episodes of syncope or near syncope.  Had some recent episodes of disequilibrium and wondered whether he may have some inner ear contribution.  He felt most of these types of symptoms when bending over and standing up.  Had some occasional episodes of chest discomfort described as similar to indigestion.  Has a prior history of PUD with similar symptoms.  Previously treated for H. pylori in the past.  No prior ischemic work-up.  Family history of heart disease.  72-hour Zio patch was ordered to quantify PVCs.  Lexiscan Myoview was ordered for intermittent chest discomfort.  He was continuing a low dose beta-blocker.  He was continuing Crestor.  LDL was 134 in September.  Telephone encounter on 06/04/2020:  Recent stress Myoview was significantly abnormal indicating previous large infarct scar and moderate residual ischemia.  Therefore presence of underlying CAD.  Dr. Domenic Polite requesting to schedule office follow-up to review stress results and discuss symptoms.  Most likely proceed with a diagnostic cardiac catheterization.  He was originally referred for frequent PVCs.  Patient is here to discuss recent abnormal stress test  results and results of event monitor.  We discussed the fact that it appears he has had fairly significant MI in the past and he has some peri-infarct ischemia.  Patient states he knows something is wrong because his stamina his significantly increased over the last months where he usually can only work for about 3 hours and has to rest about 6 hours before he can continue.  Also he and his wife state he has recent bouts of nausea which is something new that is been occurring.  Patient states in the interim since last visit he has had no sensation of palpitations or arrhythmias.  He states he had to reduce the dose of Toprol XL due to feeling significantly tired.  States he continues to feel a little tired but not as much as when he was taking the 25 mg dosage twice daily.  We discussed the event monitor where he was having short runs of SVT.  He states he never noted any sensation of palpitations during the event monitoring time.  He states he is willing to undergo a cardiac catheterization based on the information.  Risk and benefits were thoroughly discussed during our conversation.  Past Medical History:  Diagnosis Date  . Bell's palsy   . CKD (chronic kidney disease) stage 3, GFR 30-59 ml/min (HCC)   . Essential hypertension   . Helicobacter pylori (H. pylori) infection   . History of GI bleed   . Mixed hyperlipidemia   . Nephrolithiasis     Past Surgical History:  Procedure Laterality Date  . APPENDECTOMY    .  COLONOSCOPY  05/06/2012   Procedure: COLONOSCOPY;  Surgeon: Rogene Houston, MD;  [facility];  Service: Endoscopy;  Laterality: N/A;  730  . COLONOSCOPY N/A 11/03/2019   Procedure: COLONOSCOPY;  Surgeon: Rogene Houston, MD;  Location: [facility];  Service: Endoscopy;  Laterality: N/A;  930-rescheduled 5/5 same time per office  . HAMMER TOE SURGERY Left 07/09/2017   Procedure: HAMMER TOE CORRECTION 2ND DIGIT LEFT FOOT;  Surgeon: Caprice Beaver, DPM;  Location: [facility] ORS;   Service: Podiatry;  Laterality: Left;  . METATARSAL HEAD EXCISION Left 08/25/2019   Procedure: METATARSAL HEAD RESECTION OF THE 2ND AND 3RD METATARSAL LEFT FOOT;  Surgeon: Caprice Beaver, DPM;  Location: [facility] OR[facility];  Service: Podiatry;  Laterality: Left;  . POLYPECTOMY  11/03/2019   Procedure: POLYPECTOMY;  Surgeon: Rogene Houston, MD;  [facility];  Service: Endoscopy;;  . WEIL OSTEOTOMY Left 07/09/2017   Procedure: WEIL OSTEOTOMY 2ND METATARSAL LEFT FOOT;  Surgeon: Caprice Beaver, DPM;  Location: [facility] OR[facility];  Service: Podiatry;  Laterality: Left;    Current Outpatient Medications  Medication Sig Dispense Refill  . amLODipine (NORVASC) 10 MG tablet Take 10 mg by mouth daily.    Marland Kitchen ascorbic acid (VITAMIN C) 500 MG tablet Take 500 mg by mouth daily.    Marland Kitchen aspirin EC 81 MG tablet Take 1 tablet (81 mg total) by mouth every morning.    . benazepril (LOTENSIN) 40 MG tablet Take 40 mg by mouth every morning.   4  . chlorthalidone (HYGROTON) 25 MG tablet Take 25 mg by mouth daily.    . Cholecalciferol (VITAMIN D3) 50 MCG (2000 UT) capsule Take 2,000 Units by mouth every evening.     . fenofibrate (TRICOR) 145 MG tablet Take 145 mg by mouth every evening.     . fexofenadine-pseudoephedrine (ALLEGRA-D 24) 180-240 MG 24 hr tablet Take 1 tablet by mouth daily as needed (allergies).    . fish oil-omega-3 fatty acids 1000 MG capsule Take 1 g by mouth every morning.     . folic acid (FOLVITE) 161 MCG tablet Take 400 mcg by mouth every morning.     . Garlic 0960 MG CAPS Take 1,000 mg by mouth every morning.     Marland Kitchen Lysine 500 MG CAPS Take 500 mg by mouth every morning.     . metoprolol succinate (TOPROL-XL) 25 MG 24 hr tablet Take 12.5 mg by mouth in the morning and at bedtime.     . Multiple Vitamin (MULTIVITAMIN WITH MINERALS) TABS Take 1 tablet by mouth daily.    . Multiple Vitamins-Minerals (PRESERVISION AREDS 2+MULTI VIT PO) Take 1 capsule by mouth 2 (two) times daily.    . rosuvastatin  (CRESTOR) 5 MG tablet Take 5 mg by mouth daily.    Marland Kitchen zinc gluconate 50 MG tablet Take 50 mg by mouth every morning.      No current facility-administered medications for this visit.   Allergies:  Ciprofloxacin in d5w   Social History: The patient  reports that he quit smoking about 34 years ago. His smoking use included cigarettes and cigars. He has never used smokeless tobacco. He reports current alcohol use. He reports that he does not use drugs.   Family History: The patient's family history includes Breast cancer in his sister; COPD in his mother; Heart disease in his father; Kidney disease in his maternal grandmother; Melanoma in his brother; Stroke in his father.   ROS:  Please see the history of present  illness. Otherwise, complete review of systems is positive for none.  All other systems are reviewed and negative.   Physical Exam: VS:  There were no vitals taken for this visit., BMI There is no height or weight on file to calculate BMI.  Wt Readings from Last 3 Encounters:  05/22/20 196 lb (88.9 kg)  11/03/19 206 lb (93.4 kg)  08/23/19 207 lb (93.9 kg)    General: Patient appears comfortable at rest. Neck: Supple, no elevated JVP or carotid bruits, no thyromegaly. Lungs: Clear to auscultation, nonlabored breathing at rest. Cardiac: Regular rate and rhythm, no S3 or significant systolic murmur, no pericardial rub. Extremities: No pitting edema, distal pulses 2+. Skin: Warm and dry. Musculoskeletal: No kyphosis. Neuropsychiatric: Alert and oriented x3, affect grossly appropriate.  ECG:    Recent Labwork: 03/24/2020: ALT 33; AST 37; BUN 33; Creatinine, Ser 1.70; Hemoglobin 13.6; Platelets 160; Potassium 4.1; Sodium 138  No results found for: CHOL, TRIG, HDL, CHOLHDL, VLDL, LDLCALC, LDLDIRECT  Other Studies Reviewed Today:  NST 06/02/2020  Study Result  Narrative & Impression   There was no ST segment deviation noted during stress.  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  The left ventricular ejection fraction is normal (55-65%).  Intermediate to high risk study based on size and degree of current ischemia.     Echocardiogram 04/17/2020: EF 55 to 60%.  Impaired LV relaxation.  Mild aortic stenosis, trace MR, mild TR.   Cardiac monitor 05/22/2020 Patient had a min HR of 43 bpm, max HR of 154 bpm, and avg HR of 68 bpm. Predominant underlying rhythm was Sinus Rhythm. 53 Supraventricular Tachycardia runs occurred, the run with the fastest interval lasting 10 beats with a max rate of 154 bpm, the longest lasting 19.5 secs with an avg rate of 98 bpm. Some episodes of Supraventricular Tachycardia may be possible Atrial Tachycardia with variable block. Isolated SVEs were occasional (1.2%, 3053), SVE Couplets were rare (<1.0%, 290), and SVE Triplets were rare (<1.0%, 83). Isolated VEs were frequent (10.8%,26499), VE Couplets were rare (<1.0%, 651), and no VE Triplets were present.Ventricular Bigeminy and Trigeminy were present.   Assessment and Plan:  1. Abnormal stress test   2. Frequent PVCs   3. Essential hypertension   4. Mixed hyperlipidemia    1. Abnormal stress test Recent abnormal stress test.  Patient here to discuss results.  He is willing to undergo cardiac catheterization based on the results and recent symptoms.  Risk and benefits were thoroughly discussed with the patient. He is scheduled for left heart cath on Friday, December 10 at 10:30 AM with Dr. Claiborne Billings.  Continue aspirin 81 mg p.o. daily.  2. Frequent PVCs Recent cardiac monitor showed 53 supraventricular tachycardia runs.  The run with the fastest interval lasting 10 beats with a maximal rate of 154.  The longest lasting 19 seconds with an average rate of 98 bpm.  Isolated ventricular ectopy were frequent at 10.8%.  Patient is currently Toprol-XL 12.5 mg a.m. and p.m.  3. Essential hypertension Blood  pressure today 120/60.  Continue benazepril 40 mg daily.  Continue chlorthalidone 25 mg daily.  Continue Toprol-XL 12.5 mg p.o. twice daily.  Continue amlodipine 10 mg daily.  4. Mixed hyperlipidemia Continue Crestor 5 mg p.o. daily.  Continue fenofibrate 145 mg p.o. daily.  Medication Adjustments/Labs and Tests Ordered: Current medicines are reviewed at length with the patient today.  Concerns regarding medicines are outlined above.   Disposition: Follow-up  with Dr. Domenic Polite or APP 1 month after cardiac catheterization  Signed, Levell July, NP 06/05/2020 10:07 PM    [facility] [place], [place], [place] [address] Phone: [phone]; Fax: [phone]

## 2020-06-05 NOTE — Telephone Encounter (Signed)
-----   Message from Satira Sark, MD sent at 06/04/2020  5:54 PM EST ----- Results reviewed.  Screening Myoview was significantly abnormal indicating previous large infarct scar and moderate residual ischemia, therefore presence of underlying CAD.  LVEF is normal.  Would schedule an office follow-up visit with APP this week to review further, discuss symptoms, and most likely proceed with a diagnostic cardiac catheterization.  He was originally referred with frequent PVCs.

## 2020-06-05 NOTE — Telephone Encounter (Signed)
Patient informed and verbalized understanding of plan. Copy sent to PCP 

## 2020-06-06 ENCOUNTER — Other Ambulatory Visit (HOSPITAL_COMMUNITY)
Admission: RE | Admit: 2020-06-06 | Discharge: 2020-06-06 | Disposition: A | Discharge status: Home / Self Care | Payer: Medicare Other | Source: Ambulatory Visit | Attending: Family Medicine | Admitting: Family Medicine

## 2020-06-06 ENCOUNTER — Encounter: Payer: Self-pay | Admitting: Family Medicine

## 2020-06-06 ENCOUNTER — Ambulatory Visit (INDEPENDENT_AMBULATORY_CARE_PROVIDER_SITE_OTHER): Payer: Medicare Other | Admitting: Family Medicine

## 2020-06-06 ENCOUNTER — Other Ambulatory Visit (HOSPITAL_COMMUNITY): Payer: Medicare Other

## 2020-06-06 ENCOUNTER — Other Ambulatory Visit: Payer: Self-pay

## 2020-06-06 ENCOUNTER — Telehealth: Payer: Self-pay | Admitting: Family Medicine

## 2020-06-06 VITALS — BP 120/60 | HR 66 | Ht 71.0 in | Wt 196.4 lb

## 2020-06-06 DIAGNOSIS — R9439 Abnormal result of other cardiovascular function study: Secondary | ICD-10-CM

## 2020-06-06 DIAGNOSIS — E782 Mixed hyperlipidemia: Secondary | ICD-10-CM

## 2020-06-06 DIAGNOSIS — Z20822 Contact with and (suspected) exposure to covid-19: Secondary | ICD-10-CM | POA: Insufficient documentation

## 2020-06-06 DIAGNOSIS — Z01810 Encounter for preprocedural cardiovascular examination: Secondary | ICD-10-CM

## 2020-06-06 DIAGNOSIS — I493 Ventricular premature depolarization: Secondary | ICD-10-CM | POA: Insufficient documentation

## 2020-06-06 DIAGNOSIS — I10 Essential (primary) hypertension: Secondary | ICD-10-CM

## 2020-06-06 DIAGNOSIS — Z01812 Encounter for preprocedural laboratory examination: Secondary | ICD-10-CM | POA: Insufficient documentation

## 2020-06-06 LAB — BASIC METABOLIC PANEL
Anion gap: 9 (ref 5–15)
BUN: 30 mg/dL — ABNORMAL HIGH (ref 8–23)
CO2: 27 mmol/L (ref 22–32)
Calcium: 9.9 mg/dL (ref 8.9–10.3)
Chloride: 104 mmol/L (ref 98–111)
Creatinine, Ser: 1.5 mg/dL — ABNORMAL HIGH (ref 0.61–1.24)
GFR, Estimated: 46 mL/min — ABNORMAL LOW (ref >60)
Glucose, Bld: 98 mg/dL (ref 70–99)
Potassium: 4.1 mmol/L (ref 3.5–5.1)
Sodium: 140 mmol/L (ref 135–145)

## 2020-06-06 LAB — CBC
HCT: 42.5 % (ref 39.0–52.0)
Hemoglobin: 13.7 g/dL (ref 13.0–17.0)
MCH: 32.2 pg (ref 26.0–34.0)
MCHC: 32.2 g/dL (ref 30.0–36.0)
MCV: 99.8 fL (ref 80.0–100.0)
Platelets: 169 10*3/uL (ref 150–400)
RBC: 4.26 MIL/uL (ref 4.22–5.81)
RDW: 14.5 % (ref 11.5–15.5)
WBC: 6.7 10*3/uL (ref 4.0–10.5)
nRBC: 0 % (ref 0.0–0.2)

## 2020-06-06 LAB — SARS CORONAVIRUS 2 (TAT 6-24 HRS): SARS Coronavirus 2: NEGATIVE

## 2020-06-06 NOTE — Telephone Encounter (Signed)
Pre-cert Verification for the following procedure    Left Heart Cath Friday, 06/09/2020 @10 :30 am with Dr. Claiborne Billings dx: abnormal stress test & chest pain

## 2020-06-06 NOTE — Patient Instructions (Addendum)
Medication Instructions:   Your physician recommends that you continue on your current medications as directed. Please refer to the Current Medication list given to you today.  Labwork:  Your physician recommends that you return for lab work in: TODAY for [place] Test at [facility].   Testing/Procedures: Your physician has requested that you have a cardiac catheterization. Cardiac catheterization is used to diagnose and/or treat various heart conditions. Doctors may recommend this procedure for a number of different reasons. The most common reason is to evaluate chest pain. Chest pain can be a symptom of coronary artery disease (CAD), and cardiac catheterization can show whether plaque is narrowing or blocking your heart's arteries. This procedure is also used to evaluate the valves, as well as measure the blood flow and oxygen levels in different parts of your heart. For further information please visit [email or web address]. Please follow instruction sheet, as given.  Follow-Up:  Your physician recommends that you schedule a follow-up appointment in: 1 month  Any Other Special Instructions Will Be Listed Below (If Applicable).  If you need a refill on your cardiac medications before your next appointment, please call your pharmacy.     [place] [phone] Dept: [phone] Loc: [phone]  Carl Young  06/06/2020  You are scheduled for a Cardiac Catheterization on Friday, December 10 with Dr. Shelva Majestic.  1. Please arrive at the [facility] (Main Entrance A) at [facility]: [address] [place], [place] [address] at 8:30 am (This time is two hours before your procedure to ensure your preparation). Free valet parking service is available.   Special note: Every effort is made to have your procedure done on time. Please  understand that emergencies sometimes delay scheduled procedures.  2. Diet: Do not eat solid foods after midnight.  The patient may have clear liquids until 5am upon the day of the procedure.  3. Labs: You will need to have blood drawn on Tuesday, December 7 at [facility]. You do not need to be fasting. Please have your covid test @1 :30 pm today 06/06/2020 and quarantine afterwards until your cath is completed.  4. Medication instructions in preparation for your procedure: Please hold your benazepril and chlorthalidone the day before and day of your cath.    Contrast Allergy: No   On the morning of your procedure, take your Aspirin 81 mg and any morning medicines NOT listed above.  You may use sips of water.  5. Plan for one night stay--bring personal belongings. 6. Bring a current list of your medications and current insurance cards. 7. You MUST have a responsible person to drive you home. 8. Someone MUST be with you the first 24 hours after you arrive home or your discharge will be delayed. 9. Please wear clothes that are easy to get on and off and wear slip-on shoes.  Thank you for allowing [place] to care for you!   -- [facility]

## 2020-06-07 ENCOUNTER — Telehealth: Payer: Self-pay | Admitting: *Deleted

## 2020-06-07 NOTE — Addendum Note (Signed)
Addended by: Luiz Blare on: 06/07/2020 10:41 AM   Modules accepted: Orders, SmartSet

## 2020-06-07 NOTE — Telephone Encounter (Signed)
Pt contacted pre-catheterization scheduled at [facility] for: Friday June 09, 2020 10:30 AM Verified arrival time and place: [facility]) at: 8:30 AM   No solid food after midnight prior to cath, clear liquids until 5 AM day of procedure.  Hold: Chlorthalidone-day before and day of procedure-GFR 2 Benazepril-day before and day of procedure-GFR 46  Except hold medications AM meds can be  taken pre-cath with sips of water including: ASA 81 mg   Confirmed patient has responsible adult to drive home post procedure and be with patient first 24 hours after arriving home: yes  You are allowed ONE visitor in the waiting room during the time you are at the hospital for your procedure. Both you and your visitor must wear a mask once you enter the hospital.       COVID-19 Pre-Screening Questions:  . In the past 14 days have you had any symptoms concerning for COVID-19 infection (fever, chills, cough, or new shortness of breath)? no . In the past 14 days have you been around anyone with known Covid 19? No   Reviewed procedure/mask/visitor instructions, COVID-19 questions with patient.

## 2020-06-09 ENCOUNTER — Inpatient Hospital Stay (HOSPITAL_COMMUNITY)
Admission: RE | Admit: 2020-06-09 | Discharge: 2020-06-18 | DRG: 234 | Disposition: A | Discharge status: Home / Self Care | Payer: Medicare Other | Attending: Surgery | Admitting: Surgery

## 2020-06-09 ENCOUNTER — Encounter (HOSPITAL_COMMUNITY)
Admission: RE | Disposition: A | Discharge status: Home / Self Care | Payer: Self-pay | Source: Home / Self Care | Attending: Surgery

## 2020-06-09 ENCOUNTER — Other Ambulatory Visit: Payer: Self-pay

## 2020-06-09 DIAGNOSIS — I2510 Atherosclerotic heart disease of native coronary artery without angina pectoris: Secondary | ICD-10-CM | POA: Diagnosis not present

## 2020-06-09 DIAGNOSIS — Z01818 Encounter for other preprocedural examination: Secondary | ICD-10-CM | POA: Diagnosis not present

## 2020-06-09 DIAGNOSIS — D696 Thrombocytopenia, unspecified: Secondary | ICD-10-CM | POA: Diagnosis not present

## 2020-06-09 DIAGNOSIS — D62 Acute posthemorrhagic anemia: Secondary | ICD-10-CM | POA: Diagnosis not present

## 2020-06-09 DIAGNOSIS — Z01811 Encounter for preprocedural respiratory examination: Secondary | ICD-10-CM

## 2020-06-09 DIAGNOSIS — I480 Paroxysmal atrial fibrillation: Secondary | ICD-10-CM

## 2020-06-09 DIAGNOSIS — Z7982 Long term (current) use of aspirin: Secondary | ICD-10-CM

## 2020-06-09 DIAGNOSIS — J939 Pneumothorax, unspecified: Secondary | ICD-10-CM | POA: Diagnosis not present

## 2020-06-09 DIAGNOSIS — Z951 Presence of aortocoronary bypass graft: Secondary | ICD-10-CM

## 2020-06-09 DIAGNOSIS — I252 Old myocardial infarction: Secondary | ICD-10-CM | POA: Diagnosis not present

## 2020-06-09 DIAGNOSIS — Z836 Family history of other diseases of the respiratory system: Secondary | ICD-10-CM | POA: Diagnosis not present

## 2020-06-09 DIAGNOSIS — I4891 Unspecified atrial fibrillation: Secondary | ICD-10-CM

## 2020-06-09 DIAGNOSIS — Z87891 Personal history of nicotine dependence: Secondary | ICD-10-CM | POA: Diagnosis not present

## 2020-06-09 DIAGNOSIS — J90 Pleural effusion, not elsewhere classified: Secondary | ICD-10-CM | POA: Diagnosis not present

## 2020-06-09 DIAGNOSIS — I493 Ventricular premature depolarization: Secondary | ICD-10-CM | POA: Diagnosis not present

## 2020-06-09 DIAGNOSIS — N183 Chronic kidney disease, stage 3 unspecified: Secondary | ICD-10-CM | POA: Diagnosis present

## 2020-06-09 DIAGNOSIS — Z20822 Contact with and (suspected) exposure to covid-19: Secondary | ICD-10-CM | POA: Diagnosis not present

## 2020-06-09 DIAGNOSIS — I129 Hypertensive chronic kidney disease with stage 1 through stage 4 chronic kidney disease, or unspecified chronic kidney disease: Secondary | ICD-10-CM | POA: Diagnosis not present

## 2020-06-09 DIAGNOSIS — R9439 Abnormal result of other cardiovascular function study: Secondary | ICD-10-CM | POA: Diagnosis not present

## 2020-06-09 DIAGNOSIS — I214 Non-ST elevation (NSTEMI) myocardial infarction: Secondary | ICD-10-CM | POA: Diagnosis not present

## 2020-06-09 DIAGNOSIS — I340 Nonrheumatic mitral (valve) insufficiency: Secondary | ICD-10-CM | POA: Diagnosis not present

## 2020-06-09 DIAGNOSIS — E782 Mixed hyperlipidemia: Secondary | ICD-10-CM | POA: Diagnosis not present

## 2020-06-09 DIAGNOSIS — Z79899 Other long term (current) drug therapy: Secondary | ICD-10-CM

## 2020-06-09 DIAGNOSIS — Z823 Family history of stroke: Secondary | ICD-10-CM

## 2020-06-09 DIAGNOSIS — I350 Nonrheumatic aortic (valve) stenosis: Secondary | ICD-10-CM | POA: Diagnosis present

## 2020-06-09 DIAGNOSIS — Z803 Family history of malignant neoplasm of breast: Secondary | ICD-10-CM | POA: Diagnosis not present

## 2020-06-09 DIAGNOSIS — I25110 Atherosclerotic heart disease of native coronary artery with unstable angina pectoris: Secondary | ICD-10-CM | POA: Diagnosis not present

## 2020-06-09 DIAGNOSIS — Z808 Family history of malignant neoplasm of other organs or systems: Secondary | ICD-10-CM

## 2020-06-09 DIAGNOSIS — I25119 Atherosclerotic heart disease of native coronary artery with unspecified angina pectoris: Secondary | ICD-10-CM | POA: Diagnosis not present

## 2020-06-09 DIAGNOSIS — Z881 Allergy status to other antibiotic agents status: Secondary | ICD-10-CM | POA: Diagnosis not present

## 2020-06-09 DIAGNOSIS — Z01810 Encounter for preprocedural cardiovascular examination: Secondary | ICD-10-CM | POA: Diagnosis not present

## 2020-06-09 DIAGNOSIS — Z8249 Family history of ischemic heart disease and other diseases of the circulatory system: Secondary | ICD-10-CM | POA: Diagnosis not present

## 2020-06-09 DIAGNOSIS — J9811 Atelectasis: Secondary | ICD-10-CM | POA: Diagnosis not present

## 2020-06-09 DIAGNOSIS — I081 Rheumatic disorders of both mitral and tricuspid valves: Secondary | ICD-10-CM | POA: Diagnosis not present

## 2020-06-09 DIAGNOSIS — R079 Chest pain, unspecified: Secondary | ICD-10-CM | POA: Diagnosis not present

## 2020-06-09 HISTORY — PX: LEFT HEART CATH AND CORONARY ANGIOGRAPHY: CATH118249

## 2020-06-09 SURGERY — LEFT HEART CATH AND CORONARY ANGIOGRAPHY
Anesthesia: LOCAL

## 2020-06-09 MED ORDER — SODIUM CHLORIDE 0.9% FLUSH: 3.0000 mL | INTRAVENOUS | Status: DC | PRN

## 2020-06-09 MED ORDER — LIDOCAINE HCL (PF) 1 % IJ SOLN
INTRAMUSCULAR | Status: DC | PRN
  Administered 2020-06-09: 2 mL

## 2020-06-09 MED ORDER — MIDAZOLAM HCL 2 MG/2ML IJ SOLN
INTRAMUSCULAR | Status: AC
  Filled 2020-06-09: qty 2

## 2020-06-09 MED ORDER — HEPARIN SODIUM (PORCINE) 1000 UNIT/ML IJ SOLN
INTRAMUSCULAR | Status: AC
  Filled 2020-06-09: qty 1

## 2020-06-09 MED ORDER — HEPARIN (PORCINE) IN NACL 1000-0.9 UT/500ML-% IV SOLN
INTRAVENOUS | Status: AC
  Filled 2020-06-09: qty 1000

## 2020-06-09 MED ORDER — VERAPAMIL HCL 2.5 MG/ML IV SOLN
INTRAVENOUS | Status: DC | PRN
  Administered 2020-06-09: 10 mL via INTRA_ARTERIAL

## 2020-06-09 MED ORDER — HYDRALAZINE HCL 20 MG/ML IJ SOLN: 10.0000 mg | INTRAMUSCULAR | Status: AC | PRN

## 2020-06-09 MED ORDER — ASPIRIN 81 MG PO CHEW
81.0000 mg | CHEWABLE_TABLET | Freq: Every day | ORAL | Status: DC
  Administered 2020-06-10 – 2020-06-11 (×2): 81 mg via ORAL
  Filled 2020-06-09 (×2): qty 1

## 2020-06-09 MED ORDER — SODIUM CHLORIDE 0.9 % IV SOLN: 250.0000 mL | INTRAVENOUS | Status: DC | PRN

## 2020-06-09 MED ORDER — ROSUVASTATIN CALCIUM 20 MG PO TABS
40.0000 mg | ORAL_TABLET | Freq: Every day | ORAL | Status: DC
  Administered 2020-06-10 – 2020-06-18 (×8): 40 mg via ORAL
  Filled 2020-06-09 (×8): qty 2

## 2020-06-09 MED ORDER — HEPARIN SODIUM (PORCINE) 1000 UNIT/ML IJ SOLN
INTRAMUSCULAR | Status: DC | PRN
  Administered 2020-06-09: 4400 [IU] via INTRAVENOUS

## 2020-06-09 MED ORDER — VERAPAMIL HCL 2.5 MG/ML IV SOLN
INTRAVENOUS | Status: AC
  Filled 2020-06-09: qty 2

## 2020-06-09 MED ORDER — LABETALOL HCL 5 MG/ML IV SOLN: 10.0000 mg | INTRAVENOUS | Status: AC | PRN

## 2020-06-09 MED ORDER — FENTANYL CITRATE (PF) 100 MCG/2ML IJ SOLN
INTRAMUSCULAR | Status: AC
  Filled 2020-06-09: qty 2

## 2020-06-09 MED ORDER — SODIUM CHLORIDE 0.9 % WEIGHT BASED INFUSION: 1.0000 mL/kg/h | INTRAVENOUS | Status: DC

## 2020-06-09 MED ORDER — SODIUM CHLORIDE 0.9% FLUSH
3.0000 mL | Freq: Two times a day (BID) | INTRAVENOUS | Status: DC
  Administered 2020-06-10 – 2020-06-11 (×4): 3 mL via INTRAVENOUS

## 2020-06-09 MED ORDER — HEPARIN (PORCINE) IN NACL 1000-0.9 UT/500ML-% IV SOLN
INTRAVENOUS | Status: DC | PRN
  Administered 2020-06-09 (×2): 500 mL

## 2020-06-09 MED ORDER — FENTANYL CITRATE (PF) 100 MCG/2ML IJ SOLN
INTRAMUSCULAR | Status: DC | PRN
  Administered 2020-06-09: 25 ug via INTRAVENOUS

## 2020-06-09 MED ORDER — LIDOCAINE HCL (PF) 1 % IJ SOLN
INTRAMUSCULAR | Status: AC
  Filled 2020-06-09: qty 30

## 2020-06-09 MED ORDER — MIDAZOLAM HCL 2 MG/2ML IJ SOLN
INTRAMUSCULAR | Status: DC | PRN
  Administered 2020-06-09: 1 mg via INTRAVENOUS

## 2020-06-09 MED ORDER — ONDANSETRON HCL 4 MG/2ML IJ SOLN: 4.0000 mg | Freq: Four times a day (QID) | INTRAMUSCULAR | Status: DC | PRN

## 2020-06-09 MED ORDER — HEPARIN (PORCINE) 25000 UT/250ML-% IV SOLN
1200.0000 [IU]/h | INTRAVENOUS | Status: DC
  Administered 2020-06-09: 1050 [IU]/h via INTRAVENOUS
  Administered 2020-06-10 – 2020-06-11 (×2): 1200 [IU]/h via INTRAVENOUS
  Filled 2020-06-09 (×3): qty 250

## 2020-06-09 MED ORDER — AMLODIPINE BESYLATE 10 MG PO TABS
10.0000 mg | ORAL_TABLET | Freq: Every day | ORAL | Status: DC
  Administered 2020-06-10 – 2020-06-11 (×2): 10 mg via ORAL
  Filled 2020-06-09 (×2): qty 1

## 2020-06-09 MED ORDER — IOHEXOL 350 MG/ML SOLN
INTRAVENOUS | Status: DC | PRN
  Administered 2020-06-09: 65 mL

## 2020-06-09 MED ORDER — ACETAMINOPHEN 325 MG PO TABS: 650.0000 mg | ORAL_TABLET | ORAL | Status: DC | PRN

## 2020-06-09 MED ORDER — ASPIRIN 81 MG PO CHEW: 81.0000 mg | CHEWABLE_TABLET | ORAL | Status: DC

## 2020-06-09 MED ORDER — METOPROLOL SUCCINATE ER 25 MG PO TB24
25.0000 mg | ORAL_TABLET | Freq: Every day | ORAL | Status: DC
  Administered 2020-06-10 – 2020-06-11 (×2): 25 mg via ORAL
  Filled 2020-06-09 (×2): qty 1

## 2020-06-09 MED ORDER — ASPIRIN EC 81 MG PO TBEC: 81.0000 mg | DELAYED_RELEASE_TABLET | Freq: Every day | ORAL | Status: DC

## 2020-06-09 MED ORDER — SODIUM CHLORIDE 0.9 % WEIGHT BASED INFUSION
3.0000 mL/kg/h | INTRAVENOUS | Status: AC
  Administered 2020-06-09: 3 mL/kg/h via INTRAVENOUS

## 2020-06-09 MED ORDER — SODIUM CHLORIDE 0.9 % IV SOLN: INTRAVENOUS | Status: DC

## 2020-06-09 MED ORDER — ISOSORBIDE MONONITRATE ER 30 MG PO TB24
30.0000 mg | ORAL_TABLET | Freq: Every day | ORAL | Status: DC
  Administered 2020-06-09 – 2020-06-11 (×3): 30 mg via ORAL
  Filled 2020-06-09 (×3): qty 1

## 2020-06-09 SURGICAL SUPPLY — 10 items
CATH INFINITI JR4 5F (CATHETERS) ×2 IMPLANT
CATH OPTITORQUE TIG 4.0 5F (CATHETERS) ×2 IMPLANT
DEVICE RAD COMP TR BAND LRG (VASCULAR PRODUCTS) ×2 IMPLANT
GLIDESHEATH SLEND SS 6F .021 (SHEATH) ×2 IMPLANT
GUIDEWIRE INQWIRE 1.5J.035X260 (WIRE) ×1 IMPLANT
INQWIRE 1.5J .035X260CM (WIRE) ×2
KIT HEART LEFT (KITS) ×2 IMPLANT
PACK CARDIAC CATHETERIZATION (CUSTOM PROCEDURE TRAY) ×2 IMPLANT
TRANSDUCER W/STOPCOCK (MISCELLANEOUS) ×2 IMPLANT
TUBING CIL FLEX 10 FLL-RA (TUBING) ×2 IMPLANT

## 2020-06-09 NOTE — Progress Notes (Signed)
ANTICOAGULATION CONSULT NOTE - Initial Consult  Pharmacy Consult for heparin Indication: chest pain/ACS  Allergies  Allergen Reactions  . Ciprofloxacin In D5w Rash and Other (See Comments)    Pruritis     Patient Measurements: Height: 6' (182.9 cm) Weight: 88 kg (194 lb) IBW/kg (Calculated) : 77.6 Heparin Dosing Weight: 88 kg   Vital Signs: Temp: 98.6 F (37 C) (12/10 1408) Temp Source: Oral (12/10 1408) BP: 146/76 (12/10 1408) Pulse Rate: 71 (12/10 1408)  Labs: No results for input(s): HGB, HCT, PLT, APTT, LABPROT, INR, HEPARINUNFRC, HEPRLOWMOCWT, CREATININE, CKTOTAL, CKMB, TROPONINIHS in the last 72 hours.  Estimated Creatinine Clearance: 42.4 mL/min (A) (by C-G formula based on SCr of 1.5 mg/dL (H)).   Medical History: Past Medical History:  Diagnosis Date  . Bell's palsy   . CKD (chronic kidney disease) stage 3, GFR 30-59 ml/min (HCC)   . Essential hypertension   . Helicobacter pylori (H. pylori) infection   . History of GI bleed   . Mixed hyperlipidemia   . Nephrolithiasis     Medications:  Scheduled:  . amLODipine  10 mg Oral Daily  . aspirin  81 mg Oral Daily  . aspirin EC  81 mg Oral QHS  . isosorbide mononitrate  30 mg Oral Daily  . metoprolol succinate  25 mg Oral Daily  . rosuvastatin  40 mg Oral Daily  . sodium chloride flush  3 mL Intravenous Q12H    Assessment: 34 yom who underwent cardiac cath finding severe multivessel CAD - no AC PTA.   Plan to start heparin 8 hours after sheath removal (documented on 12/10@1345 ). Awaiting CTVS evaluation.   Goal of Therapy:  Heparin level 0.3-0.7 units/ml Monitor platelets by anticoagulation protocol: Yes   Plan:  Start heparin infusion at 1050 units/hr on 12/10@2200  Order heparin level 8 hours after start Monitor daily HL, CBC, and for s/sx of bleeding  Antonietta Jewel, PharmD, [place] Pharmacist  Phone: [phone] 06/09/2020 4:22 PM  Please check AMION for all [place] phone  numbers After 10:00 PM, call [place] [phone]

## 2020-06-09 NOTE — Progress Notes (Signed)
TCTS consulted for CABG evaluation. °

## 2020-06-09 NOTE — Interval H&P Note (Signed)
Cath Lab Visit (complete for each Cath Lab visit)  Clinical Evaluation Leading to the Procedure:   ACS: No.  Non-ACS:    Anginal Classification: CCS II  Anti-ischemic medical therapy: Maximal Therapy (2 or more classes of medications)  Non-Invasive Test Results: High-risk stress test findings: cardiac mortality >3%/year  Prior CABG: No previous CABG      History and Physical Interval Note:  06/09/2020 12:50 PM  Carl Young  has presented today for surgery, with the diagnosis of Abnormal stress test.  The various methods of treatment have been discussed with the patient and family. After consideration of risks, benefits and other options for treatment, the patient has consented to  Procedure(s): LEFT HEART CATH AND CORONARY ANGIOGRAPHY (N/A) as a surgical intervention.  The patient's history has been reviewed, patient examined, no change in status, stable for surgery.  I have reviewed the patient's chart and labs.  Questions were answered to the patient's satisfaction.     Shelva Majestic

## 2020-06-10 ENCOUNTER — Encounter (HOSPITAL_COMMUNITY): Payer: Self-pay | Admitting: Cardiovascular Disease

## 2020-06-10 ENCOUNTER — Inpatient Hospital Stay (HOSPITAL_COMMUNITY): Payer: Medicare Other

## 2020-06-10 ENCOUNTER — Other Ambulatory Visit (HOSPITAL_COMMUNITY): Payer: Medicare Other

## 2020-06-10 DIAGNOSIS — I340 Nonrheumatic mitral (valve) insufficiency: Secondary | ICD-10-CM

## 2020-06-10 DIAGNOSIS — I350 Nonrheumatic aortic (valve) stenosis: Secondary | ICD-10-CM

## 2020-06-10 DIAGNOSIS — I25110 Atherosclerotic heart disease of native coronary artery with unstable angina pectoris: Secondary | ICD-10-CM

## 2020-06-10 DIAGNOSIS — Z01810 Encounter for preprocedural cardiovascular examination: Secondary | ICD-10-CM

## 2020-06-10 DIAGNOSIS — I214 Non-ST elevation (NSTEMI) myocardial infarction: Secondary | ICD-10-CM

## 2020-06-10 LAB — ECHOCARDIOGRAM COMPLETE
AR max vel: 1.53 cm2
AV Area VTI: 1.44 cm2
AV Area mean vel: 1.45 cm2
AV Mean grad: 11 mmHg
AV Peak grad: 19.8 mmHg
Ao pk vel: 2.23 m/s
Area-P 1/2: 2.83 cm2
Height: 72 in
S' Lateral: 3.5 cm
Weight: 3097.6 oz

## 2020-06-10 LAB — CBC
HCT: 35.4 % — ABNORMAL LOW (ref 39.0–52.0)
Hemoglobin: 11.8 g/dL — ABNORMAL LOW (ref 13.0–17.0)
MCH: 31.8 pg (ref 26.0–34.0)
MCHC: 33.3 g/dL (ref 30.0–36.0)
MCV: 95.4 fL (ref 80.0–100.0)
Platelets: 138 10*3/uL — ABNORMAL LOW (ref 150–400)
RBC: 3.71 MIL/uL — ABNORMAL LOW (ref 4.22–5.81)
RDW: 14.3 % (ref 11.5–15.5)
WBC: 5.8 10*3/uL (ref 4.0–10.5)
nRBC: 0 % (ref 0.0–0.2)

## 2020-06-10 LAB — BASIC METABOLIC PANEL
Anion gap: 8 (ref 5–15)
BUN: 21 mg/dL (ref 8–23)
CO2: 25 mmol/L (ref 22–32)
Calcium: 9 mg/dL (ref 8.9–10.3)
Chloride: 106 mmol/L (ref 98–111)
Creatinine, Ser: 1.38 mg/dL — ABNORMAL HIGH (ref 0.61–1.24)
GFR, Estimated: 51 mL/min — ABNORMAL LOW (ref >60)
Glucose, Bld: 88 mg/dL (ref 70–99)
Potassium: 3.7 mmol/L (ref 3.5–5.1)
Sodium: 139 mmol/L (ref 135–145)

## 2020-06-10 LAB — HEPARIN LEVEL (UNFRACTIONATED)
Heparin Unfractionated: 0.23 IU/mL — ABNORMAL LOW (ref 0.30–0.70)
Heparin Unfractionated: 0.34 IU/mL (ref 0.30–0.70)

## 2020-06-10 MED ORDER — PROSIGHT PO TABS
1.0000 | ORAL_TABLET | Freq: Every day | ORAL | Status: DC
  Administered 2020-06-10 – 2020-06-11 (×2): 1 via ORAL
  Filled 2020-06-10 (×2): qty 1

## 2020-06-10 MED ORDER — OCUVITE-LUTEIN PO CAPS
1.0000 | ORAL_CAPSULE | Freq: Every day | ORAL | Status: DC
  Filled 2020-06-10 (×2): qty 1

## 2020-06-10 NOTE — Plan of Care (Signed)

## 2020-06-10 NOTE — Progress Notes (Signed)
Subjective:  Denies SSCP, palpitations or Dyspnea CVTS has not seen yet Where called by Birdie Sons yesterday  Objective:  Vitals:   06/09/20 1657 06/09/20 1742 06/09/20 2010 06/10/20 0522  BP: (!) 162/87 (!) 150/72 119/67 (!) 97/59  Pulse: 73 68 72 60  Resp:   17 18  Temp:   98.9 F (37.2 C) 97.6 F (36.4 C)  TempSrc:   Oral Oral  SpO2: 94% 95% 93% 94%  Weight:    87.8 kg  Height:        Intake/Output from previous day:  Intake/Output Summary (Last 24 hours) at 06/10/2020 0815 Last data filed at 06/10/2020 6301 Gross per 24 hour  Intake 1370.2 ml  Output 1600 ml  Net -229.8 ml    Physical Exam: Affect appropriate Healthy:  appears stated age HEENT: normal Neck supple with no adenopathy JVP normal no bruits no thyromegaly Lungs clear with no wheezing and good diaphragmatic motion Heart:  S1/S2 AS  murmur, no rub, gallop or click PMI normal Abdomen: benighn, BS positve, no tenderness, no AAA no bruit.  No HSM or HJR Distal pulses intact with no bruits No edema Neuro non-focal Skin warm and dry No muscular weakness   Lab Results: Basic Metabolic Panel: Recent Labs    06/10/20 0621  NA 139  K 3.7  CL 106  CO2 25  GLUCOSE 88  BUN 21  CREATININE 1.38*  CALCIUM 9.0   Liver Function Tests: No results for input(s): AST, ALT, ALKPHOS, BILITOT, PROT, ALBUMIN in the last 72 hours. No results for input(s): LIPASE, AMYLASE in the last 72 hours. CBC: Recent Labs    06/10/20 0621  WBC 5.8  HGB 11.8*  HCT 35.4*  MCV 95.4  PLT 138*    Imaging: CARDIAC CATHETERIZATION  Result Date: 06/09/2020  Prox RCA-1 lesion is 80% stenosed.  Prox RCA-2 lesion is 80% stenosed.  Mid RCA lesion is 95% stenosed.  Ost LAD to Prox LAD lesion is 95% stenosed.  Mid LAD lesion is 70% stenosed.  Ramus lesion is 85% stenosed.  LPAV-1 lesion is 90% stenosed.  LPAV-2 lesion is 100% stenosed.  Lat 2nd Mrg lesion is 80% stenosed.  2nd Mrg-1 lesion is 50% stenosed.   2nd Mrg-2 lesion is 90% stenosed.  Mid Cx lesion is 70% stenosed.  LV end diastolic pressure is normal.  The left ventricular systolic function is normal.  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 Mild LV dysfunction with EF estimated  At 40 - 45%. There is mild inferoapical and focal mid anterolateral hypocontractility. LVEDP 12 mmHg. RECOMMENDATION: The patient will be admitted due to high-grade multivessel stenoses. Surgical consultation for CABG revascularization. With very high-grade ostial ID disease, will initiate heparin later this evening.    Cardiac Studies:  ECG:  Orders placed or performed during the hospital encounter of 06/09/20  . EKG 12-Lead  . EKG 12-Lead     Telemetry:  NSR occasional PVC;s 06/10/2020   Echo: pending October 2021 [place] mild AS mean gradient 12 mmHg   Medications:   . amLODipine  10 mg Oral Daily  . aspirin  81  mg Oral Daily  . isosorbide mononitrate  30 mg Oral Daily  . metoprolol succinate  25 mg Oral Daily  . rosuvastatin  40 mg Oral Daily  . sodium chloride flush  3 mL Intravenous Q12H     . sodium chloride Stopped (06/10/20 0400)  . sodium chloride    . heparin 1,050 Units/hr (06/09/20 2223)    Assessment/Plan:   1. CAD:  Cath with severe 3 VD EF 45-60% for CABG next week once seen by CVTS will order pre cabg dopplers continue heparin  2. AS repeat echo pending but with low mean gradient and TAVR availability suspect he will just need CABG not AVR 3. HLD:  Continue statin   Jenkins Rouge 06/10/2020, 8:15 AM

## 2020-06-10 NOTE — Progress Notes (Signed)
ANTICOAGULATION CONSULT NOTE - Initial Consult  Pharmacy Consult for heparin Indication: chest pain/ACS  Allergies  Allergen Reactions  . Ciprofloxacin In D5w Rash and Other (See Comments)    Pruritis     Patient Measurements: Height: 6' (182.9 cm) Weight: 87.8 kg (193 lb 9.6 oz) IBW/kg (Calculated) : 77.6 Heparin Dosing Weight: 88 kg   Vital Signs: Temp: 97.6 F (36.4 C) (12/11 0522) Temp Source: Oral (12/11 0522) BP: 97/59 (12/11 0522) Pulse Rate: 60 (12/11 0522)  Labs: Recent Labs    06/10/20 0621 06/10/20 0625  HGB 11.8*  --   HCT 35.4*  --   PLT 138*  --   HEPARINUNFRC  --  0.23*    Estimated Creatinine Clearance: 42.4 mL/min (A) (by C-G formula based on SCr of 1.5 mg/dL (H)).   Medical History: Past Medical History:  Diagnosis Date  . Bell's palsy   . CKD (chronic kidney disease) stage 3, GFR 30-59 ml/min (HCC)   . Essential hypertension   . Helicobacter pylori (H. pylori) infection   . History of GI bleed   . Mixed hyperlipidemia   . Nephrolithiasis     Medications:  Scheduled:  . amLODipine  10 mg Oral Daily  . aspirin  81 mg Oral Daily  . isosorbide mononitrate  30 mg Oral Daily  . metoprolol succinate  25 mg Oral Daily  . rosuvastatin  40 mg Oral Daily  . 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 PTA the pt is not on anticoagulation. The pt started heparin 8 hours after sheath removal (documented on 12/10 @ 1345). Currently awaiting consult for CABG evaluation.   Heparin level this morning is subtherapeutic at 0.23 while running at 1050 units/hr. Per the RN, there were no issues with the infusion and the pt does not have any signs or symptoms of bleeding. Monitoring CBC, this morning Hgb 11.8 and platelets 138.   Goal of Therapy:  Heparin level 0.3-0.7 units/ml Monitor platelets by anticoagulation protocol: Yes   Plan:  Increase heparin IV to 1200  units/hr Obtain 8 hour heparin level Monitor daily heparin level, CBC Monitor for signs and symptoms of bleeding  Shauna Hugh, PharmD, [place]  PGY-1 Pharmacy Resident 06/10/2020 7:51 AM  Please check [email or web address] for unit-specific pharmacy phone numbers.

## 2020-06-10 NOTE — Progress Notes (Signed)
VASCULAR LAB    Bilateral lower extremity venous duplex has been performed.  See CV proc for preliminary results.   Tennie Grussing, RVT 06/10/2020, 12:11 PM

## 2020-06-10 NOTE — Progress Notes (Signed)
  Echocardiogram 2D Echocardiogram has been performed.  Carl Young 06/10/2020, 11:58 AM

## 2020-06-10 NOTE — Progress Notes (Signed)
CARDIAC REHAB PHASE I    Gave pt and wife Pre-op booklet and discussed sternal precautions after surgery. Wife will be home to take care of pt after surgery. Discussed IS use before and after surgery. Also discussed importance of walking before and after surgery. Pt and wife were receptive. CABG planned for Monday. Will continue to follow after surgery.  [id number]  Rick Duff, [place], CEP 06/10/2020

## 2020-06-10 NOTE — Progress Notes (Signed)
ANTICOAGULATION CONSULT NOTE - Follow Up Consult  Pharmacy Consult for heparin Indication: chest pain/ACS  Allergies  Allergen Reactions  . Ciprofloxacin In D5w Rash and Other (See Comments)    Pruritis     Patient Measurements: Height: 6' (182.9 cm) Weight: 87.8 kg (193 lb 9.6 oz) IBW/kg (Calculated) : 77.6 Heparin Dosing Weight: 88 kg   Vital Signs: Temp: 97.6 F (36.4 C) (12/11 0522) Temp Source: Oral (12/11 0522) BP: 133/70 (12/11 0914) Pulse Rate: 69 (12/11 0914)  Labs: Recent Labs    06/10/20 0621 06/10/20 0625 06/10/20 1549  HGB 11.8*  --   --   HCT 35.4*  --   --   PLT 138*  --   --   HEPARINUNFRC  --  0.23* 0.34  CREATININE 1.38*  --   --     Estimated Creatinine Clearance: 46.1 mL/min (A) (by C-G formula based on SCr of 1.38 mg/dL (H)).   Medical History: Past Medical History:  Diagnosis Date  . Bell's palsy   . CKD (chronic kidney disease) stage 3, GFR 30-59 ml/min (HCC)   . Essential hypertension   . Helicobacter pylori (H. pylori) infection   . History of GI bleed   . Mixed hyperlipidemia   . Nephrolithiasis     Medications:  Scheduled:  . amLODipine  10 mg Oral Daily  . aspirin  81 mg Oral Daily  . isosorbide mononitrate  30 mg Oral Daily  . metoprolol succinate  25 mg Oral Daily  . multivitamin  1 tablet Oral Daily  . rosuvastatin  40 mg Oral Daily  . 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 PTA the pt is not on anticoagulation. The pt started heparin 8 hours after sheath removal (documented on 12/10 @ 1345). Currently awaiting consult for CABG evaluation.   Heparin level this afternoon is therapeutic at 0.34 on 1200 units/hr. No s/s of bleeding reported by RN.   Goal of Therapy:  Heparin level 0.3-0.7 units/ml Monitor platelets by anticoagulation protocol: Yes   Plan:  Continue heparin IV at 1200 units/hr Monitor daily heparin level,  CBC Monitor for signs and symptoms of bleeding  Albertina Parr, PharmD., BCPS, BCCCP Clinical Pharmacist Please refer to [facility] for unit-specific pharmacist

## 2020-06-10 NOTE — Consult Note (Signed)
TCTS Preliminary Consult Note  [name] and examined; films reviewed. Full consult note to follow. Tentatively plan CABG Monday with Dr. Cyndia Bent. Carl Young Carl Young, [name]

## 2020-06-11 ENCOUNTER — Inpatient Hospital Stay (HOSPITAL_COMMUNITY): Payer: Medicare Other

## 2020-06-11 DIAGNOSIS — I25110 Atherosclerotic heart disease of native coronary artery with unstable angina pectoris: Principal | ICD-10-CM

## 2020-06-11 DIAGNOSIS — I2510 Atherosclerotic heart disease of native coronary artery without angina pectoris: Secondary | ICD-10-CM

## 2020-06-11 LAB — BASIC METABOLIC PANEL
Anion gap: 11 (ref 5–15)
BUN: 26 mg/dL — ABNORMAL HIGH (ref 8–23)
CO2: 24 mmol/L (ref 22–32)
Calcium: 9.5 mg/dL (ref 8.9–10.3)
Chloride: 102 mmol/L (ref 98–111)
Creatinine, Ser: 1.47 mg/dL — ABNORMAL HIGH (ref 0.61–1.24)
GFR, Estimated: 48 mL/min — ABNORMAL LOW (ref >60)
Glucose, Bld: 108 mg/dL — ABNORMAL HIGH (ref 70–99)
Potassium: 3.9 mmol/L (ref 3.5–5.1)
Sodium: 137 mmol/L (ref 135–145)

## 2020-06-11 LAB — URINALYSIS, ROUTINE W REFLEX MICROSCOPIC
Bilirubin Urine: NEGATIVE
Glucose, UA: NEGATIVE mg/dL
Hgb urine dipstick: NEGATIVE
Ketones, ur: NEGATIVE mg/dL
Leukocytes,Ua: NEGATIVE
Nitrite: NEGATIVE
Protein, ur: NEGATIVE mg/dL
Specific Gravity, Urine: 1.011 (ref 1.005–1.030)
pH: 6 (ref 5.0–8.0)

## 2020-06-11 LAB — BLOOD GAS, ARTERIAL
Acid-base deficit: 0.5 mmol/L (ref 0.0–2.0)
Bicarbonate: 23.1 mmol/L (ref 20.0–28.0)
Drawn by: 519031
FIO2: 21
O2 Saturation: 95.4 %
Patient temperature: 37.1
pCO2 arterial: 34.7 mmHg (ref 32.0–48.0)
pH, Arterial: 7.439 (ref 7.350–7.450)
pO2, Arterial: 75.9 mmHg — ABNORMAL LOW (ref 83.0–108.0)

## 2020-06-11 LAB — SURGICAL PCR SCREEN
MRSA, PCR: NEGATIVE
Staphylococcus aureus: NEGATIVE

## 2020-06-11 LAB — HEMOGLOBIN A1C
Hgb A1c MFr Bld: 5.5 % (ref 4.8–5.6)
Mean Plasma Glucose: 111.15 mg/dL

## 2020-06-11 LAB — PROTIME-INR
INR: 1.1 (ref 0.8–1.2)
Prothrombin Time: 13.3 seconds (ref 11.4–15.2)

## 2020-06-11 LAB — CBC
HCT: 36.7 % — ABNORMAL LOW (ref 39.0–52.0)
Hemoglobin: 12.7 g/dL — ABNORMAL LOW (ref 13.0–17.0)
MCH: 32.5 pg (ref 26.0–34.0)
MCHC: 34.6 g/dL (ref 30.0–36.0)
MCV: 93.9 fL (ref 80.0–100.0)
Platelets: 151 10*3/uL (ref 150–400)
RBC: 3.91 MIL/uL — ABNORMAL LOW (ref 4.22–5.81)
RDW: 14.1 % (ref 11.5–15.5)
WBC: 7 10*3/uL (ref 4.0–10.5)
nRBC: 0 % (ref 0.0–0.2)

## 2020-06-11 LAB — ABO/RH: ABO/RH(D): B NEG

## 2020-06-11 LAB — HEPARIN LEVEL (UNFRACTIONATED): Heparin Unfractionated: 0.59 IU/mL (ref 0.30–0.70)

## 2020-06-11 LAB — TYPE AND SCREEN
ABO/RH(D): B NEG
Antibody Screen: NEGATIVE

## 2020-06-11 LAB — APTT: aPTT: 75 seconds — ABNORMAL HIGH (ref 24–36)

## 2020-06-11 MED ORDER — INSULIN REGULAR(HUMAN) IN NACL 100-0.9 UT/100ML-% IV SOLN
INTRAVENOUS | Status: AC
  Administered 2020-06-12: 08:00:00 1 [IU]/h via INTRAVENOUS
  Filled 2020-06-11: qty 100

## 2020-06-11 MED ORDER — TRANEXAMIC ACID (OHS) PUMP PRIME SOLUTION
2.0000 mg/kg | INTRAVENOUS | Status: DC
  Filled 2020-06-11: qty 1.74

## 2020-06-11 MED ORDER — SODIUM CHLORIDE 0.9 % IV SOLN
INTRAVENOUS | Status: DC
  Filled 2020-06-11: qty 30

## 2020-06-11 MED ORDER — MAGNESIUM SULFATE 50 % IJ SOLN
40.0000 meq | INTRAMUSCULAR | Status: DC
  Filled 2020-06-11: qty 9.85

## 2020-06-11 MED ORDER — NOREPINEPHRINE 4 MG/250ML-% IV SOLN
0.0000 ug/min | INTRAVENOUS | Status: DC
  Filled 2020-06-11: qty 250

## 2020-06-11 MED ORDER — CHLORHEXIDINE GLUCONATE CLOTH 2 % EX PADS
6.0000 | MEDICATED_PAD | Freq: Once | CUTANEOUS | Status: AC
  Administered 2020-06-12: 06:00:00 6 via TOPICAL

## 2020-06-11 MED ORDER — BISACODYL 5 MG PO TBEC: 5.0000 mg | DELAYED_RELEASE_TABLET | Freq: Once | ORAL | Status: DC

## 2020-06-11 MED ORDER — NITROGLYCERIN IN D5W 200-5 MCG/ML-% IV SOLN
2.0000 ug/min | INTRAVENOUS | Status: DC
  Filled 2020-06-11: qty 250

## 2020-06-11 MED ORDER — TRANEXAMIC ACID (OHS) BOLUS VIA INFUSION
15.0000 mg/kg | INTRAVENOUS | Status: DC
  Filled 2020-06-11: qty 1305

## 2020-06-11 MED ORDER — CHLORHEXIDINE GLUCONATE 0.12 % MT SOLN
15.0000 mL | Freq: Once | OROMUCOSAL | Status: AC
  Administered 2020-06-12: 06:00:00 15 mL via OROMUCOSAL
  Filled 2020-06-11: qty 15

## 2020-06-11 MED ORDER — TRANEXAMIC ACID 1000 MG/10ML IV SOLN
1.5000 mg/kg/h | INTRAVENOUS | Status: AC
  Administered 2020-06-12: 08:00:00 15 mg/kg/h via INTRAVENOUS
  Filled 2020-06-11: qty 25

## 2020-06-11 MED ORDER — EPINEPHRINE HCL 5 MG/250ML IV SOLN IN NS
0.0000 ug/min | INTRAVENOUS | Status: DC
  Filled 2020-06-11: qty 250

## 2020-06-11 MED ORDER — CHLORHEXIDINE GLUCONATE CLOTH 2 % EX PADS
6.0000 | MEDICATED_PAD | Freq: Once | CUTANEOUS | Status: AC
  Administered 2020-06-11: 21:00:00 6 via TOPICAL

## 2020-06-11 MED ORDER — SODIUM CHLORIDE 0.9 % IV SOLN
750.0000 mg | INTRAVENOUS | Status: DC
  Filled 2020-06-11: qty 750

## 2020-06-11 MED ORDER — TEMAZEPAM 15 MG PO CAPS
15.0000 mg | ORAL_CAPSULE | Freq: Once | ORAL | Status: AC | PRN
  Administered 2020-06-11: 21:00:00 15 mg via ORAL
  Filled 2020-06-11: qty 1

## 2020-06-11 MED ORDER — PLASMA-LYTE 148 IV SOLN
INTRAVENOUS | Status: DC
  Filled 2020-06-11: qty 2.5

## 2020-06-11 MED ORDER — SODIUM CHLORIDE 0.9 % IV SOLN
1.5000 g | INTRAVENOUS | Status: AC
  Administered 2020-06-12: 12:00:00 .75 g via INTRAVENOUS
  Administered 2020-06-12: 08:00:00 1.5 g via INTRAVENOUS
  Filled 2020-06-11: qty 1.5

## 2020-06-11 MED ORDER — POTASSIUM CHLORIDE 2 MEQ/ML IV SOLN
80.0000 meq | INTRAVENOUS | Status: DC
  Filled 2020-06-11: qty 40

## 2020-06-11 MED ORDER — PHENYLEPHRINE HCL-NACL 20-0.9 MG/250ML-% IV SOLN
30.0000 ug/min | INTRAVENOUS | Status: AC
  Administered 2020-06-12: 08:00:00 25 ug/min via INTRAVENOUS
  Filled 2020-06-11: qty 250

## 2020-06-11 MED ORDER — DEXMEDETOMIDINE HCL IN NACL 400 MCG/100ML IV SOLN
0.1000 ug/kg/h | INTRAVENOUS | Status: AC
  Administered 2020-06-12: 08:00:00 .3 ug/kg/h via INTRAVENOUS
  Filled 2020-06-11: qty 100

## 2020-06-11 MED ORDER — MILRINONE LACTATE IN DEXTROSE 20-5 MG/100ML-% IV SOLN
0.3000 ug/kg/min | INTRAVENOUS | Status: DC
  Filled 2020-06-11: qty 100

## 2020-06-11 MED ORDER — METOPROLOL TARTRATE 12.5 MG HALF TABLET
12.5000 mg | ORAL_TABLET | Freq: Once | ORAL | Status: AC
  Administered 2020-06-12: 06:00:00 12.5 mg via ORAL
  Filled 2020-06-11: qty 1

## 2020-06-11 MED ORDER — VANCOMYCIN HCL 1500 MG/300ML IV SOLN
1500.0000 mg | INTRAVENOUS | Status: DC
  Filled 2020-06-11: qty 300

## 2020-06-11 NOTE — Progress Notes (Signed)
RT NOTES: ABG obtained and sent to lab. Lab tech Amanda notified.  

## 2020-06-11 NOTE — Progress Notes (Signed)
ANTICOAGULATION CONSULT NOTE - Follow Up  Pharmacy Consult for heparin Indication: chest pain/ACS  Allergies  Allergen Reactions  . Ciprofloxacin In D5w Rash and Other (See Comments)    Pruritis     Patient Measurements: Height: 6' (182.9 cm) Weight: 87 kg (191 lb 11.2 oz) IBW/kg (Calculated) : 77.6 Heparin Dosing Weight: 88 kg   Vital Signs: Temp: 97.8 F (36.6 C) (12/12 0515) Temp Source: Oral (12/12 0515) BP: 138/75 (12/12 0515) Pulse Rate: 66 (12/12 0515)  Labs: Recent Labs    06/10/20 0621 06/10/20 0625 06/10/20 1549 06/11/20 0528  HGB 11.8*  --   --  12.7*  HCT 35.4*  --   --  36.7*  PLT 138*  --   --  151  HEPARINUNFRC  --  0.23* 0.34 0.59  CREATININE 1.38*  --   --  1.47*    Estimated Creatinine Clearance: 43.3 mL/min (A) (by C-G formula based on SCr of 1.47 mg/dL (H)).   Medical History: Past Medical History:  Diagnosis Date  . Bell's palsy   . CKD (chronic kidney disease) stage 3, GFR 30-59 ml/min (HCC)   . Essential hypertension   . Helicobacter pylori (H. pylori) infection   . History of GI bleed   . Mixed hyperlipidemia   . Nephrolithiasis     Medications:  Scheduled:  . amLODipine  10 mg Oral Daily  . aspirin  81 mg Oral Daily  . isosorbide mononitrate  30 mg Oral Daily  . metoprolol succinate  25 mg Oral Daily  . multivitamin  1 tablet Oral Daily  . rosuvastatin  40 mg Oral Daily  . 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 PTA the pt is not on anticoagulation. The pt started heparin 8 hours after sheath removal (documented on 12/10 @ 1345). The pt is awaiting CABG which is tentatively planned for 06/12/20. Pharmacy is consulted to dose heparin.   Heparin level this morning is therapeutic at 0.59 while running at 1200 units/hr. Per the RN, there were no issues with the infusion and the pt does not have any signs or symptoms of bleeding. Monitoring  CBC which is stable with Hgb 12.7 and platelets 151. The patient has had two therapeutic heparin levels in a row, therefore, will monitor with daily heparin levels.   Goal of Therapy:  Heparin level 0.3-0.7 units/ml Monitor platelets by anticoagulation protocol: Yes   Plan:  Continue heparin IV at 1200 units/hr Monitor daily heparin level and CBC Monitor for signs and symptoms of bleeding  Shauna Hugh, PharmD, [place]  PGY-1 Pharmacy Resident 06/11/2020 8:32 AM  Please check [email or web address] for unit-specific pharmacy phone numbers.

## 2020-06-11 NOTE — Consult Note (Signed)
[address]       [place] [address]             [phone]        Carl Young Medical Record #[id number] Date of Birth: Jan 18, 1941  Referring: No ref. provider found Primary Care: Glenda Chroman, MD Primary Cardiologist:Samuel Domenic Polite, MD  Chief Complaint:   No chief complaint on file. History of fatigue and shortness of breath   History of Present Illness:     79 year old man was in usual state of health of hypertension and hyperlipidemia until he had what appeared to be a near syncopal episode.  His wife insisted that he go to the emergency department in [place].  While there, he was diagnosed with bigeminy.  This led to a stress test which was positive.  In follow-up, the patient underwent left heart catheterization.  This demonstrated severe multivessel coronary artery disease.  He is admitted to the hospital and is awaiting CABG.  Since admission, he has been chest pain-free and has had no shortness of breath   Current Activity/ Functional Status: Patient will be independent with mobility/ambulation, transfers, ADL's, IADL's.   Zubrod Score: At the time of surgery this patient's most appropriate activity status/level should be described as: []     0    Normal activity, no symptoms []     1    Restricted in physical strenuous activity but ambulatory, able to do out light work []     2    Ambulatory and capable of self care, unable to do work activities, up and about                 more than 50%  Of the time                            []     3    Only limited self care, in bed greater than 50% of waking hours []     4    Completely disabled, no self care, confined to bed or chair []     5    Moribund  Past Medical History:  Diagnosis Date  . Bell's palsy   . CKD (chronic kidney disease) stage 3, GFR 30-59 ml/min (HCC)   . Essential hypertension   . Helicobacter pylori (H. pylori) infection   . History of GI bleed   . Mixed  hyperlipidemia   . Nephrolithiasis     Past Surgical History:  Procedure Laterality Date  . APPENDECTOMY    . COLONOSCOPY  05/06/2012   Procedure: COLONOSCOPY;  Surgeon: Rogene Houston, MD;  Location: [facility];  Service: Endoscopy;  Laterality: N/A;  730  . COLONOSCOPY N/A 11/03/2019   Procedure: COLONOSCOPY;  Surgeon: Rogene Houston, MD;  Location: [facility];  Service: Endoscopy;  Laterality: N/A;  930-rescheduled 5/5 same time per office  . HAMMER TOE SURGERY Left 07/09/2017   Procedure: HAMMER TOE CORRECTION 2ND DIGIT LEFT FOOT;  Surgeon: Caprice Beaver, DPM;  Location: [facility];  Service: Podiatry;  Laterality: Left;  . METATARSAL HEAD EXCISION Left 08/25/2019   Procedure: METATARSAL HEAD RESECTION OF THE 2ND AND 3RD METATARSAL LEFT FOOT;  Surgeon: Caprice Beaver, DPM;  Location: [facility];  Service: Podiatry;  Laterality: Left;  . POLYPECTOMY  11/03/2019   Procedure: POLYPECTOMY;  Surgeon: Rogene Houston, MD;  Location: [facility];  Service: Endoscopy;;  . Malka So  OSTEOTOMY Left 07/09/2017   Procedure: WEIL OSTEOTOMY 2ND METATARSAL LEFT FOOT;  Surgeon: Caprice Beaver, DPM;  Location: [facility];  Service: Podiatry;  Laterality: Left;    Social History   Tobacco Use  Smoking Status Former Smoker  . Types: Cigarettes, Cigars  . Quit date: 07/08/1985  . Years since quitting: 34.9  Smokeless Tobacco Never Used    Social History   Substance and Sexual Activity  Alcohol Use Yes   Comment: Occasional     Allergies  Allergen Reactions  . Ciprofloxacin In D5w Rash and Other (See Comments)    Pruritis     Current Facility-Administered Medications  Medication Dose Route Frequency Provider Last Rate Last Admin  . 0.9 %  sodium chloride infusion   Intravenous Continuous Troy Sine, MD   Stopped at 06/10/20 0400  . 0.9 %  sodium chloride infusion  250 mL Intravenous PRN Troy Sine, MD      . acetaminophen (TYLENOL) tablet 650 mg  650 mg Oral Q4H PRN Troy Sine, MD      . amLODipine (NORVASC) tablet 10 mg  10 mg Oral Daily Troy Sine, MD   10 mg at 06/11/20 0943  . aspirin chewable tablet 81 mg  81 mg Oral Daily Troy Sine, MD   81 mg at 06/11/20 0944  . heparin ADULT infusion 100 units/mL (25000 units/225mL sodium chloride 0.45%)  1,200 Units/hr Intravenous Continuous Darlina Sicilian, RPH 12 mL/hr at 06/10/20 2004 1,200 Units/hr at 06/10/20 2004  . isosorbide mononitrate (IMDUR) 24 hr tablet 30 mg  30 mg Oral Daily Troy Sine, MD   30 mg at 06/11/20 0944  . metoprolol succinate (TOPROL-XL) 24 hr tablet 25 mg  25 mg Oral Daily Troy Sine, MD   25 mg at 06/11/20 0943  . multivitamin (PROSIGHT) tablet 1 tablet  1 tablet Oral Daily Troy Sine, MD   1 tablet at 06/11/20 [phone]  . ondansetron (ZOFRAN) injection 4 mg  4 mg Intravenous Q6H PRN Troy Sine, MD      . rosuvastatin (CRESTOR) tablet 40 mg  40 mg Oral Daily Troy Sine, MD   40 mg at 06/11/20 0945  . sodium chloride flush (NS) 0.9 % injection 3 mL  3 mL Intravenous Q12H Troy Sine, MD   3 mL at 06/11/20 0946  . sodium chloride flush (NS) 0.9 % injection 3 mL  3 mL Intravenous PRN Troy Sine, MD        Medications Prior to Admission  Medication Sig Dispense Refill Last Dose  . amLODipine (NORVASC) 10 MG tablet Take 10 mg by mouth daily.   06/09/2020 at 0630  . ascorbic acid (VITAMIN C) 1000 MG tablet Take 1,000 mg by mouth daily.    06/09/2020 at Unknown time  . aspirin EC 81 MG tablet Take 81 mg by mouth at bedtime.    06/09/2020 at 0630  . benazepril (LOTENSIN) 40 MG tablet Take 40 mg by mouth daily.   4 Past Week at Unknown time  . chlorthalidone (HYGROTON) 25 MG tablet Take 25 mg by mouth daily.   Past Week at Unknown time  . Cholecalciferol (VITAMIN D3) 50 MCG (2000 UT) capsule Take 2,000 Units by mouth every evening.    06/08/2020 at Unknown time  . fenofibrate (TRICOR) 145 MG tablet Take 145 mg by mouth every evening.    06/08/2020 at Unknown time   . fish oil-omega-3 fatty acids 1000  MG capsule Take 1 g by mouth at bedtime.    06/08/2020 at Unknown time  . folic acid (FOLVITE) 478 MCG tablet Take 400 mcg by mouth daily.    06/09/2020 at 0630  . Garlic 2956 MG CAPS Take 1,000 mg by mouth at bedtime.    06/08/2020 at Unknown time  . Lysine 500 MG CAPS Take 500 mg by mouth daily.    06/09/2020 at 0630  . metoprolol succinate (TOPROL-XL) 25 MG 24 hr tablet Take by mouth in the morning and at bedtime.   06/09/2020 at Unknown time  . Multiple Vitamin (MULTIVITAMIN WITH MINERALS) TABS Take 1 tablet by mouth daily.   06/09/2020 at 0630  . Multiple Vitamins-Minerals (PRESERVISION AREDS 2+MULTI VIT PO) Take 1 capsule by mouth 2 (two) times daily.   06/09/2020 at Unknown time  . rosuvastatin (CRESTOR) 5 MG tablet Take 5 mg by mouth every evening.    06/08/2020 at Unknown time  . zinc gluconate 50 MG tablet Take 50 mg by mouth every evening.    Past Week at Unknown time    Family History  Problem Relation Age of Onset  . COPD Mother   . Heart disease Father   . Stroke Father   . Breast cancer Sister   . Melanoma Brother   . Kidney disease Maternal Grandmother      Review of Systems:   ROS Pertinent items are noted in HPI.     Cardiac Review of Systems: Y or  [    ]= no  Chest Pain [    ]  Resting SOB [   ] Exertional SOB  [  ]  Orthopnea [  ]   Pedal Edema [   ]    Palpitations [  ] Syncope  [  ]   Presyncope [   ]  General Review of Systems: [Y] = yes [  ]=no Constitional: recent weight change [  ]; anorexia [  ]; fatigue [  ]; nausea [  ]; night sweats [  ]; fever [  ]; or chills [  ]                                                               Dental: Last Dentist visit:   Eye : blurred vision [  ]; diplopia [   ]; vision changes [  ];  Amaurosis fugax[  ]; Resp: cough [  ];  wheezing[  ];  hemoptysis[  ]; shortness of breath[  ]; paroxysmal nocturnal dyspnea[  ]; dyspnea on exertion[  ]; or orthopnea[  ];  GI:  gallstones[  ],  vomiting[  ];  dysphagia[  ]; melena[  ];  hematochezia [  ]; heartburn[  ];   Hx of  Colonoscopy[  ]; GU: kidney stones [  ]; hematuria[  ];   dysuria [  ];  nocturia[  ];  history of     obstruction [  ]; urinary frequency [  ]             Skin: rash, swelling[  ];, hair loss[  ];  peripheral edema[  ];  or itching[  ]; Musculosketetal: myalgias[  ];  joint swelling[  ];  joint erythema[  ];  joint pain[  ];  back pain[  ];  Heme/Lymph: bruising[  ];  bleeding[  ];  anemia[  ];  Neuro: TIA[  ];  headaches[  ];  stroke[  ];  vertigo[  ];  seizures[  ];   paresthesias[  ];  difficulty walking[  ];  Psych:depression[  ]; anxiety[  ];  Endocrine: diabetes[  ];  thyroid dysfunction[  ];             Physical Exam: BP 129/66 (BP Location: Left Arm)   Pulse 74   Temp 98.8 F (37.1 C) (Oral)   Resp 16   Ht 6' (1.829 m)   Wt 87 kg   SpO2 92%   BMI 26.00 kg/m    General appearance: alert and cooperative Resp: clear to auscultation bilaterally Cardio: regular rate and rhythm, S1, S2 normal, no murmur, click, rub or gallop GI: soft, non-tender; bowel sounds normal; no masses,  no organomegaly Extremities: extremities normal, atraumatic, no cyanosis or edema  Diagnostic Studies & Laboratory data:     Recent Radiology Findings:   CT CHEST WO CONTRAST  Result Date: 06/10/2020 CLINICAL DATA:  Nontraumatic aortic disease. EXAM: CT CHEST WITHOUT CONTRAST TECHNIQUE: Multidetector CT imaging of the chest was performed following the standard protocol without IV contrast. COMPARISON:  March 27, 2015. FINDINGS: Cardiovascular: Atherosclerosis of thoracic aorta is noted without aneurysm formation. Normal cardiac size. No pericardial effusion. Extensive coronary artery calcifications are noted suggesting coronary artery disease. Mediastinum/Nodes: No enlarged mediastinal or axillary lymph nodes. Thyroid gland, trachea, and esophagus demonstrate no significant findings. Lungs/Pleura: No pneumothorax  or pleural effusion is noted. Stable bibasilar subsegmental atelectasis or scarring is noted. Scarring is also noted in both upper lobes. Upper Abdomen: Probable bilateral nephrolithiasis is noted. Musculoskeletal: No chest wall mass or suspicious bone lesions identified. IMPRESSION: 1. Extensive coronary artery calcifications are noted suggesting coronary artery disease. 2. Stable bibasilar subsegmental atelectasis or scarring is noted. 3. Probable bilateral nephrolithiasis. 4. Aortic atherosclerosis. Aortic Atherosclerosis (ICD10-I70.0). Electronically Signed   By: Marijo Conception M.D.   On: 06/10/2020 14:20   ECHOCARDIOGRAM COMPLETE  Result Date: 06/10/2020    ECHOCARDIOGRAM REPORT   Patient Name:   Carl Young Date of Exam: 06/10/2020 Medical Rec #:  [id number]       Height:       72.0 in Accession #:    [id number]      Weight:       193.6 lb Date of Birth:  06/03/1941       BSA:          2.101 m Patient Age:    7 years        BP:           133/70 mmHg Patient Gender: M               HR:           69 bpm. Exam Location:  Inpatient Procedure: 2D Echo, Cardiac Doppler and Color Doppler Indications:    CAD  History:        Patient has no prior history of Echocardiogram examinations.                 CAD, Aortic Valve Disease and aortic stenosis; Risk                 Factors:Dyslipidemia.  Sonographer:    Dustin Flock Referring Phys: [place]  1. Left ventricular ejection fraction, by estimation, is 55 to  60%. The left ventricle has normal function. The left ventricle has no regional wall motion abnormalities. There is mild concentric left ventricular hypertrophy. Left ventricular diastolic parameters are consistent with Grade I diastolic dysfunction (impaired relaxation).  2. Right ventricular systolic function is normal. The right ventricular size is normal. There is normal pulmonary artery systolic pressure. The estimated right ventricular systolic pressure is 84.1 mmHg.  3.  The mitral valve is normal in structure. Mild mitral valve regurgitation. No evidence of mitral stenosis.  4. The aortic valve is normal in structure. There is severe calcifcation of the aortic valve. There is severe thickening of the aortic valve. Aortic valve regurgitation is trivial. Mild aortic valve stenosis. Aortic valve mean gradient measures 11.0 mmHg.  5. The inferior vena cava is normal in size with greater than 50% respiratory variability, suggesting right atrial pressure of 3 mmHg. FINDINGS  Left Ventricle: Left ventricular ejection fraction, by estimation, is 55 to 60%. The left ventricle has normal function. The left ventricle has no regional wall motion abnormalities. The left ventricular internal cavity size was normal in size. There is  mild concentric left ventricular hypertrophy. Left ventricular diastolic parameters are consistent with Grade I diastolic dysfunction (impaired relaxation). Normal left ventricular filling pressure. Right Ventricle: The right ventricular size is normal. No increase in right ventricular wall thickness. Right ventricular systolic function is normal. There is normal pulmonary artery systolic pressure. The tricuspid regurgitant velocity is 1.93 m/s, and  with an assumed right atrial pressure of 3 mmHg, the estimated right ventricular systolic pressure is 66.0 mmHg. Left Atrium: Left atrial size was normal in size. Right Atrium: Right atrial size was normal in size. Pericardium: There is no evidence of pericardial effusion. Mitral Valve: The mitral valve is normal in structure. Mild mitral valve regurgitation. No evidence of mitral valve stenosis. Tricuspid Valve: The tricuspid valve is normal in structure. Tricuspid valve regurgitation is not demonstrated. No evidence of tricuspid stenosis. Aortic Valve: The aortic valve is normal in structure. There is severe calcifcation of the aortic valve. There is severe thickening of the aortic valve. Aortic valve regurgitation is  trivial. Mild aortic stenosis is present. Aortic valve mean gradient measures 11.0 mmHg. Aortic valve peak gradient measures 19.8 mmHg. Aortic valve area, by VTI measures 1.44 cm. Pulmonic Valve: The pulmonic valve was normal in structure. Pulmonic valve regurgitation is not visualized. No evidence of pulmonic stenosis. Aorta: The aortic root is normal in size and structure. Venous: The inferior vena cava is normal in size with greater than 50% respiratory variability, suggesting right atrial pressure of 3 mmHg. IAS/Shunts: No atrial level shunt detected by color flow Doppler.  LEFT VENTRICLE PLAX 2D LVIDd:         5.10 cm  Diastology LVIDs:         3.50 cm  LV e' medial:    5.55 cm/s LV PW:         1.30 cm  LV E/e' medial:  12.9 LV IVS:        1.20 cm  LV e' lateral:   9.36 cm/s LVOT diam:     2.10 cm  LV E/e' lateral: 7.6 LV SV:         65 LV SV Index:   31 LVOT Area:     3.46 cm  RIGHT VENTRICLE RV Basal diam:  2.50 cm RV S prime:     5.98 cm/s TAPSE (M-mode): 2.4 cm LEFT ATRIUM  Index       RIGHT ATRIUM           Index LA diam:        3.80 cm 1.81 cm/m  RA Area:     13.50 cm LA Vol (A2C):   37.6 ml 17.89 ml/m RA Volume:   29.30 ml  13.94 ml/m LA Vol (A4C):   32.4 ml 15.42 ml/m LA Biplane Vol: 37.2 ml 17.70 ml/m  AORTIC VALVE AV Area (Vmax):    1.53 cm AV Area (Vmean):   1.45 cm AV Area (VTI):     1.44 cm AV Vmax:           222.50 cm/s AV Vmean:          157.000 cm/s AV VTI:            0.454 m AV Peak Grad:      19.8 mmHg AV Mean Grad:      11.0 mmHg LVOT Vmax:         98.60 cm/s LVOT Vmean:        65.700 cm/s LVOT VTI:          0.189 m LVOT/AV VTI ratio: 0.42  AORTA Ao Root diam: 3.70 cm MITRAL VALVE               TRICUSPID VALVE MV Area (PHT): 2.83 cm    TR Peak grad:   14.9 mmHg MV Decel Time: 268 msec    TR Vmax:        193.00 cm/s MV E velocity: 71.60 cm/s MV A velocity: 87.00 cm/s  SHUNTS MV E/A ratio:  0.82        Systemic VTI:  0.19 m                            Systemic Diam: 2.10  cm Ena Dawley MD Electronically signed by Ena Dawley MD Signature Date/Time: 06/10/2020/1:22:33 PM    Final    VAS US DOPPLER PRE CABG  Result Date: 06/10/2020 PREOPERATIVE VASCULAR EVALUATION  Indications:      Pre-CABG. Risk Factors:     Hypertension, hyperlipidemia, coronary artery disease. Comparison Study: No prior study on file Performing Technologist: Sharion Dove RVS  Examination Guidelines: A complete evaluation includes B-mode imaging, spectral Doppler, color Doppler, and power Doppler as needed of all accessible portions of each vessel. Bilateral testing is considered an integral part of a complete examination. Limited examinations for reoccurring indications may be performed as noted.  Right Carotid Findings: +----------+--------+--------+--------+--------------------------+---------+           PSV cm/sEDV cm/sStenosisDescribe                  Comments  +----------+--------+--------+--------+--------------------------+---------+ CCA Prox  101     23              heterogenous and irregular          +----------+--------+--------+--------+--------------------------+---------+ CCA Distal75      12              heterogenous and irregular          +----------+--------+--------+--------+--------------------------+---------+ ICA Prox  109     14      1-39%   heterogenous              Shadowing +----------+--------+--------+--------+--------------------------+---------+ ICA Mid   88      16                                                  +----------+--------+--------+--------+--------------------------+---------+  ICA Distal88      17                                                  +----------+--------+--------+--------+--------------------------+---------+ ECA       141     14                                                  +----------+--------+--------+--------+--------------------------+---------+ Portions of this table do not appear on this  page. +----------+--------+-------+--------+------------+           PSV cm/sEDV cmsDescribeArm Pressure +----------+--------+-------+--------+------------+ Subclavian92                                  +----------+--------+-------+--------+------------+ +---------+--------+--+--------+-+ VertebralPSV cm/s25EDV cm/s6 +---------+--------+--+--------+-+ Left Carotid Findings: +----------+--------+--------+--------+------------+------------------+           PSV cm/sEDV cm/sStenosisDescribe    Comments           +----------+--------+--------+--------+------------+------------------+ CCA Prox  94      6                           intimal thickening +----------+--------+--------+--------+------------+------------------+ CCA Mid                           calcific                       +----------+--------+--------+--------+------------+------------------+ CCA Distal135     13                                             +----------+--------+--------+--------+------------+------------------+ ICA Prox  69      19      1-39%   heterogenous                   +----------+--------+--------+--------+------------+------------------+ ICA Distal68      17                                             +----------+--------+--------+--------+------------+------------------+ ECA       128     2                                              +----------+--------+--------+--------+------------+------------------+ +----------+--------+--------+--------+------------+ SubclavianPSV cm/sEDV cm/sDescribeArm Pressure +----------+--------+--------+--------+------------+           114                                  +----------+--------+--------+--------+------------+ +---------+--------+--+--------+-+ VertebralPSV cm/s41EDV cm/s9 +---------+--------+--+--------+-+  ABI Findings: +--------+------------------+-----+-----------+--------+ Right   Rt Pressure  (mmHg)IndexWaveform   Comment  +--------+------------------+-----+-----------+--------+ [id number]  triphasic           +--------+------------------+-----+-----------+--------+ PTA                            multiphasic         +--------+------------------+-----+-----------+--------+ DP                             multiphasic         +--------+------------------+-----+-----------+--------+ +--------+------------------+-----+-----------+-------+ Left    Lt Pressure (mmHg)IndexWaveform   Comment +--------+------------------+-----+-----------+-------+ Brachial                       triphasic          +--------+------------------+-----+-----------+-------+ PTA                            multiphasic        +--------+------------------+-----+-----------+-------+ DP                             multiphasic        +--------+------------------+-----+-----------+-------+  Right Doppler Findings: +--------+--------+-----+-----------+--------+ Site    PressureIndexDoppler    Comments +--------+--------+-----+-----------+--------+ Brachial130          triphasic           +--------+--------+-----+-----------+--------+ Radial               multiphasic         +--------+--------+-----+-----------+--------+ Ulnar                multiphasic         +--------+--------+-----+-----------+--------+  Left Doppler Findings: +--------+--------+-----+-----------+--------+ Site    PressureIndexDoppler    Comments +--------+--------+-----+-----------+--------+ Brachial             triphasic           +--------+--------+-----+-----------+--------+ Radial               multiphasic         +--------+--------+-----+-----------+--------+ Ulnar                multiphasic         +--------+--------+-----+-----------+--------+  Summary: Right Carotid: Velocities in the right ICA are consistent with a 1-39% stenosis. Left Carotid: Velocities  in the left ICA are consistent with a 1-39% stenosis. Vertebrals:  Bilateral vertebral arteries demonstrate antegrade flow. Subclavians: Normal flow hemodynamics were seen in bilateral subclavian              arteries. Right Upper Extremity: Doppler waveform obliterate with right radial compression. Doppler waveforms remain within normal limits with right ulnar compression. Left Upper Extremity: Doppler waveform obliterate with left radial compression. Doppler waveforms remain within normal limits with left ulnar compression.     Preliminary      I have independently reviewed the above radiologic studies and discussed with the patient   Recent Lab Findings: Lab Results  Component Value Date   WBC 7.0 06/11/2020   HGB 12.7 (L) 06/11/2020   HCT 36.7 (L) 06/11/2020   PLT 151 06/11/2020   GLUCOSE 108 (H) 06/11/2020   ALT 33 03/24/2020   AST 37 03/24/2020   NA 137 06/11/2020   K 3.9 06/11/2020   CL 102 06/11/2020   CREATININE 1.47 (H) 06/11/2020   BUN 26 (H) 06/11/2020   CO2 24 06/11/2020      Assessment /  Plan:      79 year old man with multivessel coronary artery disease.  He has been thoroughly evaluated and is considered a good candidate for surgery.  He is eager to have this performed soon as possible.  We will tentatively plan for Monday morning with Dr. Gilford Raid.    I  spent 30 minutes counseling the patient face to face.   Noel Rodier Z. Orvan Seen, [place] 06/11/2020 2:12 PM

## 2020-06-11 NOTE — Anesthesia Preprocedure Evaluation (Addendum)
Anesthesia Evaluation  Patient identified by MRN, date of birth, ID band Patient awake    Reviewed: Allergy & Precautions, NPO status , Patient's Chart, lab work & pertinent test results, reviewed documented beta blocker date and time   Airway Mallampati: II  TM Distance: <3 FB Neck ROM: Full    Dental  (+) Missing, Edentulous Upper   Pulmonary former smoker,    Pulmonary exam normal breath sounds clear to auscultation       Cardiovascular hypertension, Pt. on medications and Pt. on home beta blockers + angina + CAD  Normal cardiovascular exam Rhythm:Regular Rate:Normal  Echo 06/10/20: IMPRESSIONS    1. Left ventricular ejection fraction, by estimation, is 55 to 60%. The  left ventricle has normal function. The left ventricle has no regional  wall motion abnormalities. There is mild concentric left ventricular  hypertrophy. Left ventricular diastolic  parameters are consistent with Grade I diastolic dysfunction (impaired  relaxation).  2. Right ventricular systolic function is normal. The right ventricular  size is normal. There is normal pulmonary artery systolic pressure. The  estimated right ventricular systolic pressure is 93.2 mmHg.  3. The mitral valve is normal in structure. Mild mitral valve  regurgitation. No evidence of mitral stenosis.  4. The aortic valve is normal in structure. There is severe calcifcation  of the aortic valve. There is severe thickening of the aortic valve.  Aortic valve regurgitation is trivial. Mild aortic valve stenosis. Aortic  valve mean gradient measures 11.0  mmHg.  5. The inferior vena cava is normal in size with greater than 50%  respiratory variability, suggesting right atrial pressure of 3 mmHg.    Neuro/Psych  Neuromuscular disease    GI/Hepatic negative GI ROS, Neg liver ROS,   Endo/Other  negative endocrine ROS  Renal/GU Renal InsufficiencyRenal disease      Musculoskeletal negative musculoskeletal ROS (+)   Abdominal   Peds  Hematology  (+) Blood dyscrasia, anemia ,   Anesthesia Other Findings Day of surgery medications reviewed with the patient.  Reproductive/Obstetrics                            Anesthesia Physical Anesthesia Plan  ASA: IV  Anesthesia Plan: General   Post-op Pain Management:    Induction: Intravenous  PONV Risk Score and Plan: 2 and Treatment may vary due to age or medical condition  Airway Management Planned: Oral ETT  Additional Equipment: Arterial line, CVP, PA Cath, TEE and Ultrasound Guidance Line Placement  Intra-op Plan:   Post-operative Plan: Post-operative intubation/ventilation  Informed Consent: I have reviewed the patients History and Physical, chart, labs and discussed the procedure including the risks, benefits and alternatives for the proposed anesthesia with the patient or authorized representative who has indicated his/her understanding and acceptance.       Plan Discussed with: CRNA  Anesthesia Plan Comments:        Anesthesia Quick Evaluation

## 2020-06-11 NOTE — Progress Notes (Signed)
Subjective:  No angina for CABG Monday   Objective:  Vitals:   06/10/20 0522 06/10/20 0914 06/10/20 2024 06/11/20 0515  BP: (!) 97/59 133/70 122/77 138/75  Pulse: 60 69  66  Resp: 18   16  Temp: 97.6 F (36.4 C)  98.1 F (36.7 C) 97.8 F (36.6 C)  TempSrc: Oral  Oral Oral  SpO2: 94%  96% 98%  Weight: 87.8 kg   87 kg  Height:        Intake/Output from previous day:  Intake/Output Summary (Last 24 hours) at 06/11/2020 4193 Last data filed at 06/11/2020 7902 Gross per 24 hour  Intake 582 ml  Output 2475 ml  Net -1893 ml    Physical Exam: Affect appropriate Healthy:  appears stated age HEENT: normal Neck supple with no adenopathy JVP normal no bruits no thyromegaly Lungs clear with no wheezing and good diaphragmatic motion Heart:  S1/S2 AS  murmur, no rub, gallop or click PMI normal Abdomen: benighn, BS positve, no tenderness, no AAA no bruit.  No HSM or HJR Distal pulses intact with no bruits No edema Neuro non-focal Skin warm and dry No muscular weakness   Lab Results: Basic Metabolic Panel: Recent Labs    06/10/20 0621 06/11/20 0528  NA 139 137  K 3.7 3.9  CL 106 102  CO2 25 24  GLUCOSE 88 108*  BUN 21 26*  CREATININE 1.38* 1.47*  CALCIUM 9.0 9.5   Liver Function Tests: No results for input(s): AST, ALT, ALKPHOS, BILITOT, PROT, ALBUMIN in the last 72 hours. No results for input(s): LIPASE, AMYLASE in the last 72 hours. CBC: Recent Labs    06/10/20 0621 06/11/20 0528  WBC 5.8 7.0  HGB 11.8* 12.7*  HCT 35.4* 36.7*  MCV 95.4 93.9  PLT 138* 151    Imaging: CT CHEST WO CONTRAST  Result Date: 06/10/2020 CLINICAL DATA:  Nontraumatic aortic disease. EXAM: CT CHEST WITHOUT CONTRAST TECHNIQUE: Multidetector CT imaging of the chest was performed following the standard protocol without IV contrast. COMPARISON:  March 27, 2015. FINDINGS: Cardiovascular: Atherosclerosis of thoracic aorta is noted without aneurysm formation. Normal cardiac  size. No pericardial effusion. Extensive coronary artery calcifications are noted suggesting coronary artery disease. Mediastinum/Nodes: No enlarged mediastinal or axillary lymph nodes. Thyroid gland, trachea, and esophagus demonstrate no significant findings. Lungs/Pleura: No pneumothorax or pleural effusion is noted. Stable bibasilar subsegmental atelectasis or scarring is noted. Scarring is also noted in both upper lobes. Upper Abdomen: Probable bilateral nephrolithiasis is noted. Musculoskeletal: No chest wall mass or suspicious bone lesions identified. IMPRESSION: 1. Extensive coronary artery calcifications are noted suggesting coronary artery disease. 2. Stable bibasilar subsegmental atelectasis or scarring is noted. 3. Probable bilateral nephrolithiasis. 4. Aortic atherosclerosis. Aortic Atherosclerosis (ICD10-I70.0). Electronically Signed   By: Marijo Conception M.D.   On: 06/10/2020 14:20   CARDIAC CATHETERIZATION  Result Date: 06/09/2020  Prox RCA-1 lesion is 80% stenosed.  Prox RCA-2 lesion is 80% stenosed.  Mid RCA lesion is 95% stenosed.  Ost LAD to Prox LAD lesion is 95% stenosed.  Mid LAD lesion is 70% stenosed.  Ramus lesion is 85% stenosed.  LPAV-1 lesion is 90% stenosed.  LPAV-2 lesion is 100% stenosed.  Lat 2nd Mrg lesion is 80% stenosed.  2nd Mrg-1 lesion is 50% stenosed.  2nd Mrg-2 lesion is 90% stenosed.  Mid Cx lesion is 70% stenosed.  LV end diastolic pressure is normal.  The left ventricular systolic function is normal.  Severe multivessel CAD with eccentric  95% ostial calcified LAD stenosis, diffuse proximal to mid LAD stenoses with collateralization to the LAD from the RCA; diffuse proximal 85% ramus intermediate stenosis ; segmental stenoses in the proximal circumflex, OM1 vessel, distal circumflex with probable occlusion of the very distal vessel with collateralization to the PDA branch proximal circumflex; and segmental diffuse 80% proximal to mid RCA stenoses with 95%  mid stenosis. Mild LV dysfunction with EF estimated  At 40 - 45%. There is mild inferoapical and focal mid anterolateral hypocontractility. LVEDP 12 mmHg. RECOMMENDATION: The patient will be admitted due to high-grade multivessel stenoses. Surgical consultation for CABG revascularization. With very high-grade ostial ID disease, will initiate heparin later this evening.   ECHOCARDIOGRAM COMPLETE  Result Date: 06/10/2020    ECHOCARDIOGRAM REPORT   Patient Name:   Carl Young Date of Exam: 06/10/2020 Medical Rec #:  [id number]       Height:       72.0 in Accession #:    [id number]      Weight:       193.6 lb Date of Birth:  1941-03-15       BSA:          2.101 m Patient Age:    79 years        BP:           133/70 mmHg Patient Gender: M               HR:           69 bpm. Exam Location:  Inpatient Procedure: 2D Echo, Cardiac Doppler and Color Doppler Indications:    CAD  History:        Patient has no prior history of Echocardiogram examinations.                 CAD, Aortic Valve Disease and aortic stenosis; Risk                 Factors:Dyslipidemia.  Sonographer:    Dustin Flock Referring Phys: [place]  1. Left ventricular ejection fraction, by estimation, is 55 to 60%. The left ventricle has normal function. The left ventricle has no regional wall motion abnormalities. There is mild concentric left ventricular hypertrophy. Left ventricular diastolic parameters are consistent with Grade I diastolic dysfunction (impaired relaxation).  2. Right ventricular systolic function is normal. The right ventricular size is normal. There is normal pulmonary artery systolic pressure. The estimated right ventricular systolic pressure is 86.5 mmHg.  3. The mitral valve is normal in structure. Mild mitral valve regurgitation. No evidence of mitral stenosis.  4. The aortic valve is normal in structure. There is severe calcifcation of the aortic valve. There is severe thickening of the aortic valve.  Aortic valve regurgitation is trivial. Mild aortic valve stenosis. Aortic valve mean gradient measures 11.0 mmHg.  5. The inferior vena cava is normal in size with greater than 50% respiratory variability, suggesting right atrial pressure of 3 mmHg. FINDINGS  Left Ventricle: Left ventricular ejection fraction, by estimation, is 55 to 60%. The left ventricle has normal function. The left ventricle has no regional wall motion abnormalities. The left ventricular internal cavity size was normal in size. There is  mild concentric left ventricular hypertrophy. Left ventricular diastolic parameters are consistent with Grade I diastolic dysfunction (impaired relaxation). Normal left ventricular filling pressure. Right Ventricle: The right ventricular size is normal. No increase in right ventricular wall thickness. Right ventricular systolic function is normal. There is  normal pulmonary artery systolic pressure. The tricuspid regurgitant velocity is 1.93 m/s, and  with an assumed right atrial pressure of 3 mmHg, the estimated right ventricular systolic pressure is 12.7 mmHg. Left Atrium: Left atrial size was normal in size. Right Atrium: Right atrial size was normal in size. Pericardium: There is no evidence of pericardial effusion. Mitral Valve: The mitral valve is normal in structure. Mild mitral valve regurgitation. No evidence of mitral valve stenosis. Tricuspid Valve: The tricuspid valve is normal in structure. Tricuspid valve regurgitation is not demonstrated. No evidence of tricuspid stenosis. Aortic Valve: The aortic valve is normal in structure. There is severe calcifcation of the aortic valve. There is severe thickening of the aortic valve. Aortic valve regurgitation is trivial. Mild aortic stenosis is present. Aortic valve mean gradient measures 11.0 mmHg. Aortic valve peak gradient measures 19.8 mmHg. Aortic valve area, by VTI measures 1.44 cm. Pulmonic Valve: The pulmonic valve was normal in structure. Pulmonic  valve regurgitation is not visualized. No evidence of pulmonic stenosis. Aorta: The aortic root is normal in size and structure. Venous: The inferior vena cava is normal in size with greater than 50% respiratory variability, suggesting right atrial pressure of 3 mmHg. IAS/Shunts: No atrial level shunt detected by color flow Doppler.  LEFT VENTRICLE PLAX 2D LVIDd:         5.10 cm  Diastology LVIDs:         3.50 cm  LV e' medial:    5.55 cm/s LV PW:         1.30 cm  LV E/e' medial:  12.9 LV IVS:        1.20 cm  LV e' lateral:   9.36 cm/s LVOT diam:     2.10 cm  LV E/e' lateral: 7.6 LV SV:         65 LV SV Index:   31 LVOT Area:     3.46 cm  RIGHT VENTRICLE RV Basal diam:  2.50 cm RV S prime:     5.98 cm/s TAPSE (M-mode): 2.4 cm LEFT ATRIUM             Index       RIGHT ATRIUM           Index LA diam:        3.80 cm 1.81 cm/m  RA Area:     13.50 cm LA Vol (A2C):   37.6 ml 17.89 ml/m RA Volume:   29.30 ml  13.94 ml/m LA Vol (A4C):   32.4 ml 15.42 ml/m LA Biplane Vol: 37.2 ml 17.70 ml/m  AORTIC VALVE AV Area (Vmax):    1.53 cm AV Area (Vmean):   1.45 cm AV Area (VTI):     1.44 cm AV Vmax:           222.50 cm/s AV Vmean:          157.000 cm/s AV VTI:            0.454 m AV Peak Grad:      19.8 mmHg AV Mean Grad:      11.0 mmHg LVOT Vmax:         98.60 cm/s LVOT Vmean:        65.700 cm/s LVOT VTI:          0.189 m LVOT/AV VTI ratio: 0.42  AORTA Ao Root diam: 3.70 cm MITRAL VALVE               TRICUSPID VALVE MV Area (PHT): 2.83  cm    TR Peak grad:   14.9 mmHg MV Decel Time: 268 msec    TR Vmax:        193.00 cm/s MV E velocity: 71.60 cm/s MV A velocity: 87.00 cm/s  SHUNTS MV E/A ratio:  0.82        Systemic VTI:  0.19 m                            Systemic Diam: 2.10 cm Ena Dawley MD Electronically signed by Ena Dawley MD Signature Date/Time: 06/10/2020/1:22:33 PM    Final    VAS US DOPPLER PRE CABG  Result Date: 06/10/2020 PREOPERATIVE VASCULAR EVALUATION  Indications:      Pre-CABG. Risk Factors:      Hypertension, hyperlipidemia, coronary artery disease. Comparison Study: No prior study on file Performing Technologist: Sharion Dove RVS  Examination Guidelines: A complete evaluation includes B-mode imaging, spectral Doppler, color Doppler, and power Doppler as needed of all accessible portions of each vessel. Bilateral testing is considered an integral part of a complete examination. Limited examinations for reoccurring indications may be performed as noted.  Right Carotid Findings: +----------+--------+--------+--------+--------------------------+---------+           PSV cm/sEDV cm/sStenosisDescribe                  Comments  +----------+--------+--------+--------+--------------------------+---------+ CCA Prox  101     23              heterogenous and irregular          +----------+--------+--------+--------+--------------------------+---------+ CCA Distal75      12              heterogenous and irregular          +----------+--------+--------+--------+--------------------------+---------+ ICA Prox  109     14      1-39%   heterogenous              Shadowing +----------+--------+--------+--------+--------------------------+---------+ ICA Mid   88      16                                                  +----------+--------+--------+--------+--------------------------+---------+ ICA Distal88      17                                                  +----------+--------+--------+--------+--------------------------+---------+ ECA       141     14                                                  +----------+--------+--------+--------+--------------------------+---------+ Portions of this table do not appear on this page. +----------+--------+-------+--------+------------+           PSV cm/sEDV cmsDescribeArm Pressure +----------+--------+-------+--------+------------+ Subclavian92                                   +----------+--------+-------+--------+------------+ +---------+--------+--+--------+-+ VertebralPSV cm/s25EDV cm/s6 +---------+--------+--+--------+-+ Left Carotid Findings: +----------+--------+--------+--------+------------+------------------+  PSV cm/sEDV cm/sStenosisDescribe    Comments           +----------+--------+--------+--------+------------+------------------+ CCA Prox  94      6                           intimal thickening +----------+--------+--------+--------+------------+------------------+ CCA Mid                           calcific                       +----------+--------+--------+--------+------------+------------------+ CCA Distal135     13                                             +----------+--------+--------+--------+------------+------------------+ ICA Prox  69      19      1-39%   heterogenous                   +----------+--------+--------+--------+------------+------------------+ ICA Distal68      17                                             +----------+--------+--------+--------+------------+------------------+ ECA       128     2                                              +----------+--------+--------+--------+------------+------------------+ +----------+--------+--------+--------+------------+ SubclavianPSV cm/sEDV cm/sDescribeArm Pressure +----------+--------+--------+--------+------------+           114                                  +----------+--------+--------+--------+------------+ +---------+--------+--+--------+-+ VertebralPSV cm/s41EDV cm/s9 +---------+--------+--+--------+-+  ABI Findings: +--------+------------------+-----+-----------+--------+ Right   Rt Pressure (mmHg)IndexWaveform   Comment  +--------+------------------+-----+-----------+--------+ Brachial130                    triphasic           +--------+------------------+-----+-----------+--------+ PTA                             multiphasic         +--------+------------------+-----+-----------+--------+ DP                             multiphasic         +--------+------------------+-----+-----------+--------+ +--------+------------------+-----+-----------+-------+ Left    Lt Pressure (mmHg)IndexWaveform   Comment +--------+------------------+-----+-----------+-------+ Brachial                       triphasic          +--------+------------------+-----+-----------+-------+ PTA                            multiphasic        +--------+------------------+-----+-----------+-------+ DP  multiphasic        +--------+------------------+-----+-----------+-------+  Right Doppler Findings: +--------+--------+-----+-----------+--------+ Site    PressureIndexDoppler    Comments +--------+--------+-----+-----------+--------+ Brachial130          triphasic           +--------+--------+-----+-----------+--------+ Radial               multiphasic         +--------+--------+-----+-----------+--------+ Ulnar                multiphasic         +--------+--------+-----+-----------+--------+  Left Doppler Findings: +--------+--------+-----+-----------+--------+ Site    PressureIndexDoppler    Comments +--------+--------+-----+-----------+--------+ Brachial             triphasic           +--------+--------+-----+-----------+--------+ Radial               multiphasic         +--------+--------+-----+-----------+--------+ Ulnar                multiphasic         +--------+--------+-----+-----------+--------+  Summary: Right Carotid: Velocities in the right ICA are consistent with a 1-39% stenosis. Left Carotid: Velocities in the left ICA are consistent with a 1-39% stenosis. Vertebrals:  Bilateral vertebral arteries demonstrate antegrade flow. Subclavians: Normal flow hemodynamics were seen in bilateral subclavian              arteries. Right  Upper Extremity: Doppler waveform obliterate with right radial compression. Doppler waveforms remain within normal limits with right ulnar compression. Left Upper Extremity: Doppler waveform obliterate with left radial compression. Doppler waveforms remain within normal limits with left ulnar compression.     Preliminary     Cardiac Studies:  ECG: SR rate 57 anterolateral T wave changes   Telemetry:  NSR occasional PVC;s 06/11/2020   Echo: pending October 2021 [place] mild AS mean gradient 12 mmHg   Medications:   . amLODipine  10 mg Oral Daily  . aspirin  81 mg Oral Daily  . isosorbide mononitrate  30 mg Oral Daily  . metoprolol succinate  25 mg Oral Daily  . multivitamin  1 tablet Oral Daily  . rosuvastatin  40 mg Oral Daily  . sodium chloride flush  3 mL Intravenous Q12H     . sodium chloride Stopped (06/10/20 0400)  . sodium chloride    . heparin 1,200 Units/hr (06/10/20 2004)    Assessment/Plan:   1. CAD:  Cath with severe 3 VD EF 45-60% for CABG Monday with Dr Cyndia Bent continue heparin  Pre CABG dopplers ok  2. AS mean gradient 11 mmHg given age and severity of CAD suspect he will not get AVR especially Given option of TAVR down the road  3. HLD:  Continue statin   Jenkins Rouge 06/11/2020, 9:23 AM

## 2020-06-12 ENCOUNTER — Inpatient Hospital Stay (HOSPITAL_COMMUNITY)
Admission: RE | Disposition: A | Discharge status: Home / Self Care | Payer: Self-pay | Source: Home / Self Care | Attending: Surgery

## 2020-06-12 ENCOUNTER — Inpatient Hospital Stay (HOSPITAL_COMMUNITY): Payer: Medicare Other

## 2020-06-12 ENCOUNTER — Inpatient Hospital Stay (HOSPITAL_COMMUNITY): Payer: Medicare Other | Admitting: Anesthesiology

## 2020-06-12 ENCOUNTER — Encounter (HOSPITAL_COMMUNITY): Payer: Self-pay | Admitting: Cardiovascular Disease

## 2020-06-12 DIAGNOSIS — Z951 Presence of aortocoronary bypass graft: Secondary | ICD-10-CM

## 2020-06-12 HISTORY — PX: TEE WITHOUT CARDIOVERSION: SHX5443

## 2020-06-12 HISTORY — PX: CORONARY ARTERY BYPASS GRAFT: SHX141

## 2020-06-12 LAB — POCT I-STAT, CHEM 8
BUN: 24 mg/dL — ABNORMAL HIGH (ref 8–23)
BUN: 24 mg/dL — ABNORMAL HIGH (ref 8–23)
BUN: 22 mg/dL (ref 8–23)
BUN: 22 mg/dL (ref 8–23)
BUN: 23 mg/dL (ref 8–23)
Calcium, Ion: 1.25 mmol/L (ref 1.15–1.40)
Calcium, Ion: 1.26 mmol/L (ref 1.15–1.40)
Calcium, Ion: 1.09 mmol/L — ABNORMAL LOW (ref 1.15–1.40)
Calcium, Ion: 1.09 mmol/L — ABNORMAL LOW (ref 1.15–1.40)
Calcium, Ion: 1.11 mmol/L — ABNORMAL LOW (ref 1.15–1.40)
Chloride: 100 mmol/L (ref 98–111)
Chloride: 103 mmol/L (ref 98–111)
Chloride: 101 mmol/L (ref 98–111)
Chloride: 101 mmol/L (ref 98–111)
Chloride: 101 mmol/L (ref 98–111)
Creatinine, Ser: 1.3 mg/dL — ABNORMAL HIGH (ref 0.61–1.24)
Creatinine, Ser: 1.3 mg/dL — ABNORMAL HIGH (ref 0.61–1.24)
Creatinine, Ser: 1.1 mg/dL (ref 0.61–1.24)
Creatinine, Ser: 1.2 mg/dL (ref 0.61–1.24)
Creatinine, Ser: 1.2 mg/dL (ref 0.61–1.24)
Glucose, Bld: 101 mg/dL — ABNORMAL HIGH (ref 70–99)
Glucose, Bld: 111 mg/dL — ABNORMAL HIGH (ref 70–99)
Glucose, Bld: 113 mg/dL — ABNORMAL HIGH (ref 70–99)
Glucose, Bld: 128 mg/dL — ABNORMAL HIGH (ref 70–99)
Glucose, Bld: 133 mg/dL — ABNORMAL HIGH (ref 70–99)
HCT: 38 % — ABNORMAL LOW (ref 39.0–52.0)
HCT: 35 % — ABNORMAL LOW (ref 39.0–52.0)
HCT: 27 % — ABNORMAL LOW (ref 39.0–52.0)
HCT: 29 % — ABNORMAL LOW (ref 39.0–52.0)
HCT: 30 % — ABNORMAL LOW (ref 39.0–52.0)
Hemoglobin: 12.9 g/dL — ABNORMAL LOW (ref 13.0–17.0)
Hemoglobin: 11.9 g/dL — ABNORMAL LOW (ref 13.0–17.0)
Hemoglobin: 9.2 g/dL — ABNORMAL LOW (ref 13.0–17.0)
Hemoglobin: 10.2 g/dL — ABNORMAL LOW (ref 13.0–17.0)
Hemoglobin: 9.9 g/dL — ABNORMAL LOW (ref 13.0–17.0)
Potassium: 3.8 mmol/L (ref 3.5–5.1)
Potassium: 4.3 mmol/L (ref 3.5–5.1)
Potassium: 4.6 mmol/L (ref 3.5–5.1)
Potassium: 4.3 mmol/L (ref 3.5–5.1)
Potassium: 4.7 mmol/L (ref 3.5–5.1)
Sodium: 139 mmol/L (ref 135–145)
Sodium: 137 mmol/L (ref 135–145)
Sodium: 136 mmol/L (ref 135–145)
Sodium: 137 mmol/L (ref 135–145)
Sodium: 138 mmol/L (ref 135–145)
TCO2: 25 mmol/L (ref 22–32)
TCO2: 25 mmol/L (ref 22–32)
TCO2: 25 mmol/L (ref 22–32)
TCO2: 24 mmol/L (ref 22–32)
TCO2: 26 mmol/L (ref 22–32)

## 2020-06-12 LAB — POCT I-STAT 7, (LYTES, BLD GAS, ICA,H+H)
Acid-Base Excess: 1 mmol/L (ref 0.0–2.0)
Acid-Base Excess: 1 mmol/L (ref 0.0–2.0)
Acid-Base Excess: 2 mmol/L (ref 0.0–2.0)
Acid-base deficit: 1 mmol/L (ref 0.0–2.0)
Acid-base deficit: 1 mmol/L (ref 0.0–2.0)
Acid-base deficit: 1 mmol/L (ref 0.0–2.0)
Bicarbonate: 26.8 mmol/L (ref 20.0–28.0)
Bicarbonate: 27.4 mmol/L (ref 20.0–28.0)
Bicarbonate: 25.1 mmol/L (ref 20.0–28.0)
Bicarbonate: 23.9 mmol/L (ref 20.0–28.0)
Bicarbonate: 23.1 mmol/L (ref 20.0–28.0)
Bicarbonate: 23.4 mmol/L (ref 20.0–28.0)
Calcium, Ion: 1.27 mmol/L (ref 1.15–1.40)
Calcium, Ion: 1.1 mmol/L — ABNORMAL LOW (ref 1.15–1.40)
Calcium, Ion: 1.08 mmol/L — ABNORMAL LOW (ref 1.15–1.40)
Calcium, Ion: 1.1 mmol/L — ABNORMAL LOW (ref 1.15–1.40)
Calcium, Ion: 1.15 mmol/L (ref 1.15–1.40)
Calcium, Ion: 1.16 mmol/L (ref 1.15–1.40)
HCT: 35 % — ABNORMAL LOW (ref 39.0–52.0)
HCT: 28 % — ABNORMAL LOW (ref 39.0–52.0)
HCT: 26 % — ABNORMAL LOW (ref 39.0–52.0)
HCT: 31 % — ABNORMAL LOW (ref 39.0–52.0)
HCT: 33 % — ABNORMAL LOW (ref 39.0–52.0)
HCT: 34 % — ABNORMAL LOW (ref 39.0–52.0)
Hemoglobin: 11.9 g/dL — ABNORMAL LOW (ref 13.0–17.0)
Hemoglobin: 9.5 g/dL — ABNORMAL LOW (ref 13.0–17.0)
Hemoglobin: 8.8 g/dL — ABNORMAL LOW (ref 13.0–17.0)
Hemoglobin: 10.5 g/dL — ABNORMAL LOW (ref 13.0–17.0)
Hemoglobin: 11.2 g/dL — ABNORMAL LOW (ref 13.0–17.0)
Hemoglobin: 11.6 g/dL — ABNORMAL LOW (ref 13.0–17.0)
O2 Saturation: 100 %
O2 Saturation: 100 %
O2 Saturation: 100 %
O2 Saturation: 94 %
O2 Saturation: 97 %
O2 Saturation: 96 %
Patient temperature: 37
Patient temperature: 37.9
Patient temperature: 38.2
Potassium: 3.8 mmol/L (ref 3.5–5.1)
Potassium: 4.5 mmol/L (ref 3.5–5.1)
Potassium: 4.8 mmol/L (ref 3.5–5.1)
Potassium: 4.3 mmol/L (ref 3.5–5.1)
Potassium: 4.6 mmol/L (ref 3.5–5.1)
Potassium: 4.3 mmol/L (ref 3.5–5.1)
Sodium: 139 mmol/L (ref 135–145)
Sodium: 139 mmol/L (ref 135–145)
Sodium: 136 mmol/L (ref 135–145)
Sodium: 140 mmol/L (ref 135–145)
Sodium: 137 mmol/L (ref 135–145)
Sodium: 137 mmol/L (ref 135–145)
TCO2: 28 mmol/L (ref 22–32)
TCO2: 29 mmol/L (ref 22–32)
TCO2: 26 mmol/L (ref 22–32)
TCO2: 25 mmol/L (ref 22–32)
TCO2: 24 mmol/L (ref 22–32)
TCO2: 25 mmol/L (ref 22–32)
pCO2 arterial: 47.9 mmHg (ref 32.0–48.0)
pCO2 arterial: 51.5 mmHg — ABNORMAL HIGH (ref 32.0–48.0)
pCO2 arterial: 34.2 mmHg (ref 32.0–48.0)
pCO2 arterial: 38.8 mmHg (ref 32.0–48.0)
pCO2 arterial: 37.9 mmHg (ref 32.0–48.0)
pCO2 arterial: 38.2 mmHg (ref 32.0–48.0)
pH, Arterial: 7.356 (ref 7.350–7.450)
pH, Arterial: 7.334 — ABNORMAL LOW (ref 7.350–7.450)
pH, Arterial: 7.474 — ABNORMAL HIGH (ref 7.350–7.450)
pH, Arterial: 7.398 (ref 7.350–7.450)
pH, Arterial: 7.397 (ref 7.350–7.450)
pH, Arterial: 7.401 (ref 7.350–7.450)
pO2, Arterial: 488 mmHg — ABNORMAL HIGH (ref 83.0–108.0)
pO2, Arterial: 367 mmHg — ABNORMAL HIGH (ref 83.0–108.0)
pO2, Arterial: 288 mmHg — ABNORMAL HIGH (ref 83.0–108.0)
pO2, Arterial: 73 mmHg — ABNORMAL LOW (ref 83.0–108.0)
pO2, Arterial: 93 mmHg (ref 83.0–108.0)
pO2, Arterial: 83 mmHg (ref 83.0–108.0)

## 2020-06-12 LAB — MAGNESIUM: Magnesium: 3.2 mg/dL — ABNORMAL HIGH (ref 1.7–2.4)

## 2020-06-12 LAB — BASIC METABOLIC PANEL
Anion gap: 11 (ref 5–15)
Anion gap: 10 (ref 5–15)
BUN: 22 mg/dL (ref 8–23)
BUN: 21 mg/dL (ref 8–23)
CO2: 26 mmol/L (ref 22–32)
CO2: 22 mmol/L (ref 22–32)
Calcium: 9.5 mg/dL (ref 8.9–10.3)
Calcium: 8.1 mg/dL — ABNORMAL LOW (ref 8.9–10.3)
Chloride: 101 mmol/L (ref 98–111)
Chloride: 105 mmol/L (ref 98–111)
Creatinine, Ser: 1.37 mg/dL — ABNORMAL HIGH (ref 0.61–1.24)
Creatinine, Ser: 1.4 mg/dL — ABNORMAL HIGH (ref 0.61–1.24)
GFR, Estimated: 52 mL/min — ABNORMAL LOW (ref >60)
GFR, Estimated: 50 mL/min — ABNORMAL LOW (ref >60)
Glucose, Bld: 102 mg/dL — ABNORMAL HIGH (ref 70–99)
Glucose, Bld: 130 mg/dL — ABNORMAL HIGH (ref 70–99)
Potassium: 3.9 mmol/L (ref 3.5–5.1)
Potassium: 4.3 mmol/L (ref 3.5–5.1)
Sodium: 138 mmol/L (ref 135–145)
Sodium: 137 mmol/L (ref 135–145)

## 2020-06-12 LAB — PLATELET COUNT: Platelets: 123 10*3/uL — ABNORMAL LOW (ref 150–400)

## 2020-06-12 LAB — CBC
HCT: 40.2 % (ref 39.0–52.0)
HCT: 32.7 % — ABNORMAL LOW (ref 39.0–52.0)
HCT: 34.8 % — ABNORMAL LOW (ref 39.0–52.0)
Hemoglobin: 13.3 g/dL (ref 13.0–17.0)
Hemoglobin: 11.1 g/dL — ABNORMAL LOW (ref 13.0–17.0)
Hemoglobin: 12.1 g/dL — ABNORMAL LOW (ref 13.0–17.0)
MCH: 31.6 pg (ref 26.0–34.0)
MCH: 32 pg (ref 26.0–34.0)
MCH: 32.7 pg (ref 26.0–34.0)
MCHC: 33.1 g/dL (ref 30.0–36.0)
MCHC: 33.9 g/dL (ref 30.0–36.0)
MCHC: 34.8 g/dL (ref 30.0–36.0)
MCV: 95.5 fL (ref 80.0–100.0)
MCV: 94.2 fL (ref 80.0–100.0)
MCV: 94.1 fL (ref 80.0–100.0)
Platelets: 169 10*3/uL (ref 150–400)
Platelets: 106 10*3/uL — ABNORMAL LOW (ref 150–400)
Platelets: 131 10*3/uL — ABNORMAL LOW (ref 150–400)
RBC: 4.21 MIL/uL — ABNORMAL LOW (ref 4.22–5.81)
RBC: 3.47 MIL/uL — ABNORMAL LOW (ref 4.22–5.81)
RBC: 3.7 MIL/uL — ABNORMAL LOW (ref 4.22–5.81)
RDW: 14 % (ref 11.5–15.5)
RDW: 13.7 % (ref 11.5–15.5)
RDW: 13.9 % (ref 11.5–15.5)
WBC: 8.8 10*3/uL (ref 4.0–10.5)
WBC: 9.5 10*3/uL (ref 4.0–10.5)
WBC: 12.1 10*3/uL — ABNORMAL HIGH (ref 4.0–10.5)
nRBC: 0 % (ref 0.0–0.2)
nRBC: 0 % (ref 0.0–0.2)
nRBC: 0 % (ref 0.0–0.2)

## 2020-06-12 LAB — GLUCOSE, CAPILLARY
Glucose-Capillary: 131 mg/dL — ABNORMAL HIGH (ref 70–99)
Glucose-Capillary: 112 mg/dL — ABNORMAL HIGH (ref 70–99)
Glucose-Capillary: 117 mg/dL — ABNORMAL HIGH (ref 70–99)
Glucose-Capillary: 129 mg/dL — ABNORMAL HIGH (ref 70–99)
Glucose-Capillary: 137 mg/dL — ABNORMAL HIGH (ref 70–99)
Glucose-Capillary: 136 mg/dL — ABNORMAL HIGH (ref 70–99)
Glucose-Capillary: 134 mg/dL — ABNORMAL HIGH (ref 70–99)

## 2020-06-12 LAB — HEMOGLOBIN AND HEMATOCRIT, BLOOD
HCT: 27.7 % — ABNORMAL LOW (ref 39.0–52.0)
Hemoglobin: 9.1 g/dL — ABNORMAL LOW (ref 13.0–17.0)

## 2020-06-12 LAB — POCT I-STAT EG7
Acid-Base Excess: 0 mmol/L (ref 0.0–2.0)
Bicarbonate: 26.6 mmol/L (ref 20.0–28.0)
Calcium, Ion: 1.14 mmol/L — ABNORMAL LOW (ref 1.15–1.40)
HCT: 28 % — ABNORMAL LOW (ref 39.0–52.0)
Hemoglobin: 9.5 g/dL — ABNORMAL LOW (ref 13.0–17.0)
O2 Saturation: 79 %
Potassium: 4.1 mmol/L (ref 3.5–5.1)
Sodium: 140 mmol/L (ref 135–145)
TCO2: 28 mmol/L (ref 22–32)
pCO2, Ven: 50.6 mmHg (ref 44.0–60.0)
pH, Ven: 7.33 (ref 7.250–7.430)
pO2, Ven: 48 mmHg — ABNORMAL HIGH (ref 32.0–45.0)

## 2020-06-12 LAB — PROTIME-INR
INR: 1.3 — ABNORMAL HIGH (ref 0.8–1.2)
Prothrombin Time: 16 seconds — ABNORMAL HIGH (ref 11.4–15.2)

## 2020-06-12 LAB — ECHO INTRAOPERATIVE TEE
AV Mean grad: 9 mmHg
Height: 72 in
Weight: 3067.2 oz

## 2020-06-12 LAB — APTT: aPTT: 31 seconds (ref 24–36)

## 2020-06-12 SURGERY — CORONARY ARTERY BYPASS GRAFTING (CABG)
Anesthesia: General | Site: Chest

## 2020-06-12 MED ORDER — METOPROLOL TARTRATE 25 MG/10 ML ORAL SUSPENSION
12.5000 mg | Freq: Two times a day (BID) | ORAL | Status: DC
  Filled 2020-06-12 (×2): qty 5

## 2020-06-12 MED ORDER — OXYCODONE HCL 5 MG PO TABS
5.0000 mg | ORAL_TABLET | ORAL | Status: DC | PRN
Start: 2020-06-12 — End: 2020-06-14

## 2020-06-12 MED ORDER — VANCOMYCIN HCL 1000 MG IV SOLR
INTRAVENOUS | Status: DC | PRN
  Administered 2020-06-12: 08:00:00 1500 mg via INTRAVENOUS

## 2020-06-12 MED ORDER — DEXTROSE 50 % IV SOLN: 0.0000 mL | INTRAVENOUS | Status: DC | PRN

## 2020-06-12 MED ORDER — HEMOSTATIC AGENTS (NO CHARGE) OPTIME
TOPICAL | Status: DC | PRN
  Administered 2020-06-12: 1 via TOPICAL

## 2020-06-12 MED ORDER — PHENYLEPHRINE HCL-NACL 20-0.9 MG/250ML-% IV SOLN
0.0000 ug/min | INTRAVENOUS | Status: DC
  Administered 2020-06-12: 14:00:00 8 ug/min via INTRAVENOUS

## 2020-06-12 MED ORDER — PANTOPRAZOLE SODIUM 40 MG PO TBEC
40.0000 mg | DELAYED_RELEASE_TABLET | Freq: Every day | ORAL | Status: DC
  Administered 2020-06-14 – 2020-06-18 (×5): 40 mg via ORAL
  Filled 2020-06-12 (×5): qty 1

## 2020-06-12 MED ORDER — ALBUMIN HUMAN 5 % IV SOLN: 250.0000 mL | INTRAVENOUS | Status: DC | PRN

## 2020-06-12 MED ORDER — PHENYLEPHRINE 40 MCG/ML (10ML) SYRINGE FOR IV PUSH (FOR BLOOD PRESSURE SUPPORT)
PREFILLED_SYRINGE | INTRAVENOUS | Status: AC
  Filled 2020-06-12: qty 10

## 2020-06-12 MED ORDER — ASPIRIN EC 325 MG PO TBEC
325.0000 mg | DELAYED_RELEASE_TABLET | Freq: Every day | ORAL | Status: DC
  Administered 2020-06-13 – 2020-06-18 (×6): 325 mg via ORAL
  Filled 2020-06-12 (×6): qty 1

## 2020-06-12 MED ORDER — LACTATED RINGERS IV SOLN: INTRAVENOUS | Status: DC

## 2020-06-12 MED ORDER — FENTANYL CITRATE (PF) 250 MCG/5ML IJ SOLN
INTRAMUSCULAR | Status: DC | PRN
  Administered 2020-06-12: 50 ug via INTRAVENOUS
  Administered 2020-06-12: 250 ug via INTRAVENOUS
  Administered 2020-06-12: 150 ug via INTRAVENOUS
  Administered 2020-06-12: 200 ug via INTRAVENOUS
  Administered 2020-06-12: 50 ug via INTRAVENOUS
  Administered 2020-06-12 (×2): 150 ug via INTRAVENOUS
  Administered 2020-06-12: 100 ug via INTRAVENOUS
  Administered 2020-06-12: 200 ug via INTRAVENOUS

## 2020-06-12 MED ORDER — SODIUM CHLORIDE 0.9 % IV SOLN
1.5000 g | Freq: Two times a day (BID) | INTRAVENOUS | Status: AC
  Administered 2020-06-12 – 2020-06-14 (×4): 1.5 g via INTRAVENOUS
  Filled 2020-06-12 (×4): qty 1.5

## 2020-06-12 MED ORDER — DOCUSATE SODIUM 100 MG PO CAPS
200.0000 mg | ORAL_CAPSULE | Freq: Every day | ORAL | Status: DC
  Administered 2020-06-13 – 2020-06-16 (×4): 200 mg via ORAL
  Filled 2020-06-12 (×4): qty 2

## 2020-06-12 MED ORDER — VANCOMYCIN HCL IN DEXTROSE 1-5 GM/200ML-% IV SOLN
1000.0000 mg | Freq: Once | INTRAVENOUS | Status: AC
  Administered 2020-06-12: 20:00:00 1000 mg via INTRAVENOUS
  Filled 2020-06-12: qty 200

## 2020-06-12 MED ORDER — SODIUM CHLORIDE 0.9 % IV SOLN: 250.0000 mL | INTRAVENOUS | Status: DC

## 2020-06-12 MED ORDER — SODIUM CHLORIDE 0.45 % IV SOLN: INTRAVENOUS | Status: DC | PRN

## 2020-06-12 MED ORDER — FENTANYL CITRATE (PF) 250 MCG/5ML IJ SOLN
INTRAMUSCULAR | Status: AC
  Filled 2020-06-12: qty 5

## 2020-06-12 MED ORDER — INSULIN REGULAR(HUMAN) IN NACL 100-0.9 UT/100ML-% IV SOLN
INTRAVENOUS | Status: DC
  Administered 2020-06-12: 14:00:00 1.2 [IU]/h via INTRAVENOUS

## 2020-06-12 MED ORDER — ROCURONIUM BROMIDE 100 MG/10ML IV SOLN
INTRAVENOUS | Status: DC | PRN
  Administered 2020-06-12: 50 mg via INTRAVENOUS
  Administered 2020-06-12: 100 mg via INTRAVENOUS
  Administered 2020-06-12: 10 mg via INTRAVENOUS
  Administered 2020-06-12 (×2): 50 mg via INTRAVENOUS

## 2020-06-12 MED ORDER — 0.9 % SODIUM CHLORIDE (POUR BTL) OPTIME
TOPICAL | Status: DC | PRN
  Administered 2020-06-12: 08:00:00 5000 mL

## 2020-06-12 MED ORDER — PLASMA-LYTE 148 IV SOLN
INTRAVENOUS | Status: DC | PRN
  Administered 2020-06-12: 08:00:00 500 mL

## 2020-06-12 MED ORDER — FENTANYL CITRATE (PF) 250 MCG/5ML IJ SOLN
INTRAMUSCULAR | Status: AC
  Filled 2020-06-12: qty 25

## 2020-06-12 MED ORDER — MAGNESIUM SULFATE 4 GM/100ML IV SOLN
4.0000 g | Freq: Once | INTRAVENOUS | Status: AC
  Administered 2020-06-12: 14:00:00 4 g via INTRAVENOUS
  Filled 2020-06-12: qty 100

## 2020-06-12 MED ORDER — METOPROLOL TARTRATE 12.5 MG HALF TABLET
12.5000 mg | ORAL_TABLET | Freq: Two times a day (BID) | ORAL | Status: DC
  Administered 2020-06-13 – 2020-06-14 (×4): 12.5 mg via ORAL
  Filled 2020-06-12 (×4): qty 1

## 2020-06-12 MED ORDER — FAMOTIDINE IN NACL 20-0.9 MG/50ML-% IV SOLN
20.0000 mg | Freq: Two times a day (BID) | INTRAVENOUS | Status: AC
  Administered 2020-06-12 (×2): 20 mg via INTRAVENOUS
  Filled 2020-06-12 (×3): qty 50

## 2020-06-12 MED ORDER — SODIUM CHLORIDE 0.9% FLUSH
3.0000 mL | Freq: Two times a day (BID) | INTRAVENOUS | Status: DC
  Administered 2020-06-13 (×2): 3 mL via INTRAVENOUS

## 2020-06-12 MED ORDER — DOPAMINE-DEXTROSE 3.2-5 MG/ML-% IV SOLN
INTRAVENOUS | Status: DC | PRN
  Administered 2020-06-12: 2 ug/kg/min via INTRAVENOUS

## 2020-06-12 MED ORDER — MIDAZOLAM HCL (PF) 10 MG/2ML IJ SOLN
INTRAMUSCULAR | Status: AC
  Filled 2020-06-12: qty 2

## 2020-06-12 MED ORDER — CHLORHEXIDINE GLUCONATE 0.12 % MT SOLN
15.0000 mL | OROMUCOSAL | Status: AC
  Administered 2020-06-12: 14:00:00 15 mL via OROMUCOSAL

## 2020-06-12 MED ORDER — PROPOFOL 10 MG/ML IV BOLUS
INTRAVENOUS | Status: AC
  Filled 2020-06-12: qty 20

## 2020-06-12 MED ORDER — NITROGLYCERIN IN D5W 200-5 MCG/ML-% IV SOLN
0.0000 ug/min | INTRAVENOUS | Status: DC
  Administered 2020-06-12: 14:00:00 0 ug/min via INTRAVENOUS

## 2020-06-12 MED ORDER — DOPAMINE-DEXTROSE 3.2-5 MG/ML-% IV SOLN
2.0000 ug/kg/min | INTRAVENOUS | Status: DC
  Administered 2020-06-12: 2 ug/kg/min via INTRAVENOUS

## 2020-06-12 MED ORDER — BISACODYL 10 MG RE SUPP: 10.0000 mg | Freq: Every day | RECTAL | Status: DC

## 2020-06-12 MED ORDER — DEXMEDETOMIDINE HCL IN NACL 400 MCG/100ML IV SOLN
0.0000 ug/kg/h | INTRAVENOUS | Status: DC
  Administered 2020-06-12: 15:00:00 0.5 ug/kg/h via INTRAVENOUS
  Administered 2020-06-12: 14:00:00 0.7 ug/kg/h via INTRAVENOUS
  Filled 2020-06-12: qty 100

## 2020-06-12 MED ORDER — HEPARIN SODIUM (PORCINE) 1000 UNIT/ML IJ SOLN
INTRAMUSCULAR | Status: DC | PRN
  Administered 2020-06-12 (×2): 5000 [IU] via INTRAVENOUS
  Administered 2020-06-12: 28000 [IU] via INTRAVENOUS

## 2020-06-12 MED ORDER — ACETAMINOPHEN 500 MG PO TABS
1000.0000 mg | ORAL_TABLET | Freq: Four times a day (QID) | ORAL | Status: DC
  Administered 2020-06-13 (×2): 1000 mg via ORAL
  Filled 2020-06-12 (×2): qty 2

## 2020-06-12 MED ORDER — LACTATED RINGERS IV SOLN: INTRAVENOUS | Status: DC | PRN

## 2020-06-12 MED ORDER — PROTAMINE SULFATE 10 MG/ML IV SOLN
INTRAVENOUS | Status: DC | PRN
  Administered 2020-06-12: 25 mg via INTRAVENOUS
  Administered 2020-06-12: 50 mg via INTRAVENOUS
  Administered 2020-06-12: 25 mg via INTRAVENOUS
  Administered 2020-06-12 (×4): 50 mg via INTRAVENOUS

## 2020-06-12 MED ORDER — SODIUM CHLORIDE 0.9% FLUSH: 3.0000 mL | INTRAVENOUS | Status: DC | PRN

## 2020-06-12 MED ORDER — SODIUM CHLORIDE 0.9 % IV SOLN: INTRAVENOUS | Status: DC

## 2020-06-12 MED ORDER — TRAMADOL HCL 50 MG PO TABS
50.0000 mg | ORAL_TABLET | ORAL | Status: DC | PRN
  Administered 2020-06-12 – 2020-06-14 (×4): 50 mg via ORAL
  Filled 2020-06-12 (×4): qty 1

## 2020-06-12 MED ORDER — LACTATED RINGERS IV SOLN: 500.0000 mL | Freq: Once | INTRAVENOUS | Status: DC | PRN

## 2020-06-12 MED ORDER — MORPHINE SULFATE (PF) 2 MG/ML IV SOLN
1.0000 mg | INTRAVENOUS | Status: DC | PRN
Start: 2020-06-12 — End: 2020-06-14
  Administered 2020-06-12 – 2020-06-13 (×3): 2 mg via INTRAVENOUS
  Filled 2020-06-12 (×3): qty 1

## 2020-06-12 MED ORDER — PROPOFOL 10 MG/ML IV BOLUS
INTRAVENOUS | Status: DC | PRN
  Administered 2020-06-12: 80 mg via INTRAVENOUS

## 2020-06-12 MED ORDER — POTASSIUM CHLORIDE 10 MEQ/50ML IV SOLN: 10.0000 meq | INTRAVENOUS | Status: AC

## 2020-06-12 MED ORDER — THROMBIN 20000 UNITS EX SOLR
OROMUCOSAL | Status: DC | PRN
  Administered 2020-06-12 (×3): 4 mL via TOPICAL

## 2020-06-12 MED ORDER — MIDAZOLAM HCL 2 MG/2ML IJ SOLN: 2.0000 mg | INTRAMUSCULAR | Status: DC | PRN

## 2020-06-12 MED ORDER — BISACODYL 5 MG PO TBEC
10.0000 mg | DELAYED_RELEASE_TABLET | Freq: Every day | ORAL | Status: DC
  Administered 2020-06-13 – 2020-06-15 (×3): 10 mg via ORAL
  Filled 2020-06-12 (×3): qty 2

## 2020-06-12 MED ORDER — METOPROLOL TARTRATE 5 MG/5ML IV SOLN
2.5000 mg | INTRAVENOUS | Status: DC | PRN
  Administered 2020-06-13: 01:00:00 5 mg via INTRAVENOUS
  Filled 2020-06-12: qty 5

## 2020-06-12 MED ORDER — MIDAZOLAM HCL 2 MG/2ML IJ SOLN
INTRAMUSCULAR | Status: AC
  Filled 2020-06-12: qty 2

## 2020-06-12 MED ORDER — CHLORHEXIDINE GLUCONATE CLOTH 2 % EX PADS
6.0000 | MEDICATED_PAD | Freq: Every day | CUTANEOUS | Status: DC
  Administered 2020-06-14 (×2): 6 via TOPICAL

## 2020-06-12 MED ORDER — ONDANSETRON HCL 4 MG/2ML IJ SOLN
4.0000 mg | Freq: Four times a day (QID) | INTRAMUSCULAR | Status: DC | PRN
  Administered 2020-06-12: 20:00:00 4 mg via INTRAVENOUS

## 2020-06-12 MED ORDER — THROMBIN (RECOMBINANT) 20000 UNITS EX SOLR
CUTANEOUS | Status: AC
  Filled 2020-06-12: qty 20000

## 2020-06-12 MED ORDER — ALBUMIN HUMAN 5 % IV SOLN: INTRAVENOUS | Status: DC | PRN

## 2020-06-12 MED ORDER — THROMBIN 20000 UNITS EX SOLR
CUTANEOUS | Status: DC | PRN
  Administered 2020-06-12: 08:00:00 20000 [IU] via TOPICAL

## 2020-06-12 MED ORDER — ASPIRIN 81 MG PO CHEW: 324.0000 mg | CHEWABLE_TABLET | Freq: Every day | ORAL | Status: DC

## 2020-06-12 MED ORDER — MIDAZOLAM HCL 5 MG/5ML IJ SOLN
INTRAMUSCULAR | Status: DC | PRN
  Administered 2020-06-12 (×5): 2 mg via INTRAVENOUS
  Administered 2020-06-12: .5 mg via INTRAVENOUS
  Administered 2020-06-12: 1.5 mg via INTRAVENOUS

## 2020-06-12 MED FILL — Magnesium Sulfate Inj 50%: INTRAMUSCULAR | Qty: 10 | Status: AC

## 2020-06-12 MED FILL — Potassium Chloride Inj 2 mEq/ML: INTRAVENOUS | Qty: 40 | Status: AC

## 2020-06-12 MED FILL — Heparin Sodium (Porcine) Inj 1000 Unit/ML: INTRAMUSCULAR | Qty: 30 | Status: AC

## 2020-06-12 SURGICAL SUPPLY — 107 items
BAG DECANTER FOR FLEXI CONT (MISCELLANEOUS) ×4 IMPLANT
BLADE CLIPPER SURG (BLADE) IMPLANT
BLADE STERNUM SYSTEM 6 (BLADE) ×4 IMPLANT
BNDG ELASTIC 4X5.8 VLCR STR LF (GAUZE/BANDAGES/DRESSINGS) ×4 IMPLANT
BNDG ELASTIC 6X5.8 VLCR STR LF (GAUZE/BANDAGES/DRESSINGS) ×4 IMPLANT
BNDG GAUZE ELAST 4 BULKY (GAUZE/BANDAGES/DRESSINGS) ×4 IMPLANT
CANISTER SUCT 3000ML PPV (MISCELLANEOUS) ×4 IMPLANT
CATH ROBINSON RED A/P 18FR (CATHETERS) ×8 IMPLANT
CATH THORACIC 28FR (CATHETERS) ×4 IMPLANT
CATH THORACIC 36FR (CATHETERS) ×4 IMPLANT
CATH THORACIC 36FR RT ANG (CATHETERS) ×4 IMPLANT
CLIP VESOCCLUDE MED 24/CT (CLIP) IMPLANT
CLIP VESOCCLUDE SM WIDE 24/CT (CLIP) ×8 IMPLANT
DERMABOND ADVANCED (GAUZE/BANDAGES/DRESSINGS) ×2
DERMABOND ADVANCED .7 DNX12 (GAUZE/BANDAGES/DRESSINGS) ×2 IMPLANT
DRAPE CARDIOVASCULAR INCISE (DRAPES) ×2
DRAPE SLUSH/WARMER DISC (DRAPES) ×4 IMPLANT
DRAPE SRG 135X102X78XABS (DRAPES) ×2 IMPLANT
DRSG COVADERM 4X14 (GAUZE/BANDAGES/DRESSINGS) ×4 IMPLANT
ELECT CAUTERY BLADE 6.4 (BLADE) ×4 IMPLANT
ELECT REM PT RETURN 9FT ADLT (ELECTROSURGICAL) ×8
ELECTRODE REM PT RTRN 9FT ADLT (ELECTROSURGICAL) ×4 IMPLANT
FELT TEFLON 1X6 (MISCELLANEOUS) ×4 IMPLANT
GAUZE SPONGE 4X4 12PLY STRL (GAUZE/BANDAGES/DRESSINGS) ×8 IMPLANT
GAUZE SPONGE 4X4 12PLY STRL LF (GAUZE/BANDAGES/DRESSINGS) ×8 IMPLANT
GLOVE BIO SURGEON STRL SZ 6 (GLOVE) ×4 IMPLANT
GLOVE BIO SURGEON STRL SZ 6.5 (GLOVE) ×6 IMPLANT
GLOVE BIO SURGEON STRL SZ7 (GLOVE) IMPLANT
GLOVE BIO SURGEON STRL SZ7.5 (GLOVE) IMPLANT
GLOVE BIO SURGEONS STRL SZ 6.5 (GLOVE) ×2
GLOVE BIOGEL PI IND STRL 6 (GLOVE) IMPLANT
GLOVE BIOGEL PI IND STRL 6.5 (GLOVE) IMPLANT
GLOVE BIOGEL PI IND STRL 7.0 (GLOVE) IMPLANT
GLOVE BIOGEL PI IND STRL 7.5 (GLOVE) ×2 IMPLANT
GLOVE BIOGEL PI INDICATOR 6 (GLOVE)
GLOVE BIOGEL PI INDICATOR 6.5 (GLOVE)
GLOVE BIOGEL PI INDICATOR 7.0 (GLOVE)
GLOVE BIOGEL PI INDICATOR 7.5 (GLOVE) ×2
GLOVE ORTHO TXT STRL SZ7.5 (GLOVE) IMPLANT
GLOVE SURG SS PI 6.0 STRL IVOR (GLOVE) ×4 IMPLANT
GLOVE SURG SS PI 7.5 STRL IVOR (GLOVE) ×8 IMPLANT
GLOVE SURG UNDER POLY LF SZ6 (GLOVE) ×12 IMPLANT
GLOVE SURG UNDER POLY LF SZ6.5 (GLOVE) ×12 IMPLANT
GLOVE TRIUMPH SURG SIZE 7.0 (KITS) ×8 IMPLANT
GOWN STRL REUS W/ TWL LRG LVL3 (GOWN DISPOSABLE) ×14 IMPLANT
GOWN STRL REUS W/ TWL XL LVL3 (GOWN DISPOSABLE) ×2 IMPLANT
GOWN STRL REUS W/TWL LRG LVL3 (GOWN DISPOSABLE) ×14
GOWN STRL REUS W/TWL XL LVL3 (GOWN DISPOSABLE) ×2
HEMOSTAT POWDER SURGIFOAM 1G (HEMOSTASIS) ×12 IMPLANT
HEMOSTAT SURGICEL 2X14 (HEMOSTASIS) ×4 IMPLANT
INSERT FOGARTY 61MM (MISCELLANEOUS) IMPLANT
INSERT FOGARTY XLG (MISCELLANEOUS) IMPLANT
KIT BASIN OR (CUSTOM PROCEDURE TRAY) ×4 IMPLANT
KIT CATH CPB BARTLE (MISCELLANEOUS) ×4 IMPLANT
KIT SUCTION CATH 14FR (SUCTIONS) ×4 IMPLANT
KIT TURNOVER KIT B (KITS) ×4 IMPLANT
KIT VASOVIEW HEMOPRO 2 VH 4000 (KITS) ×4 IMPLANT
NS IRRIG 1000ML POUR BTL (IV SOLUTION) ×20 IMPLANT
PACK E OPEN HEART (SUTURE) ×4 IMPLANT
PACK OPEN HEART (CUSTOM PROCEDURE TRAY) ×4 IMPLANT
PAD ARMBOARD 7.5X6 YLW CONV (MISCELLANEOUS) ×8 IMPLANT
PAD ELECT DEFIB RADIOL ZOLL (MISCELLANEOUS) ×4 IMPLANT
PENCIL BUTTON HOLSTER BLD 10FT (ELECTRODE) ×4 IMPLANT
POSITIONER HEAD DONUT 9IN (MISCELLANEOUS) ×4 IMPLANT
PUNCH AORTIC ROTATE 4.0MM (MISCELLANEOUS) IMPLANT
PUNCH AORTIC ROTATE 4.5MM 8IN (MISCELLANEOUS) ×4 IMPLANT
PUNCH AORTIC ROTATE 5MM 8IN (MISCELLANEOUS) IMPLANT
SET CARDIOPLEGIA MPS 5001102 (MISCELLANEOUS) ×4 IMPLANT
SPONGE INTESTINAL PEANUT (DISPOSABLE) IMPLANT
SPONGE LAP 18X18 RF (DISPOSABLE) IMPLANT
SPONGE LAP 4X18 RFD (DISPOSABLE) ×4 IMPLANT
SUPPORT HEART JANKE-BARRON (MISCELLANEOUS) ×4 IMPLANT
SUT BONE WAX W31G (SUTURE) ×4 IMPLANT
SUT MNCRL AB 4-0 PS2 18 (SUTURE) ×4 IMPLANT
SUT PROLENE 3 0 SH DA (SUTURE) ×4 IMPLANT
SUT PROLENE 3 0 SH1 36 (SUTURE) ×4 IMPLANT
SUT PROLENE 4 0 RB 1 (SUTURE)
SUT PROLENE 4 0 SH DA (SUTURE) IMPLANT
SUT PROLENE 4-0 RB1 .5 CRCL 36 (SUTURE) IMPLANT
SUT PROLENE 5 0 C 1 36 (SUTURE) IMPLANT
SUT PROLENE 6 0 C 1 30 (SUTURE) ×8 IMPLANT
SUT PROLENE 7 0 BV 1 (SUTURE) IMPLANT
SUT PROLENE 7 0 BV1 MDA (SUTURE) ×8 IMPLANT
SUT PROLENE 8 0 BV175 6 (SUTURE) IMPLANT
SUT SILK  1 MH (SUTURE)
SUT SILK 1 MH (SUTURE) IMPLANT
SUT SILK 2 0 SH (SUTURE) IMPLANT
SUT SILK 2 0 SH CR/8 (SUTURE) ×4 IMPLANT
SUT STEEL 6MS V (SUTURE) ×8 IMPLANT
SUT VIC AB 1 CTX 36 (SUTURE) ×6
SUT VIC AB 1 CTX36XBRD ANBCTR (SUTURE) ×6 IMPLANT
SUT VIC AB 2-0 CT1 27 (SUTURE) ×2
SUT VIC AB 2-0 CT1 TAPERPNT 27 (SUTURE) ×2 IMPLANT
SUT VIC AB 2-0 CTX 27 (SUTURE) IMPLANT
SUT VIC AB 3-0 SH 27 (SUTURE)
SUT VIC AB 3-0 SH 27X BRD (SUTURE) IMPLANT
SUT VIC AB 3-0 X1 27 (SUTURE) IMPLANT
SUT VICRYL 4-0 PS2 18IN ABS (SUTURE) IMPLANT
SYSTEM SAHARA CHEST DRAIN ATS (WOUND CARE) ×4 IMPLANT
TAPE CLOTH SURG 4X10 WHT LF (GAUZE/BANDAGES/DRESSINGS) ×4 IMPLANT
TAPE PAPER 2X10 WHT MICROPORE (GAUZE/BANDAGES/DRESSINGS) ×4 IMPLANT
TOWEL GREEN STERILE (TOWEL DISPOSABLE) ×4 IMPLANT
TOWEL GREEN STERILE FF (TOWEL DISPOSABLE) ×4 IMPLANT
TRAY FOLEY SLVR 16FR TEMP STAT (SET/KITS/TRAYS/PACK) ×4 IMPLANT
TUBING LAP HI FLOW INSUFFLATIO (TUBING) ×4 IMPLANT
UNDERPAD 30X36 HEAVY ABSORB (UNDERPADS AND DIAPERS) ×4 IMPLANT
WATER STERILE IRR 1000ML POUR (IV SOLUTION) ×8 IMPLANT

## 2020-06-12 NOTE — Progress Notes (Signed)
Patients blood pressure elevated despite titration of phenylephrine. Phenylephrine titrated off and Nitroglycerin started per MD orders and parameters.

## 2020-06-12 NOTE — Brief Op Note (Signed)
06/09/2020 - 06/12/2020  11:46 AM  PATIENT:  Mortimer Fries R Piche  79 y.o. male  PRE-OPERATIVE DIAGNOSIS:  CORONARY ARTERY DISEASE  POST-OPERATIVE DIAGNOSIS:  CORONARY ARTERY DISEASE  PROCEDURE:  CORONARY ARTERY BYPASS GRAFTING (CABG)   LIMA->LAD  SVG->Intermediate  SVG->OM2->OM3  SVG->LPDA  Vein harvest time: 55min Vein prep time: 15min  TRANSESOPHAGEAL ECHOCARDIOGRAM (TEE)   SURGEON: Gaye Pollack, MD - Primary  PHYSICIAN ASSISTANT: Vikas Wegmann   ANESTHESIA:   general  EBL:  Per perfusion and anesthesia records   BLOOD ADMINISTERED:none  DRAINS: Mediastinal and left pleural tubes   LOCAL MEDICATIONS USED:  NONE  SPECIMEN:  No Specimen  DISPOSITION OF SPECIMEN:  N/A  COUNTS:  YES  DICTATION: .Dragon Dictation  PLAN OF CARE: Admit to inpatient   PATIENT DISPOSITION:  ICU - intubated and hemodynamically stable.   Delay start of Pharmacological VTE agent (>24hrs) due to surgical blood loss or risk of bleeding: yes

## 2020-06-12 NOTE — Progress Notes (Signed)
TCTS:  I have reviewed Carl Young chart, cath films and interviewed and examined him. He has severe 3 vessel coronary artery disease with progressive ischemic symptoms of chest tightness, fatigue and shortness of breath with activity. I agree with need for CABG. His preop echo has suboptimal images but the aortic valve is thickened with some restriction of mobility. The mean gradient is 11 mm Hg consistent with mild AS. We will evaluate further in the OR with TEE. If it is really mild I would leave the aortic valve alone given his age and stage 3 CKD with creat 1.47 in order to shorten pump run and impact on kidneys. I discussed surgery, alternatives, benefits and risks with him and he agrees to proceed.

## 2020-06-12 NOTE — Anesthesia Procedure Notes (Signed)
Central Venous Catheter Insertion Performed by: Catalina Gravel, MD, anesthesiologist Start/End12/13/2021 6:45 AM, 06/12/2020 6:25 AM Patient location: Pre-op. Preanesthetic checklist: patient identified, IV checked, site marked, risks and benefits discussed, surgical consent, monitors and equipment checked, pre-op evaluation, timeout performed and anesthesia consent Position: Trendelenburg Lidocaine 1% used for infiltration and patient sedated Hand hygiene performed , maximum sterile barriers used  and Seldinger technique used Catheter size: 9 Fr Central line was placed.MAC introducer Procedure performed using ultrasound guided technique. Ultrasound Notes:anatomy identified, needle tip was noted to be adjacent to the nerve/plexus identified, no ultrasound evidence of intravascular and/or intraneural injection and image(s) printed for medical record Attempts: 1 Following insertion, line sutured, dressing applied and Biopatch. Post procedure assessment: free fluid flow, blood return through all ports and no air  Patient tolerated the procedure well with no immediate complications.

## 2020-06-12 NOTE — Anesthesia Procedure Notes (Signed)
Central Venous Catheter Insertion Performed by: Catalina Gravel, MD, anesthesiologist Start/End12/13/2021 6:55 AM, 06/12/2020 7:00 AM Patient location: Pre-op. Preanesthetic checklist: patient identified, IV checked, site marked, risks and benefits discussed, surgical consent, monitors and equipment checked, pre-op evaluation and timeout performed Position: Trendelenburg Hand hygiene performed  and maximum sterile barriers used  Total catheter length 100. PA cath was placed.Swan type:thermodilution PA Cath depth:60 Procedure performed without using ultrasound guided technique. Attempts: 1 Patient tolerated the procedure well with no immediate complications.

## 2020-06-12 NOTE — Op Note (Signed)
CARDIOVASCULAR SURGERY OPERATIVE NOTE  06/12/2020  Surgeon:  Gaye Pollack, MD  First Assistant: Enid Cutter,  PA-C   Preoperative Diagnosis:  Severe multi-vessel coronary artery disease   Postoperative Diagnosis:  Same   Procedure:  1. Median Sternotomy 2. Extracorporeal circulation 3.   Coronary artery bypass grafting x 5   Left internal mammary artery graft to the LAD  SVG to Ramus  Sequential SVG to OM2 and OM3  SVG to PDA (LCX) 4.   Endoscopic vein harvest from the right leg   Anesthesia:  General Endotracheal   Clinical History/Surgical Indication:  This 79 year old gentleman presented with a hx of tiredness, fatigue with exertion and indigestion-like chest discomfort. He had an intermediate risk nuclear stress test showing a large inferior/inferoseptal/septal/apical MI with moderate peri-infarct ischemia. EF was 55-65%. Cath showed severe 3 vessel CAD. It was felt that CABG was the best treatment for him. Preop 2D echo showed mild AS with a mean gradient of 11 mm Hg and a calcified, thickened valve. I told him that I would evaluate his valve in the OR with TEE before making a decision to replace it or continue observation. I discussed the operative procedure with the patient including alternatives, benefits and risks; including but not limited to bleeding, blood transfusion, infection, stroke, myocardial infarction, graft failure, heart block requiring a permanent pacemaker, organ dysfunction, and death.  Carl Young understands and agrees to proceed.    Preparation:  The patient was seen in the preoperative holding area and the correct patient, correct operation were confirmed with the patient after reviewing the medical record and catheterization. The consent was signed by me. Preoperative antibiotics were given. A pulmonary arterial line and radial arterial line  were placed by the anesthesia team. The patient was taken back to the operating room and positioned supine on the operating room table. After being placed under general endotracheal anesthesia by the anesthesia team a foley catheter was placed. The neck, chest, abdomen, and both legs were prepped with betadine soap and solution and draped in the usual sterile manner. A surgical time-out was taken and the correct patient and operative procedure were confirmed with the nursing and anesthesia staff.  TEE: performed by Dr. Gifford Shave. This showed a calcified aortic valve with mild AS with a mean gradient of 9 mm Hg. Normal LV systolic function.  I did not think that AVR was necessary at this time and would have significantly prolonged the pump time, increasing the risk of worsening his chronic kidney disease. This will need continued followup with periodic echocardiogram.   Cardiopulmonary Bypass:  A median sternotomy was performed. The pericardium was opened in the midline. Right ventricular function appeared normal. The ascending aorta was of normal size and had no palpable plaque. There were no contraindications to aortic cannulation or cross-clamping. The patient was fully systemically heparinized and the ACT was maintained > 400 sec. The proximal aortic arch was cannulated with a 20 F aortic cannula for arterial inflow. Venous cannulation was performed via the right atrial appendage using a two-staged venous cannula. An antegrade cardioplegia/vent cannula was inserted into the mid-ascending aorta. Aortic occlusion was performed with a single cross-clamp. Systemic cooling to 32 degrees Centigrade and topical cooling of the heart with iced saline were used. Hyperkalemic antegrade cold blood cardioplegia was used to induce diastolic arrest and was then given at about 20 minute intervals throughout the period of arrest to maintain myocardial temperature at or below 10 degrees centigrade. A temperature  probe was  inserted into the interventricular septum and an insulating pad was placed in the pericardium.   Left internal mammary artery harvest:  The left side of the sternum was retracted using the Rultract retractor. The left internal mammary artery was harvested as a pedicle graft. All side branches were clipped. It was a small-sized vessel of good quality with good blood flow. It was ligated distally and divided. It was sprayed with topical papaverine solution to prevent vasospasm.   Endoscopic vein harvest:  The right greater saphenous vein was harvested endoscopically through a 2 cm incision medial to the right knee. It was harvested from the upper thigh to below the knee. It was a medium-sized vein of good quality. The side branches were all ligated with 4-0 silk ties.    Coronary arteries:  The coronary arteries were examined.   LAD:  Medium caliber vessel with mild distal disease.  LCX:  Medium caliber Ramus with no distal disease. The OM1 and OM4 were large vessels with no distal disease. OM2 and OM3 were medium caliber vessels with no distal disease. The LCX terminated as a medium caliber PDA branch.  RCA:  Small and non-dominant with an acute marginal branch that was deep in the epicardial fat.    Grafts:  1. LIMA to the LAD: 1.75 mm. It was sewn end to side using 8-0 prolene continuous suture. 2. SVG to Ramus:  1.75 mm. It was sewn end to side using 7-0 prolene continuous suture. 3. Sequential SVG to OM2:  1.6 mm. It was sewn sequential side to side using 7-0 prolene continuous suture. 4.   Sequential SVG to OM3:  1.6 mm. It was sewn sequential end to side using 7-0 prolene           continuous suture. 5.   SVG to PDA (LCX):  1.6 mm. It was sewn end to side using 7-0 prolene continuous       suture.  The proximal vein graft anastomoses were performed to the mid-ascending aorta using continuous 6-0 prolene suture. Graft markers were placed around the proximal  anastomoses.   Completion:  The patient was rewarmed to 37 degrees Centigrade. The clamp was removed from the LIMA pedicle and there was rapid warming of the septum and return of ventricular fibrillation. The crossclamp was removed with a time of 96 minutes. There was spontaneous return of sinus rhythm. The distal and proximal anastomoses were checked for hemostasis. The position of the grafts was satisfactory. Two temporary epicardial pacing wires were placed on the right atrium and two on the right ventricle. The patient was weaned from CPB without difficulty on no inotropes. CPB time was 118 minutes. Cardiac output was 5 LPM. TEE showed normal LV systolic function. Heparin was fully reversed with protamine and the aortic and venous cannulas removed. Hemostasis was achieved. Mediastinal and left pleural drainage tubes were placed. The sternum was closed with  #6 stainless steel wires. The fascia was closed with continuous # 1 vicryl suture. The subcutaneous tissue was closed with 2-0 vicryl continuous suture. The skin was closed with 3-0 vicryl subcuticular suture. All sponge, needle, and instrument counts were reported correct at the end of the case. Dry sterile dressings were placed over the incisions and around the chest tubes which were connected to pleurevac suction. The patient was then transported to the surgical intensive care unit in stable condition.

## 2020-06-12 NOTE — Progress Notes (Signed)
  Echocardiogram Echocardiogram Transesophageal has been performed.  Darlina Sicilian M 06/12/2020, 7:59 AM

## 2020-06-12 NOTE — Procedures (Signed)
Extubation Procedure Note  Patient Details:   Name: Carl Young DOB: 02/16/1941 MRN: [id number]   Airway Documentation:    Vent end date: (not recorded) Vent end time: (not recorded)   Evaluation  O2 sats: stable throughout Complications: No apparent complications Patient did tolerate procedure well. Bilateral Breath Sounds: Clear,Diminished   Yes  4l/min [place] NIF-30 FVC-742ml Incentive spirometer performed 500-729ml   Revonda Standard 06/12/2020, 6:32 PM

## 2020-06-12 NOTE — Transfer of Care (Signed)
Immediate Anesthesia Transfer of Care Note  Patient: Carl Young  Procedure(s) Performed: CORONARY ARTERY BYPASS GRAFTING (CABG), ON PUMP, TIMES FIVE, USING LEFT INTERNAL MAMMARY ARTERY AND ENDOSCOPICALLY HARVESTED RIGHT GREATER SAPHENOUS VEIN (N/A Chest) TRANSESOPHAGEAL ECHOCARDIOGRAM (TEE) (N/A )  Patient Location: SICU  Anesthesia Type:General  Level of Consciousness: Patient remains intubated per anesthesia plan  Airway & Oxygen Therapy: Patient remains intubated per anesthesia plan and Patient placed on Ventilator (see vital sign flow sheet for setting)  Post-op Assessment: Post -op Vital signs reviewed and stable  Post vital signs: stable  Last Vitals:  Vitals Value Taken Time  BP    Temp    Pulse    Resp    SpO2      Last Pain:  Vitals:   06/12/20 0450  TempSrc: Oral  PainSc:       Patients Stated Pain Goal: 0 (04/07/11 1975)  Complications: No complications documented.

## 2020-06-12 NOTE — Anesthesia Procedure Notes (Signed)
Arterial Line Insertion Start/End12/13/2021 7:10 AM, 06/12/2020 7:15 AM Performed by: Lavell Luster, CRNA  Preanesthetic checklist: patient identified, IV checked, risks and benefits discussed, surgical consent, monitors and equipment checked, pre-op evaluation and timeout performed Lidocaine 1% used for infiltration and patient sedated Left, radial was placed Catheter size: 20 G Hand hygiene performed , maximum sterile barriers used  and Seldinger technique used Allen's test indicative of satisfactory collateral circulation Attempts: 1 Procedure performed without using ultrasound guided technique. Following insertion, Biopatch and dressing applied. Post procedure assessment: normal  Patient tolerated the procedure well with no immediate complications.

## 2020-06-12 NOTE — Anesthesia Postprocedure Evaluation (Signed)
Anesthesia Post Note  Patient: Carl Young  Procedure(s) Performed: CORONARY ARTERY BYPASS GRAFTING (CABG), ON PUMP, TIMES FIVE, USING LEFT INTERNAL MAMMARY ARTERY AND ENDOSCOPICALLY HARVESTED RIGHT GREATER SAPHENOUS VEIN (N/A Chest) TRANSESOPHAGEAL ECHOCARDIOGRAM (TEE) (N/A )     Patient location during evaluation: SICU Anesthesia Type: General Level of consciousness: sedated Pain management: pain level controlled Vital Signs Assessment: post-procedure vital signs reviewed and stable Respiratory status: patient remains intubated per anesthesia plan Cardiovascular status: stable Postop Assessment: no apparent nausea or vomiting Anesthetic complications: no   No complications documented.  Last Vitals:  Vitals:   06/12/20 1645 06/12/20 1700  BP:    Pulse: 80 80  Resp: 15 16  Temp: 37.6 C 37.7 C  SpO2: 100% 100%    Last Pain:  Vitals:   06/12/20 0450  TempSrc: Oral  PainSc:                  Catalina Gravel

## 2020-06-12 NOTE — Anesthesia Procedure Notes (Signed)
Procedure Name: Intubation Date/Time: 06/12/2020 7:36 AM Performed by: Lavell Luster, CRNA Pre-anesthesia Checklist: Patient identified, Emergency Drugs available, Suction available, Patient being monitored and Timeout performed Patient Re-evaluated:Patient Re-evaluated prior to induction Oxygen Delivery Method: Circle system utilized Preoxygenation: Pre-oxygenation with 100% oxygen Induction Type: IV induction Ventilation: Mask ventilation without difficulty Laryngoscope Size: Mac and 4 Grade View: Grade I Tube type: Oral Tube size: 7.5 mm Number of attempts: 1 Airway Equipment and Method: Stylet Placement Confirmation: ETT inserted through vocal cords under direct vision,  positive ETCO2 and breath sounds checked- equal and bilateral Secured at: 21 cm Tube secured with: Tape Dental Injury: Teeth and Oropharynx as per pre-operative assessment

## 2020-06-12 NOTE — Progress Notes (Signed)
Patient ID: Carl Young, male   DOB: [birth year], 79 y.o.   MRN: [id number]  TCTS Evening Rounds:   Hemodynamically stable  CI = 1.9  Extubated.  Urine output good  CT output low  CBC    Component Value Date/Time   WBC 9.5 06/12/2020 1334   RBC 3.47 (L) 06/12/2020 1334   HGB 11.2 (L) 06/12/2020 1822   HCT 33.0 (L) 06/12/2020 1822   PLT 106 (L) 06/12/2020 1334   MCV 94.2 06/12/2020 1334   MCH 32.0 06/12/2020 1334   MCHC 33.9 06/12/2020 1334   RDW 13.7 06/12/2020 1334   LYMPHSABS 1.2 03/24/2020 1520   MONOABS 0.6 03/24/2020 1520   EOSABS 0.0 03/24/2020 1520   BASOSABS 0.0 03/24/2020 1520     BMET    Component Value Date/Time   NA 137 06/12/2020 1822   K 4.6 06/12/2020 1822   CL 101 06/12/2020 1228   CO2 26 06/12/2020 0621   GLUCOSE 128 (H) 06/12/2020 1228   BUN 22 06/12/2020 1228   CREATININE 1.20 06/12/2020 1228   CALCIUM 9.5 06/12/2020 0621   GFRNONAA 52 (L) 06/12/2020 0621   GFRAA 43 (L) 03/24/2020 1520     A/P:  Stable postop course. Continue current plans

## 2020-06-13 ENCOUNTER — Encounter (HOSPITAL_COMMUNITY): Payer: Self-pay | Admitting: Surgery

## 2020-06-13 ENCOUNTER — Inpatient Hospital Stay (HOSPITAL_COMMUNITY): Payer: Medicare Other

## 2020-06-13 LAB — CBC
HCT: 34.1 % — ABNORMAL LOW (ref 39.0–52.0)
HCT: 30.6 % — ABNORMAL LOW (ref 39.0–52.0)
Hemoglobin: 11.7 g/dL — ABNORMAL LOW (ref 13.0–17.0)
Hemoglobin: 10.3 g/dL — ABNORMAL LOW (ref 13.0–17.0)
MCH: 33.1 pg (ref 26.0–34.0)
MCH: 32.9 pg (ref 26.0–34.0)
MCHC: 34.3 g/dL (ref 30.0–36.0)
MCHC: 33.7 g/dL (ref 30.0–36.0)
MCV: 96.6 fL (ref 80.0–100.0)
MCV: 97.8 fL (ref 80.0–100.0)
Platelets: 150 10*3/uL (ref 150–400)
Platelets: 126 10*3/uL — ABNORMAL LOW (ref 150–400)
RBC: 3.53 MIL/uL — ABNORMAL LOW (ref 4.22–5.81)
RBC: 3.13 MIL/uL — ABNORMAL LOW (ref 4.22–5.81)
RDW: 14.3 % (ref 11.5–15.5)
RDW: 14.5 % (ref 11.5–15.5)
WBC: 13.5 10*3/uL — ABNORMAL HIGH (ref 4.0–10.5)
WBC: 11.7 10*3/uL — ABNORMAL HIGH (ref 4.0–10.5)
nRBC: 0 % (ref 0.0–0.2)
nRBC: 0 % (ref 0.0–0.2)

## 2020-06-13 LAB — BASIC METABOLIC PANEL
Anion gap: 10 (ref 5–15)
Anion gap: 10 (ref 5–15)
BUN: 20 mg/dL (ref 8–23)
BUN: 23 mg/dL (ref 8–23)
CO2: 24 mmol/L (ref 22–32)
CO2: 24 mmol/L (ref 22–32)
Calcium: 8.2 mg/dL — ABNORMAL LOW (ref 8.9–10.3)
Calcium: 8.1 mg/dL — ABNORMAL LOW (ref 8.9–10.3)
Chloride: 105 mmol/L (ref 98–111)
Chloride: 103 mmol/L (ref 98–111)
Creatinine, Ser: 1.59 mg/dL — ABNORMAL HIGH (ref 0.61–1.24)
Creatinine, Ser: 1.44 mg/dL — ABNORMAL HIGH (ref 0.61–1.24)
GFR, Estimated: 43 mL/min — ABNORMAL LOW (ref >60)
GFR, Estimated: 49 mL/min — ABNORMAL LOW (ref >60)
Glucose, Bld: 129 mg/dL — ABNORMAL HIGH (ref 70–99)
Glucose, Bld: 142 mg/dL — ABNORMAL HIGH (ref 70–99)
Potassium: 4 mmol/L (ref 3.5–5.1)
Potassium: 3.9 mmol/L (ref 3.5–5.1)
Sodium: 139 mmol/L (ref 135–145)
Sodium: 137 mmol/L (ref 135–145)

## 2020-06-13 LAB — GLUCOSE, CAPILLARY
Glucose-Capillary: 112 mg/dL — ABNORMAL HIGH (ref 70–99)
Glucose-Capillary: 125 mg/dL — ABNORMAL HIGH (ref 70–99)
Glucose-Capillary: 136 mg/dL — ABNORMAL HIGH (ref 70–99)
Glucose-Capillary: 128 mg/dL — ABNORMAL HIGH (ref 70–99)
Glucose-Capillary: 129 mg/dL — ABNORMAL HIGH (ref 70–99)
Glucose-Capillary: 172 mg/dL — ABNORMAL HIGH (ref 70–99)
Glucose-Capillary: 111 mg/dL — ABNORMAL HIGH (ref 70–99)
Glucose-Capillary: 124 mg/dL — ABNORMAL HIGH (ref 70–99)
Glucose-Capillary: 111 mg/dL — ABNORMAL HIGH (ref 70–99)
Glucose-Capillary: 97 mg/dL (ref 70–99)

## 2020-06-13 LAB — MAGNESIUM
Magnesium: 2.7 mg/dL — ABNORMAL HIGH (ref 1.7–2.4)
Magnesium: 2.3 mg/dL (ref 1.7–2.4)

## 2020-06-13 MED ORDER — ORAL CARE MOUTH RINSE
15.0000 mL | Freq: Two times a day (BID) | OROMUCOSAL | Status: DC
  Administered 2020-06-13 – 2020-06-17 (×6): 15 mL via OROMUCOSAL

## 2020-06-13 MED ORDER — ENOXAPARIN SODIUM 40 MG/0.4ML ~~LOC~~ SOLN
40.0000 mg | Freq: Every day | SUBCUTANEOUS | Status: DC
  Administered 2020-06-13 – 2020-06-15 (×3): 40 mg via SUBCUTANEOUS
  Filled 2020-06-13 (×3): qty 0.4

## 2020-06-13 MED ORDER — PHENOL 1.4 % MT LIQD
1.0000 | OROMUCOSAL | Status: DC | PRN
  Administered 2020-06-13: 1 via OROMUCOSAL
  Filled 2020-06-13: qty 177

## 2020-06-13 MED ORDER — SODIUM CHLORIDE 0.9% FLUSH
10.0000 mL | INTRAVENOUS | Status: DC | PRN
Start: 2020-06-13 — End: 2020-06-18

## 2020-06-13 MED ORDER — INSULIN ASPART 100 UNIT/ML ~~LOC~~ SOLN
0.0000 [IU] | Freq: Three times a day (TID) | SUBCUTANEOUS | Status: DC
  Administered 2020-06-17: 18:00:00 2 [IU] via SUBCUTANEOUS

## 2020-06-13 MED ORDER — INSULIN DETEMIR 100 UNIT/ML ~~LOC~~ SOLN
20.0000 [IU] | Freq: Every day | SUBCUTANEOUS | Status: DC
  Administered 2020-06-13 – 2020-06-15 (×3): 20 [IU] via SUBCUTANEOUS
  Filled 2020-06-13 (×4): qty 0.2

## 2020-06-13 MED ORDER — SODIUM CHLORIDE 0.9% FLUSH
10.0000 mL | Freq: Two times a day (BID) | INTRAVENOUS | Status: DC
  Administered 2020-06-13 – 2020-06-18 (×3): 10 mL

## 2020-06-13 MED ORDER — ACETAMINOPHEN 500 MG PO TABS
1000.0000 mg | ORAL_TABLET | Freq: Four times a day (QID) | ORAL | Status: DC
  Administered 2020-06-13 – 2020-06-18 (×14): 1000 mg via ORAL
  Filled 2020-06-13 (×16): qty 2

## 2020-06-13 MED ORDER — INSULIN ASPART 100 UNIT/ML ~~LOC~~ SOLN: 0.0000 [IU] | SUBCUTANEOUS | Status: DC

## 2020-06-13 MED FILL — Sodium Bicarbonate IV Soln 8.4%: INTRAVENOUS | Qty: 50 | Status: AC

## 2020-06-13 MED FILL — Thrombin (Recombinant) For Soln 20000 Unit: CUTANEOUS | Qty: 1 | Status: AC

## 2020-06-13 MED FILL — Mannitol IV Soln 20%: INTRAVENOUS | Qty: 500 | Status: AC

## 2020-06-13 MED FILL — Lidocaine HCl Local Soln Prefilled Syringe 100 MG/5ML (2%): INTRAMUSCULAR | Qty: 5 | Status: AC

## 2020-06-13 MED FILL — Electrolyte-R (PH 7.4) Solution: INTRAVENOUS | Qty: 4000 | Status: AC

## 2020-06-13 MED FILL — Sodium Chloride IV Soln 0.9%: INTRAVENOUS | Qty: 2000 | Status: AC

## 2020-06-13 MED FILL — Heparin Sodium (Porcine) Inj 1000 Unit/ML: INTRAMUSCULAR | Qty: 30 | Status: AC

## 2020-06-13 NOTE — Progress Notes (Signed)
TCTS Evening Rounds  POD #1 s/p CABG C/o hoarseness but not dysphagia Remains on 2 mcg DA  BP (!) 110/59   Pulse 76   Temp 98.7 F (37.1 C) (Oral)   Resp 18   Ht 6' (1.829 m)   Wt 89.8 kg   SpO2 100%   BMI 26.85 kg/m  A/o CTA RRR   Intake/Output Summary (Last 24 hours) at 06/13/2020 1643 Last data filed at 06/13/2020 1500 Gross per 24 hour  Intake 1583.16 ml  Output 2270 ml  Net -686.84 ml    A/P: continue present management  Jacoya Bauman Z. Orvan Seen, [place]

## 2020-06-13 NOTE — Progress Notes (Signed)
Despite titration of nitroglycerin, aline blood pressure remains elevated. Noninvasive blood pressure obtained 132/72. Lopressor 5mg  IV given per MD PRN orders, with no change noted in aline blood pressure. HR Once again required pacing after admin of lopressor.   After Lopressor Mg IV noninvasive bp 120/48, aline sbp remains >150 will follow noninvasive.

## 2020-06-13 NOTE — Discharge Instructions (Signed)

## 2020-06-13 NOTE — Progress Notes (Signed)
1 Day Post-Op Procedure(s) (LRB): CORONARY ARTERY BYPASS GRAFTING (CABG), ON PUMP, TIMES FIVE, USING LEFT INTERNAL MAMMARY ARTERY AND ENDOSCOPICALLY HARVESTED RIGHT GREATER SAPHENOUS VEIN (N/A) TRANSESOPHAGEAL ECHOCARDIOGRAM (TEE) (N/A) Subjective: Complains of pain but overall feels ok  Objective: Vital signs in last 24 hours: Temp:  [98.6 F (37 C)-100.8 F (38.2 C)] 99.14 F (37.3 C) (12/14 0700) Pulse Rate:  [40-112] 80 (12/14 0700) Cardiac Rhythm: Atrial paced (12/13 2000) Resp:  [14-31] 24 (12/14 0700) BP: (103-142)/(48-99) 136/74 (12/14 0700) SpO2:  [76 %-100 %] 95 % (12/14 0700) Arterial Line BP: (53-180)/(40-115) 56/48 (12/14 0545) FiO2 (%):  [40 %-50 %] 40 % (12/13 1742) Weight:  [89.8 kg] 89.8 kg (12/14 0430)  Hemodynamic parameters for last 24 hours: PAP: (17-39)/(3-19) 21/3 PCWP:  [6 mmHg] 6 mmHg CO:  [3.2 L/min-5.8 L/min] 5.1 L/min CI:  [1.5 L/min/m2-2.8 L/min/m2] 2.4 L/min/m2  Intake/Output from previous day: 12/13 0701 - 12/14 0700 In: 4647.6 [I.V.:2926.1; Blood:500; IV Piggyback:1221.5] Out: 5003 [Urine:2831; Blood:850; Chest Tube:310] Intake/Output this shift: No intake/output data recorded.  General appearance: alert and cooperative Neurologic: intact Heart: regular rate and rhythm, S1, S2 normal, no murmur Lungs: clear to auscultation bilaterally Extremities: mild edema Wound: dressing dry  Lab Results: Recent Labs    06/12/20 1957 06/12/20 1959 06/13/20 0431  WBC 12.1*  --  13.5*  HGB 12.1* 11.6* 11.7*  HCT 34.8* 34.0* 34.1*  PLT 131*  --  150   BMET:  Recent Labs    06/12/20 1957 06/12/20 1959 06/13/20 0431  NA 137 137 139  K 4.3 4.3 4.0  CL 105  --  105  CO2 22  --  24  GLUCOSE 130*  --  129*  BUN 21  --  20  CREATININE 1.40*  --  1.59*  CALCIUM 8.1*  --  8.2*    PT/INR:  Recent Labs    06/12/20 1334  LABPROT 16.0*  INR 1.3*   ABG    Component Value Date/Time   PHART 7.401 06/12/2020 1959   HCO3 23.4 06/12/2020 1959    TCO2 25 06/12/2020 1959   ACIDBASEDEF 1.0 06/12/2020 1959   O2SAT 96.0 06/12/2020 1959   CBG (last 3)  Recent Labs    06/13/20 0240 06/13/20 0440 06/13/20 0647  GLUCAP 136* 125* 128*   CXR: left basilar atelectasis  ECG: sinus, non-specific changes  Assessment/Plan: S/P Procedure(s) (LRB): CORONARY ARTERY BYPASS GRAFTING (CABG), ON PUMP, TIMES FIVE, USING LEFT INTERNAL MAMMARY ARTERY AND ENDOSCOPICALLY HARVESTED RIGHT GREATER SAPHENOUS VEIN (N/A) TRANSESOPHAGEAL ECHOCARDIOGRAM (TEE) (N/A)  POD 1 Hemodynamically stable in sinus rhythm. Start low dose Lopressor.  Stage 3 CKD with baseline creat around 1.5. 1.6 this am. Will continue dopamine at 2 mcg today. Hold on diuresis until creat stable.  Volume excess: Wt only 4 lbs over preop. Diurese once creat stable.  DC  [place], arterial line.  DC MT's. Keep left pleural tube in today to avoid left effusion and work on IS to expand LLL.  OOB, mobilize.  DM: preop Hgb A1c 5.5 on no meds. Will give a dose of Levemir this am to get off insulin drip and start SSI.   LOS: 4 days    Carl Young 06/13/2020

## 2020-06-13 NOTE — Discharge Summary (Addendum)
**Note Carl-Identified via Obfuscation** Physician Discharge Summary  Patient ID: Carl Young MRN: [id number] DOB/AGE: 02-14-1941 79 y.o.  Admit date: 06/09/2020 Discharge date: 06/18/2020  Admission Diagnoses:  Coronary artery disease Unstable angina pectoris Hypertension Hyperlipidemia Chronic renal insufficiency, stage 3   Discharge Diagnoses:   Coronary artery disease Unstable angina pectoris Hypertension Hyperlipidemia S/P CABG x 5 Acute on chronic stage 3 renal insufficiency Left lower lobe atelectasis   Patient Active Problem List   Diagnosis Date Noted  . Atrial fibrillation ([place]) 06/18/2020  . S/P CABG x 5 06/12/2020  . Coronary artery disease involving native coronary artery of native heart with unstable angina pectoris ([place]) 06/09/2020  . Abnormal stress test 06/06/2020  . Hx of colonic polyps 06/21/2019  . Noninfectious gastroenteritis and colitis 03/28/2015  . Essential hypertension 03/28/2015  . High cholesterol 03/28/2015  . History of colonic polyps 03/28/2015   Discharged Condition: good  History of Present Illness:  79 year old man was in usual state of health of hypertension and hyperlipidemia until he had what appeared to be a near syncopal episode.  His wife insisted that he go to the emergency department in [place].  While there, he was diagnosed with bigeminy.  This led to a stress test which was positive.  In follow-up, the patient underwent left heart catheterization.  This demonstrated severe multivessel coronary artery disease.  He is admitted to the hospital and is awaiting CABG.  Since admission, he has been chest pain-free and has had no shortness of breath  Hospital Course:   Carl Young was taken to the OR on 06/12/20 where CABG x 5 was carried out without complication. He separated from cardiopulmonary bypass without difficulty and was transferred to the ICU in stable condition.  He remained hemodynamically stable. He was weaned from the mechanical ventilator and  extubated by 6:30pm on the day of surgery. Low-dose dopamine was continued for optimal renal perfusion. He was started on low-dose metoprolol on the first post-op day. He was mobilized. His renal function remained stable so the dopamine was discontinued on POD 2 and he was transferred to 4E Progressive Care on the same day.  Diuresis was initiated for mild volume excess. His mobility gradually improved. The pacer wires were removed on 12/18.  He did have postoperative atrial flutter/fibrillation was chemically cardioverted to sinus rhythm with amiodarone and beta-blocker.  He is not felt to require anticoagulation therapy at this time.  He does have occasional PVCs , but much less frequent than early postoperatively.  He has expected acute blood loss anemia which is stable.  Most recent hemoglobin hematocrit are 9.4/27.8 respectively.  He has undergone a good diuresis for mild postoperative volume overload and creatinine is stable at 1.28.  He does have some hypertensive readings and will be resumed on his preoperative dose of Cozaar.  Depending on course he may require resumption of his other antihypertensives over time and this will need to be determined as an outpatient.  Incisions are noted to be healing well without evidence of infection.  Oxygen has been weaned and he maintains good saturations on room air.  He is tolerating routine activities using standard protocol.  At the time of discharge the patient is felt to be quite stable.  Consults: None  Significant Diagnostic Studies:   LEFT HEART CATH AND CORONARY ANGIOGRAPHY    Conclusion    Prox RCA-1 lesion is 80% stenosed.  Prox RCA-2 lesion is 80% stenosed.  Mid RCA lesion is 95% stenosed.  Ost LAD to Prox LAD lesion  is 95% stenosed.  Mid LAD lesion is 70% stenosed.  Ramus lesion is 85% stenosed.  LPAV-1 lesion is 90% stenosed.  LPAV-2 lesion is 100% stenosed.  Lat 2nd Mrg lesion is 80% stenosed.  2nd Mrg-1 lesion is 50%  stenosed.  2nd Mrg-2 lesion is 90% stenosed.  Mid Cx lesion is 70% stenosed.  LV end diastolic pressure is normal.  The left ventricular systolic function is normal.   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   Mild LV dysfunction with EF estimated  At 40 - 45%. There is mild inferoapical and focal mid anterolateral hypocontractility. LVEDP 12 mmHg.  RECOMMENDATION: The patient will be admitted due to high-grade multivessel stenoses. Surgical consultation for CABG revascularization. With very high-grade ostial ID disease, will initiate heparin later this evening.   Wall Motion  Resting                 Left Heart  Left Ventricle The left ventricular systolic function is normal. LV end diastolic pressure is normal. No regional wall motion abnormalities. There is mild global LV dysfunction with focal apical inferior and mid anterolateral hypocontractility. EF estimate at approximately 40 to 45%. LVEDP 12 mm Hg.   Coronary Diagrams   Diagnostic Dominance: Left      ECHOCARDIOGRAM REPORT       Patient Name:  Carl Young Date of Exam: 06/10/2020  Medical Rec #: [id number]    Height:    72.0 in  Accession #:  [id number]   Weight:    193.6 lb  Date of Birth: [birth year]    BSA:     2.101 m  Patient Age:  79 years    BP:      133/70 mmHg  Patient Gender: M        HR:      69 bpm.  Exam Location: Inpatient   Procedure: 2D Echo, Cardiac Doppler and Color Doppler   Indications:  CAD     History:    Patient has no prior history of Echocardiogram  examinations.         CAD, Aortic Valve Disease and aortic stenosis; Risk         Factors:Dyslipidemia.    Sonographer:  Carl Young  Referring Phys: Carl Young    1. Left ventricular ejection fraction, by estimation, is 55 to 60%. The  left ventricle has normal function. The left ventricle has no regional  wall motion abnormalities. There is mild concentric left ventricular  hypertrophy. Left ventricular diastolic  parameters are consistent with Grade I diastolic dysfunction (impaired  relaxation).  2. Right ventricular systolic function is normal. The right ventricular  size is normal. There is normal pulmonary artery systolic pressure. The  estimated right ventricular systolic pressure is 94.5 mmHg.  3. The mitral valve is normal in structure. Mild mitral valve  regurgitation. No evidence of mitral stenosis.  4. The aortic valve is normal in structure. There is severe calcifcation  of the aortic valve. There is severe thickening of the aortic valve.  Aortic valve regurgitation is trivial. Mild aortic valve stenosis. Aortic  valve mean gradient measures 11.0  mmHg.  5. The inferior vena cava is normal in size with greater than 50%  respiratory variability, suggesting right atrial pressure of 3 mmHg.   FINDINGS  Left Ventricle: Left ventricular ejection fraction, by estimation, is 55  to 60%. The left ventricle has normal function. The left ventricle has no  regional wall motion abnormalities. The left ventricular internal cavity  size was normal in size. There is  mild concentric left ventricular hypertrophy. Left ventricular diastolic  parameters are consistent with Grade I diastolic dysfunction (impaired  relaxation). Normal left ventricular filling pressure.   Right Ventricle: The right ventricular size is normal. No increase in  right ventricular  wall thickness. Right ventricular systolic function is  normal. There is normal pulmonary artery systolic pressure. The tricuspid  regurgitant velocity is 1.93 m/s, and  with an assumed right atrial pressure of 3 mmHg, the estimated right  ventricular systolic pressure is 16.9 mmHg.   Left Atrium: Left atrial size was normal in size.   Right Atrium: Right atrial size was normal in size.   Pericardium: There is no evidence of pericardial effusion.   Mitral Valve: The mitral valve is normal in structure. Mild mitral valve  regurgitation. No evidence of mitral valve stenosis.   Tricuspid Valve: The tricuspid valve is normal in structure. Tricuspid  valve regurgitation is not demonstrated. No evidence of tricuspid  stenosis.   Aortic Valve: The aortic valve is normal in structure. There is severe  calcifcation of the aortic valve. There is severe thickening of the aortic  valve. Aortic valve regurgitation is trivial. Mild aortic stenosis is  present. Aortic valve mean gradient  measures 11.0 mmHg. Aortic valve peak gradient measures 19.8 mmHg. Aortic  valve area, by VTI measures 1.44 cm.   Pulmonic Valve: The pulmonic valve was normal in structure. Pulmonic valve  regurgitation is not visualized. No evidence of pulmonic stenosis.   Aorta: The aortic root is normal in size and structure.   Venous: The inferior vena cava is normal in size with greater than 50%  respiratory variability, suggesting right atrial pressure of 3 mmHg.   IAS/Shunts: No atrial level shunt detected by color flow Doppler.     LEFT VENTRICLE  PLAX 2D  LVIDd:     5.10 cm Diastology  LVIDs:     3.50 cm LV e' medial:  5.55 cm/s  LV PW:     1.30 cm LV E/e' medial: 12.9  LV IVS:    1.20 cm LV e' lateral:  9.36 cm/s  LVOT diam:   2.10 cm LV E/e' lateral: 7.6  LV SV:     65  LV SV Index:  31  LVOT Area:   3.46 cm     RIGHT VENTRICLE  RV Basal diam: 2.50 cm  RV S  prime:   5.98 cm/s  TAPSE (M-mode): 2.4 cm   LEFT ATRIUM       Index    RIGHT ATRIUM      Index  LA diam:    3.80 cm 1.81 cm/m RA Area:   13.50 cm  LA Vol (A2C):  37.6 ml 17.89 ml/m RA Volume:  29.30 ml 13.94 ml/m  LA Vol (A4C):  32.4 ml 15.42 ml/m  LA Biplane Vol: 37.2 ml 17.70 ml/m  AORTIC VALVE  AV Area (Vmax):  1.53 cm  AV Area (Vmean):  1.45 cm  AV Area (VTI):   1.44 cm  AV Vmax:  222.50 cm/s  AV Vmean:     157.000 cm/s  AV VTI:      0.454 m  AV Peak Grad:   19.8 mmHg  AV Mean Grad:   11.0 mmHg  LVOT Vmax:     98.60 cm/s  LVOT Vmean:    65.700 cm/s  LVOT VTI:     0.189 m  LVOT/AV VTI ratio: 0.42    AORTA  Ao Root diam: 3.70 cm   MITRAL VALVE        TRICUSPID VALVE  MV Area (PHT): 2.83 cm  TR Peak grad:  14.9 mmHg  MV Decel Time: 268 msec  TR Vmax:    193.00 cm/s  MV E velocity: 71.60 cm/s  MV A velocity: 87.00 cm/s SHUNTS  MV E/A ratio: 0.82    Systemic VTI: 0.19 m               Systemic Diam: 2.10 cm   Ena Dawley MD  Electronically signed by Ena Dawley MD  Signature Date/Time: 06/10/2020/1:22:33 PM   Treatments:   CARDIOVASCULAR SURGERY OPERATIVE NOTE  06/12/2020  Surgeon:  Gaye Pollack, MD  First Assistant: Enid Cutter,  PA-C   Preoperative Diagnosis:  Severe multi-vessel coronary artery disease   Postoperative Diagnosis:  Same   Procedure:  1. Median Sternotomy 2. Extracorporeal circulation 3.   Coronary artery bypass grafting x 5   Left internal mammary artery graft to the LAD  SVG to Ramus  Sequential SVG to OM2 and OM3  SVG to PDA (LCX) 4.   Endoscopic vein harvest from the right leg   Anesthesia:  General Endotracheal   Clinical History/Surgical Indication:  This 79 year old gentleman presented with a hx of tiredness, fatigue with exertion and indigestion-like chest discomfort.  He had an intermediate risk nuclear stress test showing a large inferior/inferoseptal/septal/apical MI with moderate peri-infarct ischemia. EF was 55-65%. Cath showed severe 3 vessel CAD. It was felt that CABG was the best treatment for him. Preop 2D echo showed mild AS with a mean gradient of 11 mm Hg and a calcified, thickened valve. I told him that I would evaluate his valve in the OR with TEE before making a decision to replace it or continue observation. I discussed the operative procedure with the patient including alternatives, benefits and risks; including but not limited to bleeding, blood transfusion, infection, stroke, myocardial infarction, graft failure, heart block requiring a permanent pacemaker, organ dysfunction, and death.  Oneil Behney Hardiman understands and agrees to proceed.     Discharge Exam: Blood pressure (!) 145/71, pulse 70, temperature 98.2 F (36.8 C), temperature source Oral, resp. rate 17, height 6' (1.829 m), weight 87.2 kg, SpO2 97 %.   General appearance: alert, cooperative and no distress Heart: regular rate and rhythm Lungs: clear to auscultation bilaterally Abdomen: benign Extremities: no edema Wound: incis healing well  Disposition: Discharge disposition: 01-Home or Self Care       Discharge Instructions    Amb Referral to Cardiac Rehabilitation   Complete by: As directed    Diagnosis: CABG   CABG X ___: 5   After initial evaluation and assessments completed: Virtual Based Care may be provided alone or in conjunction with Phase 2 Cardiac Rehab based on patient barriers.: Yes   Amb Referral to Cardiac Rehabilitation   Complete by: As directed    Diagnosis: CABG   CABG X ___: 5   After initial evaluation and assessments completed: Virtual Based Care may be provided alone  or in conjunction with Phase 2 Cardiac Rehab based on patient barriers.: Yes   Discharge patient   Complete by: As directed    Discharge disposition: 01-Home or Self Care    Discharge patient date: 06/18/2020     Allergies as of 06/18/2020      Reactions   Ciprofloxacin In D5w Rash, Other (See Comments)   Pruritis       Medication List    STOP taking these medications   amLODipine 10 MG tablet Commonly known as: NORVASC   chlorthalidone 25 MG tablet Commonly known as: HYGROTON     TAKE these medications   amiodarone 200 MG tablet Commonly known as: PACERONE Take 1 tablet (200 mg total) by mouth 2 (two) times daily.   ascorbic acid 1000 MG tablet Commonly known as: VITAMIN C Take 1,000 mg by mouth daily. Notes to patient: Take as you were prior to admission    aspirin 325 MG EC tablet Take 1 tablet (325 mg total) by mouth daily. What changed:   medication strength  how much to take  when to take this   benazepril 40 MG tablet Commonly known as: LOTENSIN Take 40 mg by mouth daily.   fenofibrate 145 MG tablet Commonly known as: TRICOR Take 145 mg by mouth every evening. Notes to patient: Take as you were prior to admission    fish oil-omega-3 fatty acids 1000 MG capsule Take 1 g by mouth at bedtime. Notes to patient: Take as you were prior to admission    folic acid 956 MCG tablet Commonly known as: FOLVITE Take 400 mcg by mouth daily. Notes to patient: Take as you were prior to admission    Garlic 2130 MG Caps Take 1,000 mg by mouth at bedtime.   Lysine 500 MG Caps Take 500 mg by mouth daily.   metoprolol succinate 25 MG 24 hr tablet Commonly known as: TOPROL-XL Take 1 tablet (25 mg total) by mouth daily. What changed:   how much to take  when to take this   multivitamin with minerals Tabs tablet Take 1 tablet by mouth daily.   PRESERVISION AREDS 2+MULTI VIT PO Take 1 capsule by mouth 2 (two) times daily.   rosuvastatin 40 MG tablet Commonly known as: CRESTOR Take 1 tablet (40 mg total) by mouth daily. What changed:   medication strength  how much to take  when to take this   traMADol 50 MG  tablet Commonly known as: ULTRAM Take 1 tablet (50 mg total) by mouth every 6 (six) hours as needed for moderate pain.   Vitamin D3 50 MCG (2000 UT) capsule Take 2,000 Units by mouth every evening. Notes to patient: Take as you were prior to admission   zinc gluconate 50 MG tablet Take 50 mg by mouth every evening. Notes to patient: Take as you were prior to admission        Follow-up Information    Verta Ellen., NP. Go on 07/07/2020.   Specialty: Cardiology Why: Your appointment is on Friday, 07/07/20 at 10:30. Contact information: [place] [address] [phone]        Gaye Pollack, MD. Go on 07/12/2020.   Specialty: Cardiothoracic Surgery Why: Your appointment is at 10:30am.  Please arrive 30 minutes early for a chest x-ray to be performed by [facility] located on the first floor of the same building.  Contact information: [address] [place] [address] [phone]  The patient has been discharged on:   1.Beta Blocker:  Yes [  y ]                              No   [   ]                              If No, reason:  2.Ace Inhibitor/ARB: Yes [ y  ]                                     No  [    ]                                     If No, reason:  3.Statin:   Yes [ y  ]                  No  [   ]                  If No, reason:  4.Ecasa:  Yes  [  y ]                  No   [   ]                  If No, reason:      Signed: John Giovanni 06/18/2020, 9:33 AM

## 2020-06-14 ENCOUNTER — Inpatient Hospital Stay (HOSPITAL_COMMUNITY): Payer: Medicare Other

## 2020-06-14 LAB — BASIC METABOLIC PANEL
Anion gap: 10 (ref 5–15)
BUN: 21 mg/dL (ref 8–23)
CO2: 26 mmol/L (ref 22–32)
Calcium: 8.2 mg/dL — ABNORMAL LOW (ref 8.9–10.3)
Chloride: 104 mmol/L (ref 98–111)
Creatinine, Ser: 1.17 mg/dL (ref 0.61–1.24)
GFR, Estimated: >60 mL/min (ref >60)
Glucose, Bld: 83 mg/dL (ref 70–99)
Potassium: 3.8 mmol/L (ref 3.5–5.1)
Sodium: 140 mmol/L (ref 135–145)

## 2020-06-14 LAB — CBC
HCT: 30.3 % — ABNORMAL LOW (ref 39.0–52.0)
Hemoglobin: 9.7 g/dL — ABNORMAL LOW (ref 13.0–17.0)
MCH: 31.6 pg (ref 26.0–34.0)
MCHC: 32 g/dL (ref 30.0–36.0)
MCV: 98.7 fL (ref 80.0–100.0)
Platelets: 115 10*3/uL — ABNORMAL LOW (ref 150–400)
RBC: 3.07 MIL/uL — ABNORMAL LOW (ref 4.22–5.81)
RDW: 14.4 % (ref 11.5–15.5)
WBC: 10.6 10*3/uL — ABNORMAL HIGH (ref 4.0–10.5)
nRBC: 0 % (ref 0.0–0.2)

## 2020-06-14 LAB — GLUCOSE, CAPILLARY
Glucose-Capillary: 74 mg/dL (ref 70–99)
Glucose-Capillary: 71 mg/dL (ref 70–99)
Glucose-Capillary: 105 mg/dL — ABNORMAL HIGH (ref 70–99)
Glucose-Capillary: 106 mg/dL — ABNORMAL HIGH (ref 70–99)

## 2020-06-14 MED ORDER — TRAMADOL HCL 50 MG PO TABS
50.0000 mg | ORAL_TABLET | Freq: Four times a day (QID) | ORAL | Status: DC | PRN
  Administered 2020-06-15 (×2): 50 mg via ORAL
  Filled 2020-06-14 (×2): qty 1

## 2020-06-14 MED ORDER — FOLIC ACID 400 MCG PO TABS: 400.0000 ug | ORAL_TABLET | Freq: Every day | ORAL | Status: DC

## 2020-06-14 MED ORDER — ~~LOC~~ CARDIAC SURGERY, PATIENT & FAMILY EDUCATION: Freq: Once | Status: DC

## 2020-06-14 MED ORDER — SODIUM CHLORIDE 0.9 % IV SOLN: 250.0000 mL | INTRAVENOUS | Status: DC | PRN

## 2020-06-14 MED ORDER — OXYCODONE HCL 5 MG PO TABS: 5.0000 mg | ORAL_TABLET | ORAL | Status: DC | PRN

## 2020-06-14 MED ORDER — SODIUM CHLORIDE 0.9% FLUSH
3.0000 mL | INTRAVENOUS | Status: DC | PRN
  Administered 2020-06-14: 21:00:00 3 mL via INTRAVENOUS

## 2020-06-14 MED ORDER — SODIUM CHLORIDE 0.9% FLUSH
3.0000 mL | Freq: Two times a day (BID) | INTRAVENOUS | Status: DC
  Administered 2020-06-15 – 2020-06-17 (×3): 3 mL via INTRAVENOUS

## 2020-06-14 NOTE — Progress Notes (Addendum)
[facility] DAILY ICU PROGRESS NOTE                   [address]            [place] [address]          [phone]   2 Days Post-Op Procedure(s) (LRB): CORONARY ARTERY BYPASS GRAFTING (CABG), ON PUMP, TIMES FIVE, USING LEFT INTERNAL MAMMARY ARTERY AND ENDOSCOPICALLY HARVESTED RIGHT GREATER SAPHENOUS VEIN (N/A) TRANSESOPHAGEAL ECHOCARDIOGRAM (TEE) (N/A)  Total Length of Stay:  LOS: 5 days   Subjective: Patient sitting in chair. He has some pain at chest tube site this am.  Objective: Vital signs in last 24 hours: Temp:  [98.7 F (37.1 C)-99.9 F (37.7 C)] 99.9 F (37.7 C) (12/15 0747) Pulse Rate:  [46-87] 73 (12/15 0800) Cardiac Rhythm: Normal sinus rhythm (12/15 0800) Resp:  [15-25] 19 (12/15 0800) BP: (101-160)/(43-99) 145/43 (12/15 0800) SpO2:  [71 %-100 %] 91 % (12/15 0800) FiO2 (%):  [2 %] 2 % (12/15 0800) Weight:  [89.7 kg] 89.7 kg (12/15 0611)  Filed Weights   06/11/20 0515 06/13/20 0430 06/14/20 0611  Weight: 87 kg 89.8 kg 89.7 kg    Weight change: -0.1 kg   Hemodynamic parameters for last 24 hours: PAP: (21-29)/(4-9) 28/8  Intake/Output from previous day: 12/14 0701 - 12/15 0700 In: 1248.6 [P.O.:480; I.V.:568.8; IV Piggyback:199.9] Out: 1740 [Urine:1660; Chest Tube:80]  Intake/Output this shift: Total I/O In: 23.2 [I.V.:23.2] Out: 40 [Urine:40]  Current Meds: Scheduled Meds: . acetaminophen  1,000 mg Oral Q6H  . aspirin EC  325 mg Oral Daily   Or  . aspirin  324 mg Per Tube Daily  . bisacodyl  10 mg Oral Daily   Or  . bisacodyl  10 mg Rectal Daily  . Chlorhexidine Gluconate Cloth  6 each Topical Daily  . docusate sodium  200 mg Oral Daily  . enoxaparin (LOVENOX) injection  40 mg Subcutaneous QHS  . insulin aspart  0-24 Units Subcutaneous TID WC & HS  . insulin detemir  20 Units Subcutaneous Daily  . mouth rinse  15 mL Mouth Rinse BID  . metoprolol tartrate  12.5 mg Oral BID   Or  . metoprolol tartrate  12.5 mg Per Tube BID  .  pantoprazole  40 mg Oral Daily  . rosuvastatin  40 mg Oral Daily  . sodium chloride flush  10-40 mL Intracatheter Q12H  . sodium chloride flush  3 mL Intravenous Q12H   Continuous Infusions: . sodium chloride Stopped (06/13/20 0955)  . sodium chloride Stopped (06/13/20 1200)  . sodium chloride 10 mL/hr at 06/12/20 1435  . DOPamine 2 mcg/kg/min (06/14/20 0800)  . lactated ringers    . lactated ringers 20 mL/hr at 06/14/20 0800  . nitroGLYCERIN 0 mcg/min (06/12/20 1407)  . phenylephrine (NEO-SYNEPHRINE) Adult infusion Stopped (06/12/20 2319)   PRN Meds:.sodium chloride, metoprolol tartrate, morphine injection, ondansetron (ZOFRAN) IV, oxyCODONE, phenol, sodium chloride flush, sodium chloride flush, traMADol  General appearance: alert, cooperative and no distress Neurologic: intact Heart: RRR Lungs: Slightly diminshed at bases L>R Abdomen: Soft, non tender, bowel sounds present Extremities: Mild RLE edema Wound: Sternal dressing dry intact;RLE wound is clean and dry  Lab Results: CBC: Recent Labs    06/13/20 1741 06/14/20 0500  WBC 11.7* 10.6*  HGB 10.3* 9.7*  HCT 30.6* 30.3*  PLT 126* 115*   BMET:  Recent Labs    06/13/20 1741 06/14/20 0500  NA 137 140  K 3.9 3.8  CL 103 104  CO2 24 26  GLUCOSE 142* 83  BUN 23 21  CREATININE 1.44* 1.17  CALCIUM 8.1* 8.2*    CMET: Lab Results  Component Value Date   WBC 10.6 (H) 06/14/2020   HGB 9.7 (L) 06/14/2020   HCT 30.3 (L) 06/14/2020   PLT 115 (L) 06/14/2020   GLUCOSE 83 06/14/2020   ALT 33 03/24/2020   AST 37 03/24/2020   NA 140 06/14/2020   K 3.8 06/14/2020   CL 104 06/14/2020   CREATININE 1.17 06/14/2020   BUN 21 06/14/2020   CO2 26 06/14/2020   INR 1.3 (H) 06/12/2020   HGBA1C 5.5 06/11/2020      PT/INR:  Recent Labs    06/12/20 1334  LABPROT 16.0*  INR 1.3*   Radiology: DG Chest Port 1 View  Result Date: 06/14/2020 CLINICAL DATA:  [place] catheter removed EXAM: PORTABLE CHEST 1 VIEW  COMPARISON:  June 13, 2020. FINDINGS: Swan-Ganz catheter has been removed. Cordis tip is in the superior vena cava near the junction with the right jugular vein. Chest tube remains on the left. No pneumothorax. There is bibasilar atelectatic change with small left pleural effusion. No consolidation. Heart is upper normal in size with pulmonary vascularity normal. No adenopathy. No bone lesions. IMPRESSION: Tube and catheter positions as described without pneumothorax. Bibasilar atelectasis with small left pleural effusion. Stable cardiac silhouette. Electronically Signed   By: Lowella Grip III M.D.   On: 06/14/2020 08:05     Assessment/Plan: S/P Procedure(s) (LRB): CORONARY ARTERY BYPASS GRAFTING (CABG), ON PUMP, TIMES FIVE, USING LEFT INTERNAL MAMMARY ARTERY AND ENDOSCOPICALLY HARVESTED RIGHT GREATER SAPHENOUS VEIN (N/A) TRANSESOPHAGEAL ECHOCARDIOGRAM (TEE) (N/A)   1. CV-SR. On Lopressor 12.5 mg bid and Dopamine drip. Will stop Dopamine drip (as discussed with Dr. Cyndia Bent).  2. Pulmonary-on 2 liters of oxygen via [place]. Wean as able. Chest tube with 80 cc of output last 24 hours. CXR this am appears stable (no pneumothorax, bibasilar atelectasis, small left pleural effusion). Will remove chest tube. Encourage incentive spirometer. 3. Volume overload-will give Lasix in the am 4. CBGs 111/97/74. Pre op HGA1C 5.5. Will stop accu checks and SS in next few days. 5. Expected post op blood loss anemia-H and H this am stable at 9.7 and 30.3 6. Mild thrombocytopenia-platelets this am 115.000 7. CKD (stage III)-Creatinine decreased from 1.44 to 1.17. On Dopamine drip. Creatinine upon admission 1.4 8. Remove sleeve, foley 9. Transfer to 4E  Sharalyn Ink [facility] 06/14/2020 8:41 AM    Chart reviewed, patient examined, agree with above. He feels well. Renal function improved and creat below baseline. Will DC dopamine and start diuresis. Transfer to 4E and continue mobilization.

## 2020-06-14 NOTE — Hospital Course (Signed)
Hospital Course: On post op day two, patient's creatinine was decreased to 1.17. He was weaned off the Dopamine drip. Chest tube, foley, and sleeve were removed on this day as well. He was surgically stable for transfer from the ICU to 4E for further convalescence.

## 2020-06-14 NOTE — Progress Notes (Signed)
PHARMACIST - PHYSICIAN ORDER COMMUNICATION  CONCERNING: P&T Medication Policy on Herbal Medications  DESCRIPTION:  This patient's order for:  Carl Young  has been noted.  This product(s) is classified as an "herbal" or natural product. Due to a lack of definitive safety studies or FDA approval, nonstandard manufacturing practices, plus the potential risk of unknown drug-drug interactions while on inpatient medications, the Pharmacy and Therapeutics Committee does not permit the use of "herbal" or natural products of this type within [facility].   ACTION TAKEN: The pharmacy department is unable to verify this order at this time and your patient has been informed of this safety policy. Please reevaluate patient's clinical condition at discharge and address if the herbal or natural product(s) should be resumed at that time.

## 2020-06-14 NOTE — Progress Notes (Signed)
CARDIAC REHAB PHASE I   PRE:  Rate/Rhythm: 75 SR trig PVCs  BP:  Supine: 116/51  Sitting:   Standing:    SaO2: 99% 2L   MODE:  Ambulation: 290 ft   POST:  Rate/Rhythm: 76 SR PACs PVCs  BP:  Supine:   Sitting: 134/60  Standing:    SaO2: 93% RA had oxygen off at end of walk  Put back on for comfort 1330-1413 Pt walked 290 ft on 2l with EVA and asst x 1. Gait steady. Tolerated well. Oxygen came off at the end of walk and sats good on RA. Encouraged IS and he demonstrated 1000 ml x 3 correctly. Back to bed at his request. Pt has two stairs in house and pt and wife would like him to do stairs with PT prior to discharge.  Second walk today.    Graylon Good, RN BSN  06/14/2020 2:09 PM

## 2020-06-15 ENCOUNTER — Inpatient Hospital Stay (HOSPITAL_COMMUNITY): Payer: Medicare Other

## 2020-06-15 LAB — GLUCOSE, CAPILLARY
Glucose-Capillary: 62 mg/dL — ABNORMAL LOW (ref 70–99)
Glucose-Capillary: 86 mg/dL (ref 70–99)
Glucose-Capillary: 73 mg/dL (ref 70–99)
Glucose-Capillary: 74 mg/dL (ref 70–99)
Glucose-Capillary: 86 mg/dL (ref 70–99)

## 2020-06-15 LAB — BASIC METABOLIC PANEL
Anion gap: 9 (ref 5–15)
BUN: 27 mg/dL — ABNORMAL HIGH (ref 8–23)
CO2: 26 mmol/L (ref 22–32)
Calcium: 8.2 mg/dL — ABNORMAL LOW (ref 8.9–10.3)
Chloride: 103 mmol/L (ref 98–111)
Creatinine, Ser: 1.4 mg/dL — ABNORMAL HIGH (ref 0.61–1.24)
GFR, Estimated: 50 mL/min — ABNORMAL LOW (ref >60)
Glucose, Bld: 83 mg/dL (ref 70–99)
Potassium: 3.7 mmol/L (ref 3.5–5.1)
Sodium: 138 mmol/L (ref 135–145)

## 2020-06-15 LAB — CBC
HCT: 27.3 % — ABNORMAL LOW (ref 39.0–52.0)
Hemoglobin: 9.4 g/dL — ABNORMAL LOW (ref 13.0–17.0)
MCH: 33 pg (ref 26.0–34.0)
MCHC: 34.4 g/dL (ref 30.0–36.0)
MCV: 95.8 fL (ref 80.0–100.0)
Platelets: 124 10*3/uL — ABNORMAL LOW (ref 150–400)
RBC: 2.85 MIL/uL — ABNORMAL LOW (ref 4.22–5.81)
RDW: 14.2 % (ref 11.5–15.5)
WBC: 8.8 10*3/uL (ref 4.0–10.5)
nRBC: 0 % (ref 0.0–0.2)

## 2020-06-15 MED ORDER — METOPROLOL TARTRATE 25 MG/10 ML ORAL SUSPENSION
12.5000 mg | Freq: Two times a day (BID) | ORAL | Status: DC
  Filled 2020-06-15 (×7): qty 5

## 2020-06-15 MED ORDER — METOPROLOL TARTRATE 25 MG PO TABS
25.0000 mg | ORAL_TABLET | Freq: Two times a day (BID) | ORAL | Status: DC
  Administered 2020-06-15 – 2020-06-18 (×7): 25 mg via ORAL
  Filled 2020-06-15 (×7): qty 1

## 2020-06-15 MED ORDER — POTASSIUM CHLORIDE CRYS ER 20 MEQ PO TBCR
40.0000 meq | EXTENDED_RELEASE_TABLET | Freq: Once | ORAL | Status: AC
  Administered 2020-06-15: 08:00:00 40 meq via ORAL
  Filled 2020-06-15: qty 2

## 2020-06-15 NOTE — Care Management Important Message (Signed)
Important Message  Patient Details  Name: Carl Young MRN: [id number] Date of Birth: 1940/11/30   Medicare Important Message Given:  Yes     [name] 06/15/2020, 10:54 AM

## 2020-06-15 NOTE — Progress Notes (Signed)
Patient taken to xray by transporter. Radiology called and stated they were unable to do morning xray at this time due to patient not being properly positioned in bed. Patient unable to pull himself up due to sternal precautions. Radiology stated they did not have another staff member present to assist with pulling patient up into position for taking chest xray. Stated that he would have to be returned to the floor and they would come and get him later.

## 2020-06-15 NOTE — Progress Notes (Signed)
POD 3- gauze dressing removed from midsternal incision. Betadine applied. Incision has no drainage and edges are approximated. Incision left open to air per orders.

## 2020-06-15 NOTE — Progress Notes (Addendum)
[address]       [place] [address]             [phone]      3 Days Post-Op Procedure(s) (LRB): CORONARY ARTERY BYPASS GRAFTING (CABG), ON PUMP, TIMES FIVE, USING LEFT INTERNAL MAMMARY ARTERY AND ENDOSCOPICALLY HARVESTED RIGHT GREATER SAPHENOUS VEIN (N/A) TRANSESOPHAGEAL ECHOCARDIOGRAM (TEE) (N/A) Subjective: Feels ok, had a rough night  Objective: Vital signs in last 24 hours: Temp:  [98.1 F (36.7 C)-99.9 F (37.7 C)] 98.1 F (36.7 C) (12/16 0419) Pulse Rate:  [52-121] 71 (12/16 0419) Cardiac Rhythm: Normal sinus rhythm (12/16 0233) Resp:  [16-26] 18 (12/16 0419) BP: (98-151)/(41-94) 151/73 (12/16 0419) SpO2:  [91 %-98 %] 98 % (12/16 0419) FiO2 (%):  [2 %] 2 % (12/15 0800) Weight:  [88.8 kg] 88.8 kg (12/16 0414)  Hemodynamic parameters for last 24 hours:    Intake/Output from previous day: 12/15 0701 - 12/16 0700 In: 126.2 [I.V.:26.2; IV Piggyback:100] Out: 560 [Urine:540; Chest Tube:20] Intake/Output this shift: No intake/output data recorded.  General appearance: alert, cooperative and no distress Heart: regular rate and rhythm and freq extrasystoles Lungs: clear to auscultation bilaterally Abdomen: benign Extremities: trace edema  Lab Results: Recent Labs    06/14/20 0500 06/15/20 0109  WBC 10.6* 8.8  HGB 9.7* 9.4*  HCT 30.3* 27.3*  PLT 115* 124*   BMET:  Recent Labs    06/14/20 0500 06/15/20 0109  NA 140 138  K 3.8 3.7  CL 104 103  CO2 26 26  GLUCOSE 83 83  BUN 21 27*  CREATININE 1.17 1.40*  CALCIUM 8.2* 8.2*    PT/INR:  Recent Labs    06/12/20 1334  LABPROT 16.0*  INR 1.3*   ABG    Component Value Date/Time   PHART 7.401 06/12/2020 1959   HCO3 23.4 06/12/2020 1959   TCO2 25 06/12/2020 1959   ACIDBASEDEF 1.0 06/12/2020 1959   O2SAT 96.0 06/12/2020 1959   CBG (last 3)  Recent Labs    06/14/20 2104 06/15/20 0630 06/15/20 0655  GLUCAP 106* 62* 86    Meds Scheduled Meds: . acetaminophen   1,000 mg Oral Q6H  . aspirin EC  325 mg Oral Daily   Or  . aspirin  324 mg Per Tube Daily  . bisacodyl  10 mg Oral Daily   Or  . bisacodyl  10 mg Rectal Daily  . Chlorhexidine Gluconate Cloth  6 each Topical Daily  . [place] [facility], Patient & Family Education   Does not apply Once  . docusate sodium  200 mg Oral Daily  . enoxaparin (LOVENOX) injection  40 mg Subcutaneous QHS  . insulin aspart  0-24 Units Subcutaneous TID WC & HS  . insulin detemir  20 Units Subcutaneous Daily  . mouth rinse  15 mL Mouth Rinse BID  . metoprolol tartrate  12.5 mg Oral BID   Or  . metoprolol tartrate  12.5 mg Per Tube BID  . pantoprazole  40 mg Oral Daily  . rosuvastatin  40 mg Oral Daily  . sodium chloride flush  10-40 mL Intracatheter Q12H  . sodium chloride flush  3 mL Intravenous Q12H   Continuous Infusions: . sodium chloride Stopped (06/13/20 0955)  . sodium chloride Stopped (06/13/20 1200)  . sodium chloride 10 mL/hr at 06/12/20 1435  . sodium chloride    . lactated ringers    . lactated ringers Stopped (06/14/20 0808)   PRN Meds:.sodium chloride, sodium  chloride, metoprolol tartrate, ondansetron (ZOFRAN) IV, oxyCODONE, phenol, sodium chloride flush, sodium chloride flush, traMADol  Xrays DG Chest Port 1 View  Result Date: 06/14/2020 CLINICAL DATA:  Swan-Ganz catheter removed EXAM: PORTABLE CHEST 1 VIEW COMPARISON:  June 13, 2020. FINDINGS: Swan-Ganz catheter has been removed. Cordis tip is in the superior vena cava near the junction with the right jugular vein. Chest tube remains on the left. No pneumothorax. There is bibasilar atelectatic change with small left pleural effusion. No consolidation. Heart is upper normal in size with pulmonary vascularity normal. No adenopathy. No bone lesions. IMPRESSION: Tube and catheter positions as described without pneumothorax. Bibasilar atelectasis with small left pleural effusion. Stable cardiac silhouette. Electronically Signed   By:  Lowella Grip III M.D.   On: 06/14/2020 08:05    Assessment/Plan: S/P Procedure(s) (LRB): CORONARY ARTERY BYPASS GRAFTING (CABG), ON PUMP, TIMES FIVE, USING LEFT INTERNAL MAMMARY ARTERY AND ENDOSCOPICALLY HARVESTED RIGHT GREATER SAPHENOUS VEIN (N/A) TRANSESOPHAGEAL ECHOCARDIOGRAM (TEE) (N/A)    1 Tmax 99.9, VSS- SBP 99-151 range, sinus rhythm with freq PVC's, will increase beta blocker and replace K+ 2 sats good on 2 liters 3 BS control is good 4 H/H down a little, ABLA, still equalibrating 5 weight fairly stable, UOP not accurate. Around preop weight. Creat did trend up today, now 1.4. Plan was for diuretic but hasn't been written for. Minor edema will hold for now 6 CXR is pending 7 no leukocytosis 8 thrombocytopenia improving trend    LOS: 6 days    Carl Giovanni PA-C Pager [phone] 06/15/2020   Chart reviewed, patient examined, agree with above. He looks fine.  Creat yesterday probably falsely low at 1.17. His baseline is 1.47 and it is 1.40 today. Wt is only a few lbs over preop and he has no significant edema so I think we can observe unless wt trends upward. He was on chlorthalidone preop.  Continue IS, ambulation.

## 2020-06-15 NOTE — Progress Notes (Signed)
Mobility Specialist - Progress Note   06/15/20 1121  Mobility  Activity Ambulated in hall  Level of Assistance Minimal assist, patient does 75% or more  Assistive Device Front wheel walker  Distance Ambulated (ft) 250 ft  Mobility Response Tolerated well  Mobility performed by Mobility specialist  $Mobility charge 1 Mobility    Pre-mobility: 82 HR, 131/75 BP Post-mobility: 108 HR, 147/76 BP  Pt min assist to stand. Slight unsteadiness when standing, but was able to correct himself. At ~30 ft, pt stumbled to his L and required assistance to correct himself. Otherwise asx throughout. Pt in bed after walk.   Pricilla Handler Mobility Specialist Mobility Specialist Phone: [phone]

## 2020-06-15 NOTE — Progress Notes (Signed)
Hypoglycemic Event  CBG: 62  Treatment: 4 oz juice/soda-patient breakfast tray at bedside  Symptoms: None  Follow-up CBG: [id number] CBG Result:86  Possible Reasons for Event: Unknown  Comments/MD notified    Carl Young

## 2020-06-15 NOTE — Progress Notes (Signed)
[id number] Came to see pt to walk. Pt sitting on BSC. Stated he felt like BM was stuck. Helped pt get cleaned up. Pt did not feel like he could walk right now. Notified RN of pt's constipation. Pt used IS and getting 1000 ml. Encouraged him to walk with staff and Mobility specialist if I do not get back. To recliner with call bell. Graylon Good RN BSN 06/15/2020 10:15 AM

## 2020-06-16 LAB — BASIC METABOLIC PANEL
Anion gap: 10 (ref 5–15)
BUN: 23 mg/dL (ref 8–23)
CO2: 23 mmol/L (ref 22–32)
Calcium: 8.5 mg/dL — ABNORMAL LOW (ref 8.9–10.3)
Chloride: 104 mmol/L (ref 98–111)
Creatinine, Ser: 1.25 mg/dL — ABNORMAL HIGH (ref 0.61–1.24)
GFR, Estimated: 58 mL/min — ABNORMAL LOW (ref >60)
Glucose, Bld: 68 mg/dL — ABNORMAL LOW (ref 70–99)
Potassium: 3.6 mmol/L (ref 3.5–5.1)
Sodium: 137 mmol/L (ref 135–145)

## 2020-06-16 LAB — CBC
HCT: 30.7 % — ABNORMAL LOW (ref 39.0–52.0)
Hemoglobin: 9.9 g/dL — ABNORMAL LOW (ref 13.0–17.0)
MCH: 31.1 pg (ref 26.0–34.0)
MCHC: 32.2 g/dL (ref 30.0–36.0)
MCV: 96.5 fL (ref 80.0–100.0)
Platelets: 166 10*3/uL (ref 150–400)
RBC: 3.18 MIL/uL — ABNORMAL LOW (ref 4.22–5.81)
RDW: 14.2 % (ref 11.5–15.5)
WBC: 9.9 10*3/uL (ref 4.0–10.5)
nRBC: 0 % (ref 0.0–0.2)

## 2020-06-16 LAB — GLUCOSE, CAPILLARY
Glucose-Capillary: 101 mg/dL — ABNORMAL HIGH (ref 70–99)
Glucose-Capillary: 69 mg/dL — ABNORMAL LOW (ref 70–99)
Glucose-Capillary: 90 mg/dL (ref 70–99)
Glucose-Capillary: 88 mg/dL (ref 70–99)
Glucose-Capillary: 125 mg/dL — ABNORMAL HIGH (ref 70–99)

## 2020-06-16 MED ORDER — AMIODARONE HCL 200 MG PO TABS
400.0000 mg | ORAL_TABLET | Freq: Two times a day (BID) | ORAL | Status: DC
  Administered 2020-06-16 – 2020-06-18 (×5): 400 mg via ORAL
  Filled 2020-06-16 (×5): qty 2

## 2020-06-16 MED ORDER — POTASSIUM CHLORIDE CRYS ER 20 MEQ PO TBCR
40.0000 meq | EXTENDED_RELEASE_TABLET | Freq: Once | ORAL | Status: AC
  Administered 2020-06-16: 11:00:00 40 meq via ORAL
  Filled 2020-06-16: qty 2

## 2020-06-16 NOTE — Progress Notes (Signed)
Mobility Specialist - Progress Note   06/16/20 1344  Mobility  Activity Ambulated in hall  Level of Assistance Minimal assist, patient does 75% or more  Assistive Device Front wheel walker  Distance Ambulated (ft) 430 ft  Mobility Response Tolerated well  Mobility performed by Mobility specialist  $Mobility charge 1 Mobility   Pt min assist to elevate trunk when getting out of bed and to raise legs when getting into bed. Asxand HR ~80 throughout. Wife asking if they will need to get a RW for home.   Pricilla Handler Mobility Specialist Mobility Specialist Phone: [phone]

## 2020-06-16 NOTE — Progress Notes (Signed)
Pt sitting in recliner, complaining of feet being asleep.  Assisted Pt to take short walk, approx 250 ft in hall, to relieve sensation.  Assisted to bed after walk, Pt appreciative.  Will cont plan of care.

## 2020-06-16 NOTE — Progress Notes (Signed)
CARDIAC REHAB PHASE I   PRE:  Rate/Rhythm: 72 SR PVCs  BP:  Supine: 141/58  Sitting:   Standing:    SaO2: 92%RA  MODE:  Ambulation: 370 ft   POST:  Rate/Rhythm: 94 SR  BP:  Supine:   Sitting: 147/77  Standing:    SaO2: 96%RA [id number] Assisted pt to bathroom and then he walked 370 ft on RA with gait belt use and rolling walker. Pt stated he thinks he has walker at home. Pt does not think he needs to practice stairs now as he has only two. Will not pursue PT order. Pt tolerated well. To recliner after walk. Had pt use IS and he can now get to 1250 ml. Encouraged two more walks today and use of IS. Discussed CRP 2 and referral to [facility]. Reinforced sternal precautions.   Graylon Good, RN BSN  06/16/2020 9:11 AM

## 2020-06-16 NOTE — Progress Notes (Addendum)
[address]       [place] [address]             [phone]      4 Days Post-Op Procedure(s) (LRB): CORONARY ARTERY BYPASS GRAFTING (CABG), ON PUMP, TIMES FIVE, USING LEFT INTERNAL MAMMARY ARTERY AND ENDOSCOPICALLY HARVESTED RIGHT GREATER SAPHENOUS VEIN (N/A) TRANSESOPHAGEAL ECHOCARDIOGRAM (TEE) (N/A) Subjective: Feels ok, minor pain  Objective: Vital signs in last 24 hours: Temp:  [98.2 F (36.8 C)-99 F (37.2 C)] 98.8 F (37.1 C) (12/17 0357) Pulse Rate:  [71-86] 77 (12/17 0357) Cardiac Rhythm: Atrial fibrillation (12/17 0145) Resp:  [16-18] 16 (12/17 0357) BP: (116-145)/(64-72) 122/68 (12/17 0357) SpO2:  [91 %-100 %] 100 % (12/17 0357) Weight:  [88 kg] 88 kg (12/17 0607)  Hemodynamic parameters for last 24 hours:    Intake/Output from previous day: 12/16 0701 - 12/17 0700 In: -  Out: 1004 [Urine:1002; Stool:2] Intake/Output this shift: No intake/output data recorded.  General appearance: alert, cooperative and no distress Heart: regular rate and rhythm and frequent extrasystoles Lungs: min dim in bases Abdomen: benign Extremities: no signif edema Wound: incis healing well  Lab Results: Recent Labs    06/15/20 0109 06/16/20 0034  WBC 8.8 9.9  HGB 9.4* 9.9*  HCT 27.3* 30.7*  PLT 124* 166   BMET:  Recent Labs    06/15/20 0109 06/16/20 0034  NA 138 137  K 3.7 3.6  CL 103 104  CO2 26 23  GLUCOSE 83 68*  BUN 27* 23  CREATININE 1.40* 1.25*  CALCIUM 8.2* 8.5*    PT/INR: No results for input(s): LABPROT, INR in the last 72 hours. ABG    Component Value Date/Time   PHART 7.401 06/12/2020 1959   HCO3 23.4 06/12/2020 1959   TCO2 25 06/12/2020 1959   ACIDBASEDEF 1.0 06/12/2020 1959   O2SAT 96.0 06/12/2020 1959   CBG (last 3)  Recent Labs    06/15/20 1715 06/15/20 2115 06/16/20 0646  GLUCAP 74 86 101*    Meds Scheduled Meds: . acetaminophen  1,000 mg Oral Q6H  . aspirin EC  325 mg Oral Daily   Or  . aspirin  324  mg Per Tube Daily  . bisacodyl  10 mg Oral Daily   Or  . bisacodyl  10 mg Rectal Daily  . Chlorhexidine Gluconate Cloth  6 each Topical Daily  . [place] [facility], Patient & Family Education   Does not apply Once  . docusate sodium  200 mg Oral Daily  . enoxaparin (LOVENOX) injection  40 mg Subcutaneous QHS  . insulin aspart  0-24 Units Subcutaneous TID WC & HS  . insulin detemir  20 Units Subcutaneous Daily  . mouth rinse  15 mL Mouth Rinse BID  . metoprolol tartrate  25 mg Oral BID   Or  . metoprolol tartrate  12.5 mg Per Tube BID  . pantoprazole  40 mg Oral Daily  . rosuvastatin  40 mg Oral Daily  . sodium chloride flush  10-40 mL Intracatheter Q12H  . sodium chloride flush  3 mL Intravenous Q12H   Continuous Infusions: . sodium chloride Stopped (06/13/20 0955)  . sodium chloride Stopped (06/13/20 1200)  . sodium chloride 10 mL/hr at 06/12/20 1435  . sodium chloride    . lactated ringers    . lactated ringers Stopped (06/14/20 0808)   PRN Meds:.sodium chloride, sodium chloride, metoprolol tartrate, ondansetron (ZOFRAN) IV, oxyCODONE, phenol, sodium chloride flush, sodium chloride flush, traMADol  Xrays DG Chest 2 View  Result Date: 06/15/2020 CLINICAL DATA:  Pneumothorax. EXAM: CHEST - 2 VIEW COMPARISON:  Same day. FINDINGS: Stable cardiomediastinal silhouette. Status post coronary bypass graft. Left-sided chest tube has been removed without pneumothorax. Minimal right basilar subsegmental atelectasis is noted. Elevated left hemidiaphragm is noted with mild left basilar atelectasis and possible small left pleural effusion. Bony thorax is unremarkable. IMPRESSION: Left-sided chest tube has been removed without pneumothorax. Minimal right basilar subsegmental atelectasis. Elevated left hemidiaphragm is noted with mild left basilar atelectasis and possible small left pleural effusion. Electronically Signed   By: Marijo Conception M.D.   On: 06/15/2020 09:35     Assessment/Plan: S/P Procedure(s) (LRB): CORONARY ARTERY BYPASS GRAFTING (CABG), ON PUMP, TIMES FIVE, USING LEFT INTERNAL MAMMARY ARTERY AND ENDOSCOPICALLY HARVESTED RIGHT GREATER SAPHENOUS VEIN (N/A) TRANSESOPHAGEAL ECHOCARDIOGRAM (TEE) (N/A)   1 tmax 99, VSS, SBP 116-145 range, atrial fib with CVR alt with sinus and freq PVC's. Will start po amio 2 sats good on RA 3 expected ABLA improved 4 Creat improved, Good UOP, weight stable, replace K+ 5 push routine pulm toilet and rehab  6 no DM - sugars a bit low nat times- will stop insulin  LOS: 7 days    John Giovanni PA-C Pager [phone] 06/16/2020   Chart reviewed, patient examined, agree with above. He had brief atrial flutter with RVR this am. Converted spontaneously. Baseline is sinus 70's with some trigeminy. Continue Lopressor. Amio started po this am. Keep pacing wires in for now in case he requires rapid atrial pacing. If rhythm remains stable he could probably go home Sunday.

## 2020-06-17 LAB — GLUCOSE, CAPILLARY
Glucose-Capillary: 91 mg/dL (ref 70–99)
Glucose-Capillary: 105 mg/dL — ABNORMAL HIGH (ref 70–99)
Glucose-Capillary: 133 mg/dL — ABNORMAL HIGH (ref 70–99)
Glucose-Capillary: 110 mg/dL — ABNORMAL HIGH (ref 70–99)

## 2020-06-17 MED ORDER — BENAZEPRIL HCL 5 MG PO TABS
10.0000 mg | ORAL_TABLET | Freq: Every day | ORAL | Status: DC
  Administered 2020-06-17: 12:00:00 10 mg via ORAL
  Filled 2020-06-17: qty 2

## 2020-06-17 NOTE — Progress Notes (Signed)
Patient EPW pulled per protocol and as ordered. All ends intact. Patient reminded to lie supine approximately one hour. Patient bp 161/74 heart rate 70. Will monitor patient. Hulbert Branscome, Bettina Gavia  rN

## 2020-06-17 NOTE — Progress Notes (Signed)
Patient assisted to chair this am, standby assist. Call bell within reach. Will monitor patient.Daphyne Miguez, Bettina Gavia RN

## 2020-06-17 NOTE — Progress Notes (Signed)
Mobility Specialist - Progress Note   06/17/20 1108  Mobility  Activity Ambulated in hall  Level of Assistance Minimal assist, patient does 75% or more  Assistive Device Front wheel walker  Distance Ambulated (ft) 340 ft  Mobility Response Tolerated well  Mobility performed by Mobility specialist  $Mobility charge 1 Mobility   Asx throughout ambulation. HR remained ~80. Pt in bed after walk to get wires removed.   Pricilla Handler Mobility Specialist Mobility Specialist Phone: [phone]

## 2020-06-17 NOTE — Progress Notes (Addendum)
[address]       [place] [address]             [phone]      5 Days Post-Op Procedure(s) (LRB): CORONARY ARTERY BYPASS GRAFTING (CABG), ON PUMP, TIMES FIVE, USING LEFT INTERNAL MAMMARY ARTERY AND ENDOSCOPICALLY HARVESTED RIGHT GREATER SAPHENOUS VEIN (N/A) TRANSESOPHAGEAL ECHOCARDIOGRAM (TEE) (N/A) Subjective: conts to feel better, maintaining sinus, occ PVC's  Objective: Vital signs in last 24 hours: Temp:  [97.9 F (36.6 C)-99.8 F (37.7 C)] 98.2 F (36.8 C) (12/18 0806) Pulse Rate:  [61-81] 68 (12/18 0938) Cardiac Rhythm: Normal sinus rhythm (12/18 0802) Resp:  [18] 18 (12/18 0806) BP: (133-161)/(61-78) 153/65 (12/18 0806) SpO2:  [90 %-96 %] 95 % (12/18 0806) Weight:  [86.7 kg] 86.7 kg (12/18 0639)  Hemodynamic parameters for last 24 hours:    Intake/Output from previous day: 12/17 0701 - 12/18 0700 In: 120 [P.O.:120] Out: 1025 [Urine:1025] Intake/Output this shift: No intake/output data recorded.  General appearance: alert, cooperative and no distress Heart: regular rate and rhythm Lungs: mildly dim in bases Abdomen: benign Extremities: no significant edema Wound: incis healing well  Lab Results: Recent Labs    06/15/20 0109 06/16/20 0034  WBC 8.8 9.9  HGB 9.4* 9.9*  HCT 27.3* 30.7*  PLT 124* 166   BMET:  Recent Labs    06/15/20 0109 06/16/20 0034  NA 138 137  K 3.7 3.6  CL 103 104  CO2 26 23  GLUCOSE 83 68*  BUN 27* 23  CREATININE 1.40* 1.25*  CALCIUM 8.2* 8.5*    PT/INR: No results for input(s): LABPROT, INR in the last 72 hours. ABG    Component Value Date/Time   PHART 7.401 06/12/2020 1959   HCO3 23.4 06/12/2020 1959   TCO2 25 06/12/2020 1959   ACIDBASEDEF 1.0 06/12/2020 1959   O2SAT 96.0 06/12/2020 1959   CBG (last 3)  Recent Labs    06/16/20 1644 06/16/20 2033 06/17/20 0619  GLUCAP 88 125* 91    Meds Scheduled Meds: . acetaminophen  1,000 mg Oral Q6H  . amiodarone  400 mg Oral BID  .  aspirin EC  325 mg Oral Daily   Or  . aspirin  324 mg Per Tube Daily  . bisacodyl  10 mg Oral Daily   Or  . bisacodyl  10 mg Rectal Daily  . Chlorhexidine Gluconate Cloth  6 each Topical Daily  . [place] [facility], Patient & Family Education   Does not apply Once  . docusate sodium  200 mg Oral Daily  . insulin aspart  0-24 Units Subcutaneous TID WC & HS  . mouth rinse  15 mL Mouth Rinse BID  . metoprolol tartrate  25 mg Oral BID   Or  . metoprolol tartrate  12.5 mg Per Tube BID  . pantoprazole  40 mg Oral Daily  . rosuvastatin  40 mg Oral Daily  . sodium chloride flush  10-40 mL Intracatheter Q12H  . sodium chloride flush  3 mL Intravenous Q12H   Continuous Infusions: . sodium chloride Stopped (06/13/20 0955)  . sodium chloride Stopped (06/13/20 1200)  . sodium chloride 10 mL/hr at 06/12/20 1435  . sodium chloride    . lactated ringers    . lactated ringers Stopped (06/14/20 0808)   PRN Meds:.sodium chloride, sodium chloride, metoprolol tartrate, ondansetron (ZOFRAN) IV, oxyCODONE, phenol, sodium chloride flush, sodium chloride flush, traMADol  Xrays No results found.  Assessment/Plan: S/P Procedure(s) (LRB):  CORONARY ARTERY BYPASS GRAFTING (CABG), ON PUMP, TIMES FIVE, USING LEFT INTERNAL MAMMARY ARTERY AND ENDOSCOPICALLY HARVESTED RIGHT GREATER SAPHENOUS VEIN (N/A) TRANSESOPHAGEAL ECHOCARDIOGRAM (TEE) (N/A)  1 Tmax 99.8, VSS, some systolic elevated readings, sinus rhythm- will restart lotensin at 10 mg and see how he does with control land creat 2 sats good on RA 3 good UOP, weight appears below preop 4 BS well controlled 5 no new labs- recheck in am 6 d/c EPW's today 7 home in am if no new issues  LOS: 8 days    John Giovanni PA-C Pager [phone] 06/17/2020  progressing well Poss home tomowrrow I have seen and examined Doreene Nest Bisson and agree with the above assessment  and plan.  Grace Isaac MD Beeper [phone] Office [phone] 06/17/2020  1:36 PM

## 2020-06-17 NOTE — Progress Notes (Signed)
CARDIAC REHAB PHASE I   Post op CABG instructions completed with patient and wife.  Both were very receptive, lots of questions answered by myself and Dr. Servando Snare.  Referred to phase II cardiac rehab @ [facility].  [id number]         Liliane Channel RN, BSN 06/17/2020 12:28 PM

## 2020-06-17 NOTE — Progress Notes (Addendum)
Patient ambulated in hallway with nursing staff 450 feet with rolling walker. Two standing rest breaks. Back to chair once in room. Call bell wiith in reach. Dyllan Hughett, Bettina Gavia RN

## 2020-06-18 DIAGNOSIS — I480 Paroxysmal atrial fibrillation: Secondary | ICD-10-CM

## 2020-06-18 DIAGNOSIS — I4891 Unspecified atrial fibrillation: Secondary | ICD-10-CM

## 2020-06-18 LAB — BASIC METABOLIC PANEL
Anion gap: 13 (ref 5–15)
BUN: 20 mg/dL (ref 8–23)
CO2: 25 mmol/L (ref 22–32)
Calcium: 9.1 mg/dL (ref 8.9–10.3)
Chloride: 103 mmol/L (ref 98–111)
Creatinine, Ser: 1.28 mg/dL — ABNORMAL HIGH (ref 0.61–1.24)
GFR, Estimated: 56 mL/min — ABNORMAL LOW (ref >60)
Glucose, Bld: 96 mg/dL (ref 70–99)
Potassium: 4.3 mmol/L (ref 3.5–5.1)
Sodium: 141 mmol/L (ref 135–145)

## 2020-06-18 LAB — CBC
HCT: 27.8 % — ABNORMAL LOW (ref 39.0–52.0)
Hemoglobin: 9.4 g/dL — ABNORMAL LOW (ref 13.0–17.0)
MCH: 32.5 pg (ref 26.0–34.0)
MCHC: 33.8 g/dL (ref 30.0–36.0)
MCV: 96.2 fL (ref 80.0–100.0)
Platelets: 218 K/uL (ref 150–400)
RBC: 2.89 MIL/uL — ABNORMAL LOW (ref 4.22–5.81)
RDW: 14.2 % (ref 11.5–15.5)
WBC: 6.1 K/uL (ref 4.0–10.5)
nRBC: 0 % (ref 0.0–0.2)

## 2020-06-18 LAB — GLUCOSE, CAPILLARY: Glucose-Capillary: 86 mg/dL (ref 70–99)

## 2020-06-18 MED ORDER — AMIODARONE HCL 200 MG PO TABS: 200.0000 mg | ORAL_TABLET | Freq: Two times a day (BID) | ORAL | 1 refills | Status: DC

## 2020-06-18 MED ORDER — METOPROLOL SUCCINATE ER 25 MG PO TB24: 25.0000 mg | ORAL_TABLET | Freq: Every day | ORAL | 1 refills | Status: DC

## 2020-06-18 MED ORDER — ROSUVASTATIN CALCIUM 40 MG PO TABS
40.0000 mg | ORAL_TABLET | Freq: Every day | ORAL | 1 refills | Status: AC
Start: 2020-06-18 — End: ?

## 2020-06-18 MED ORDER — TRAMADOL HCL 50 MG PO TABS: 50.0000 mg | ORAL_TABLET | Freq: Four times a day (QID) | ORAL | 0 refills | Status: DC | PRN

## 2020-06-18 MED ORDER — BENAZEPRIL HCL 5 MG PO TABS
40.0000 mg | ORAL_TABLET | Freq: Every day | ORAL | Status: DC
  Administered 2020-06-18: 09:00:00 40 mg via ORAL
  Filled 2020-06-18: qty 8

## 2020-06-18 MED ORDER — ASPIRIN 325 MG PO TBEC: 325.0000 mg | DELAYED_RELEASE_TABLET | Freq: Every day | ORAL | Status: DC

## 2020-06-18 NOTE — Progress Notes (Signed)
[address]       [place] [address]             [phone]      6 Days Post-Op Procedure(s) (LRB): CORONARY ARTERY BYPASS GRAFTING (CABG), ON PUMP, TIMES FIVE, USING LEFT INTERNAL MAMMARY ARTERY AND ENDOSCOPICALLY HARVESTED RIGHT GREATER SAPHENOUS VEIN (N/A) TRANSESOPHAGEAL ECHOCARDIOGRAM (TEE) (N/A) Subjective: Feels good  Objective: Vital signs in last 24 hours: Temp:  [97.7 F (36.5 C)-98.3 F (36.8 C)] 98 F (36.7 C) (12/19 0305) Pulse Rate:  [60-77] 60 (12/19 0305) Cardiac Rhythm: Normal sinus rhythm (12/18 1936) Resp:  [16-18] 18 (12/19 0305) BP: (122-161)/(60-95) 148/89 (12/19 0305) SpO2:  [92 %-99 %] 93 % (12/19 0305) Weight:  [87.2 kg] 87.2 kg (12/19 0305)  Hemodynamic parameters for last 24 hours:    Intake/Output from previous day: 12/18 0701 - 12/19 0700 In: 600 [P.O.:600] Out: 800 [Urine:800] Intake/Output this shift: No intake/output data recorded.  General appearance: alert, cooperative and no distress Heart: regular rate and rhythm Lungs: clear to auscultation bilaterally Abdomen: benign Extremities: no edema Wound: incis healing well  Lab Results: Recent Labs    06/16/20 0034 06/18/20 0056  WBC 9.9 6.1  HGB 9.9* 9.4*  HCT 30.7* 27.8*  PLT 166 218   BMET:  Recent Labs    06/16/20 0034 06/18/20 0056  NA 137 141  K 3.6 4.3  CL 104 103  CO2 23 25  GLUCOSE 68* 96  BUN 23 20  CREATININE 1.25* 1.28*  CALCIUM 8.5* 9.1    PT/INR: No results for input(s): LABPROT, INR in the last 72 hours. ABG    Component Value Date/Time   PHART 7.401 06/12/2020 1959   HCO3 23.4 06/12/2020 1959   TCO2 25 06/12/2020 1959   ACIDBASEDEF 1.0 06/12/2020 1959   O2SAT 96.0 06/12/2020 1959   CBG (last 3)  Recent Labs    06/17/20 1652 06/17/20 2145 06/18/20 0629  GLUCAP 133* 110* 86    Meds Scheduled Meds: . acetaminophen  1,000 mg Oral Q6H  . amiodarone  400 mg Oral BID  . aspirin EC  325 mg Oral Daily   Or  .  aspirin  324 mg Per Tube Daily  . benazepril  10 mg Oral Daily  . bisacodyl  10 mg Oral Daily   Or  . bisacodyl  10 mg Rectal Daily  . Chlorhexidine Gluconate Cloth  6 each Topical Daily  . [place] [facility], Patient & Family Education   Does not apply Once  . docusate sodium  200 mg Oral Daily  . insulin aspart  0-24 Units Subcutaneous TID WC & HS  . mouth rinse  15 mL Mouth Rinse BID  . metoprolol tartrate  25 mg Oral BID   Or  . metoprolol tartrate  12.5 mg Per Tube BID  . pantoprazole  40 mg Oral Daily  . rosuvastatin  40 mg Oral Daily  . sodium chloride flush  10-40 mL Intracatheter Q12H  . sodium chloride flush  3 mL Intravenous Q12H   Continuous Infusions: . sodium chloride Stopped (06/13/20 0955)  . sodium chloride Stopped (06/13/20 1200)  . sodium chloride 10 mL/hr at 06/12/20 1435  . sodium chloride    . lactated ringers    . lactated ringers Stopped (06/14/20 0808)   PRN Meds:.sodium chloride, sodium chloride, metoprolol tartrate, ondansetron (ZOFRAN) IV, oxyCODONE, phenol, sodium chloride flush, sodium chloride flush, traMADol  Xrays No results found.  Assessment/Plan: S/P Procedure(s) (  LRB): CORONARY ARTERY BYPASS GRAFTING (CABG), ON PUMP, TIMES FIVE, USING LEFT INTERNAL MAMMARY ARTERY AND ENDOSCOPICALLY HARVESTED RIGHT GREATER SAPHENOUS VEIN (N/A) TRANSESOPHAGEAL ECHOCARDIOGRAM (TEE) (N/A)   1 afeb, BP is elevated more frequently- will increase to home dose on ARB. Maintaining sinus rhythm on amio/metoprolol 2 sats good on RA 3 creat is stable at 1.2 4 BS adeq controlled 5 H/H slightly from previous-equalibrating, no evid of bleeding 6 weight stable, diuresing spontaneously, uncertain UOP as not accurate but appears to be adequate 7 no leukocytosis 9 stable for d/c  LOS: 9 days    John Giovanni PA-C Pager [phone] 06/18/2020

## 2020-06-18 NOTE — Progress Notes (Signed)
Patient and spouse given discharge instructions medication list and follow up appointments. Patient verbalized understanding IV and tele removed all questions answered. Will discharge home as ordered transported to exit via wheel chair and nursing staff. Jamilett Ferrante, Bettina Gavia RN

## 2020-06-18 NOTE — Plan of Care (Signed)

## 2020-06-20 ENCOUNTER — Other Ambulatory Visit: Payer: Self-pay

## 2020-06-20 DIAGNOSIS — I4891 Unspecified atrial fibrillation: Secondary | ICD-10-CM | POA: Diagnosis not present

## 2020-06-20 DIAGNOSIS — I129 Hypertensive chronic kidney disease with stage 1 through stage 4 chronic kidney disease, or unspecified chronic kidney disease: Secondary | ICD-10-CM | POA: Diagnosis not present

## 2020-06-20 DIAGNOSIS — Z87891 Personal history of nicotine dependence: Secondary | ICD-10-CM | POA: Diagnosis not present

## 2020-06-20 DIAGNOSIS — I25110 Atherosclerotic heart disease of native coronary artery with unstable angina pectoris: Secondary | ICD-10-CM | POA: Diagnosis not present

## 2020-06-20 DIAGNOSIS — N183 Chronic kidney disease, stage 3 unspecified: Secondary | ICD-10-CM | POA: Diagnosis not present

## 2020-06-20 DIAGNOSIS — M6281 Muscle weakness (generalized): Secondary | ICD-10-CM | POA: Diagnosis not present

## 2020-06-20 DIAGNOSIS — I2119 ST elevation (STEMI) myocardial infarction involving other coronary artery of inferior wall: Secondary | ICD-10-CM | POA: Diagnosis not present

## 2020-06-20 DIAGNOSIS — I10 Essential (primary) hypertension: Secondary | ICD-10-CM | POA: Diagnosis not present

## 2020-06-20 DIAGNOSIS — Z951 Presence of aortocoronary bypass graft: Secondary | ICD-10-CM | POA: Diagnosis not present

## 2020-06-20 DIAGNOSIS — Z48812 Encounter for surgical aftercare following surgery on the circulatory system: Secondary | ICD-10-CM | POA: Diagnosis not present

## 2020-06-20 MED ORDER — TRAMADOL HCL 50 MG PO TABS: 50.0000 mg | ORAL_TABLET | Freq: Four times a day (QID) | ORAL | 0 refills | Status: DC | PRN

## 2020-06-20 MED ORDER — TRAMADOL HCL 50 MG PO TABS: 50.0000 mg | ORAL_TABLET | Freq: Four times a day (QID) | ORAL | 0 refills | Status: AC | PRN

## 2020-06-20 NOTE — Progress Notes (Signed)
Patient's wife contacted the office stating that patient did not receive his Tramadol prescription when he was discharged from the hospital.  He is s/p CABG x5 with Dr. Cyndia Bent 06/12/20.  Prescription send into patient's pharmacy [facility]  Patient contacted and aware.

## 2020-06-21 ENCOUNTER — Other Ambulatory Visit: Payer: Self-pay

## 2020-06-21 ENCOUNTER — Ambulatory Visit (INDEPENDENT_AMBULATORY_CARE_PROVIDER_SITE_OTHER): Payer: Self-pay | Admitting: *Deleted

## 2020-06-21 DIAGNOSIS — Z951 Presence of aortocoronary bypass graft: Secondary | ICD-10-CM

## 2020-06-21 DIAGNOSIS — Z4802 Encounter for removal of sutures: Secondary | ICD-10-CM

## 2020-06-21 NOTE — Progress Notes (Signed)
Patient arrived for nurse visit to remove sutures post- CABG 06/12/20.  Three chest tube sutures removed with no signs or symptoms of infection noted.  Incisions well approximated. Patient tolerated suture removal well.  Patient and family instructed to keep the incision site clean and dry. Patient and family acknowledged instructions given.  All questions answered.

## 2020-06-22 DIAGNOSIS — M6281 Muscle weakness (generalized): Secondary | ICD-10-CM | POA: Diagnosis not present

## 2020-06-22 DIAGNOSIS — I129 Hypertensive chronic kidney disease with stage 1 through stage 4 chronic kidney disease, or unspecified chronic kidney disease: Secondary | ICD-10-CM | POA: Diagnosis not present

## 2020-06-22 DIAGNOSIS — N183 Chronic kidney disease, stage 3 unspecified: Secondary | ICD-10-CM | POA: Diagnosis not present

## 2020-06-22 DIAGNOSIS — I25110 Atherosclerotic heart disease of native coronary artery with unstable angina pectoris: Secondary | ICD-10-CM | POA: Diagnosis not present

## 2020-06-22 DIAGNOSIS — I2119 ST elevation (STEMI) myocardial infarction involving other coronary artery of inferior wall: Secondary | ICD-10-CM | POA: Diagnosis not present

## 2020-06-22 DIAGNOSIS — Z48812 Encounter for surgical aftercare following surgery on the circulatory system: Secondary | ICD-10-CM | POA: Diagnosis not present

## 2020-06-27 DIAGNOSIS — N183 Chronic kidney disease, stage 3 unspecified: Secondary | ICD-10-CM | POA: Diagnosis not present

## 2020-06-27 DIAGNOSIS — M6281 Muscle weakness (generalized): Secondary | ICD-10-CM | POA: Diagnosis not present

## 2020-06-27 DIAGNOSIS — I25110 Atherosclerotic heart disease of native coronary artery with unstable angina pectoris: Secondary | ICD-10-CM | POA: Diagnosis not present

## 2020-06-27 DIAGNOSIS — I2119 ST elevation (STEMI) myocardial infarction involving other coronary artery of inferior wall: Secondary | ICD-10-CM | POA: Diagnosis not present

## 2020-06-27 DIAGNOSIS — Z48812 Encounter for surgical aftercare following surgery on the circulatory system: Secondary | ICD-10-CM | POA: Diagnosis not present

## 2020-06-27 DIAGNOSIS — I129 Hypertensive chronic kidney disease with stage 1 through stage 4 chronic kidney disease, or unspecified chronic kidney disease: Secondary | ICD-10-CM | POA: Diagnosis not present

## 2020-07-06 NOTE — Progress Notes (Unsigned)
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 male with a history of PVC's, Near syncope, atypical chest pain, HTN, HLD.  Last encounter with Dr. Diona Browner 05/22/2020.  He had been referred by PCP for evaluation of ventricular bigeminy.  He had been evaluated at [facility] emergency room in September after episode of possible near syncope.  At that time he was noted to have ventricular bigeminy by EKG.  Echocardiogram October 2021 EF 55 to 60%.  Diastolic dysfunction, normal RV, mild aortic stenosis, trace MR mild TR. He had experienced no recurrent episodes of syncope or near syncope.  Had some recent episodes of disequilibrium and wondered whether he may have some inner ear contribution.  He felt most of these types of symptoms when bending over and standing up.  Had some occasional episodes of chest discomfort described as similar to indigestion.  Has a prior history of PUD with similar symptoms.  Previously treated for H. pylori in the past.  No prior ischemic work-up.  Family history of heart disease.  72-hour Zio patch was ordered to quantify PVCs.  Lexiscan Myoview was ordered for intermittent chest discomfort.  He was continuing a low dose beta-blocker.  He was continuing Crestor.  LDL was 134 in September.  Telephone encounter on 06/04/2020:  Recent stress Myoview was significantly abnormal indicating previous large infarct scar and moderate residual ischemia.  Therefore presence of underlying CAD.  Dr. Diona Browner requesting to schedule office follow-up to review stress results and discuss symptoms.  Most likely proceed with a diagnostic cardiac catheterization.  He was originally referred for frequent PVCs.   Patient was last here to discuss recent abnormal  stress test results and results of event monitor.  We discussed the fact that it appears he  had fairly significant MI in the past and he has some peri-infarct ischemia.  Patient states he knows something is wrong because his stamina his significantly decreased over the last months where he usually can only work for about 3 hours and has to rest about 6 hours before he can continue.  Also he and his wife state he had recent bouts of nausea which was something new that had been occurring.  Patient stated in the interim since last visit he had no sensation of palpitations or arrhythmias.  He stated he had to reduce the dose of Toprol XL due to feeling significantly tired.  Stated he continued to feel a little tired but not as much as when he was taking the 25 mg dosage twice daily.  We discussed the event monitor where he was having short runs of SVT.  He stated he never noted any sensation of palpitations during the event monitoring time.  He stated he was willing to undergo a cardiac catheterization based on the information.  Risk and benefits were thoroughly discussed during our conversation.   Here for follow-up status post recent CABG x 5 on 06/12/2020 Dr. Laneta Simmers secondary to abnormal stress test and significantly abnormal cardiac catheterization with multivessel disease present.  He underwent coronary artery bypass grafting x 5 on 06/12/2020: Left internal mammary artery graft to the LAD, SVG to Ramus, Sequential SVG to OM2 and OM3, SVG to PDA (LCX).  He states today he has been  doing reasonably well considering the circumstances.  Complains of only minor left anterior chest discomfort.  He is scheduled to see Dr. Cyndia Bent for follow-up soon.  He denies any anginal symptoms or significant dyspnea on exertion.  States he has been a little on the lazy side recently but has been attempting to walk more at home.  He has expectations of starting cardiac rehab soon and thinks Dr. Cyndia Bent will set that up at the next  appointment.  He denies any bleeding. He questions why he is on a higher dose of aspirin.  He states the median sternotomy and chest tube insertion site looks good except for 1 small reddened area on the lower part of the median sternotomy incision.  His right saphenous vein graft wounds look good per his statement.  He denies any palpitations or arrhythmias, orthostatic symptoms, CVA or TIA-like symptoms, PND, orthopnea.  Denies any DVT or PE-like symptoms, claudication-like symptoms, or lower extremity edema.   Past Medical History:  Diagnosis Date  . Bell's palsy   . CKD (chronic kidney disease) stage 3, GFR 30-59 ml/min (HCC)   . Essential hypertension   . Helicobacter pylori (H. pylori) infection   . History of GI bleed   . Mixed hyperlipidemia   . Nephrolithiasis     Past Surgical History:  Procedure Laterality Date  . APPENDECTOMY    . COLONOSCOPY  05/06/2012   Procedure: COLONOSCOPY;  Surgeon: Rogene Houston, MD;  [facility];  Service: Endoscopy;  Laterality: N/A;  730  . COLONOSCOPY N/A 11/03/2019   Procedure: COLONOSCOPY;  Surgeon: Rogene Houston, MD;  [facility];  Service: Endoscopy;  Laterality: N/A;  930-rescheduled 5/5 same time per office  . CORONARY ARTERY BYPASS GRAFT N/A 06/12/2020   Procedure: CORONARY ARTERY BYPASS GRAFTING (CABG), ON PUMP, TIMES FIVE, USING LEFT INTERNAL MAMMARY ARTERY AND ENDOSCOPICALLY HARVESTED RIGHT GREATER SAPHENOUS VEIN;  Surgeon: Gaye Pollack, MD;  Location: [place];  Service: Open Heart Surgery;  Laterality: N/A;  . HAMMER TOE SURGERY Left 07/09/2017   Procedure: HAMMER TOE CORRECTION 2ND DIGIT LEFT FOOT;  Surgeon: Caprice Beaver, DPM;  Location: [facility];  Service: Podiatry;  Laterality: Left;  . LEFT HEART CATH AND CORONARY ANGIOGRAPHY N/A 06/09/2020   Procedure: LEFT HEART CATH AND CORONARY ANGIOGRAPHY;  Surgeon: Troy Sine, MD;  Location: [place] [facility];  Service: Cardiovascular;  Laterality: N/A;  .  METATARSAL HEAD EXCISION Left 08/25/2019   Procedure: METATARSAL HEAD RESECTION OF THE 2ND AND 3RD METATARSAL LEFT FOOT;  Surgeon: Caprice Beaver, DPM;  Location[facility];  Service: Podiatry;  Laterality: Left;  . POLYPECTOMY  11/03/2019   Procedure: POLYPECTOMY;  Surgeon: Rogene Houston, MD;  [facility];  Service: Endoscopy;;  . TEE WITHOUT CARDIOVERSION N/A 06/12/2020   Procedure: TRANSESOPHAGEAL ECHOCARDIOGRAM (TEE);  Surgeon: Gaye Pollack, MD;  Location: [place];  Service: Open Heart Surgery;  Laterality: N/A;  . WEIL OSTEOTOMY Left 07/09/2017   Procedure: WEIL OSTEOTOMY 2ND METATARSAL LEFT FOOT;  Surgeon: Caprice Beaver, DPM;  Location: [facility];  Service: Podiatry;  Laterality: Left;    Current Outpatient Medications  Medication Sig Dispense Refill  . amiodarone (PACERONE) 200 MG tablet Take 1 tablet (200 mg total) by mouth 2 (two) times daily. 60 tablet 1  . ascorbic acid (VITAMIN C) 1000 MG tablet Take 1,000 mg by mouth daily.     Marland Kitchen aspirin EC 325 MG EC tablet Take 1 tablet (325 mg total)  by mouth daily.    . benazepril (LOTENSIN) 40 MG tablet Take 40 mg by mouth daily.   4  . Cholecalciferol (VITAMIN D3) 50 MCG (2000 UT) capsule Take 2,000 Units by mouth every evening.     . fenofibrate (TRICOR) 145 MG tablet Take 145 mg by mouth every evening.     . fish oil-omega-3 fatty acids 1000 MG capsule Take 1 g by mouth at bedtime.     . folic acid (FOLVITE) A999333 MCG tablet Take 400 mcg by mouth daily.     . Garlic 123XX123 MG CAPS Take 1,000 mg by mouth at bedtime.     Marland Kitchen Lysine 500 MG CAPS Take 500 mg by mouth daily.     . metoprolol succinate (TOPROL-XL) 25 MG 24 hr tablet Take 1 tablet (25 mg total) by mouth daily. 30 tablet 1  . Multiple Vitamin (MULTIVITAMIN WITH MINERALS) TABS Take 1 tablet by mouth daily.    . Multiple Vitamins-Minerals (PRESERVISION AREDS 2+MULTI VIT PO) Take 1 capsule by mouth 2 (two) times daily.    . rosuvastatin (CRESTOR) 40 MG tablet Take 1 tablet  (40 mg total) by mouth daily. 30 tablet 1  . traMADol (ULTRAM) 50 MG tablet Take 1 tablet (50 mg total) by mouth every 6 (six) hours as needed. 40 tablet 0  . zinc gluconate 50 MG tablet Take 50 mg by mouth every evening.      No current facility-administered medications for this visit.   Allergies:  Ciprofloxacin in d5w   Social History: The patient  reports that he quit smoking about 35 years ago. His smoking use included cigarettes and cigars. He has never used smokeless tobacco. He reports current alcohol use. He reports that he does not use drugs.   Family History: The patient's family history includes Breast cancer in his sister; COPD in his mother; Heart disease in his father; Kidney disease in his maternal grandmother; Melanoma in his brother; Stroke in his father.   ROS:  Please see the history of present illness. Otherwise, complete review of systems is positive for none.  All other systems are reviewed and negative.   Physical Exam: VS:  BP 132/62   Pulse 65   Ht 5\' 11"  (1.803 m)   Wt 188 lb (85.3 kg)   SpO2 95%   BMI 26.22 kg/m , BMI Body mass index is 26.22 kg/m.  Wt Readings from Last 3 Encounters:  07/07/20 188 lb (85.3 kg)  06/18/20 192 lb 3.9 oz (87.2 kg)  06/06/20 196 lb 6.4 oz (89.1 kg)    General: Patient appears comfortable at rest. Neck: Supple, no elevated JVP or carotid bruits, no thyromegaly. Lungs: Clear to auscultation, nonlabored breathing at rest. Cardiac: Regular rate and rhythm, no S3 or significant systolic murmur, no pericardial rub. Extremities: No pitting edema, distal pulses 2+. Skin: Warm and dry.  Median sternotomy site healing well except for 1 small minor reddened area on the lower part of the incision.  Chest tube insertion site pacer lead sites look good.  Right saphenous vein graft harvesting site looks well-healed. Musculoskeletal: No kyphosis. Neuropsychiatric: Alert and oriented x3, affect grossly appropriate.  ECG:  EKG June 13, 2020 normal sinus rhythm rate of 82, nonspecific ST and T wave abnormality  Recent Labwork: 03/24/2020: ALT 33; AST 37 06/13/2020: Magnesium 2.3 06/18/2020: BUN 20; Creatinine, Ser 1.28; Hemoglobin 9.4; Platelets 218; Potassium 4.3; Sodium 141  No results found for: CHOL, TRIG, HDL, CHOLHDL, VLDL, LDLCALC, LDLDIRECT  Other  Studies Reviewed Today:   Op report 06/12/2020 status post CABG Dr. Cyndia Bent 1. Median Sternotomy 2. Extracorporeal circulation 3.   Coronary artery bypass grafting x 5  Left internal mammary artery graft to the LAD SVG to Ramus Sequential SVG to OM2 and OM3 SVG to PDA (LCX) 4.   Endoscopic vein harvest from the right leg    Intra-Op TEE 06/12/2020 POST-OP IMPRESSIONS . Left Ventricle: The left ventricle is unchanged from pre-bypass. . Right Ventricle: The right ventricle appears unchanged from pre-bypass. . Aorta: The aorta appears unchanged from pre-bypass. . Left Atrium: The left atrium appears unchanged from pre-bypass. . Left Atrial Appendage: The left atrial appendage appears unchanged from pre-bypass. . Aortic Valve: The aortic valve appears unchanged from pre-bypass. . Mitral Valve: The mitral valve appears unchanged from pre-bypass. . Tricuspid Valve: The tricuspid valve appears unchanged from pre-bypass. . Interatrial Septum: The interatrial septum appears unchanged from pre-bypass. . Interventricular Septum: The interventricular septum appears unchanged from pre-bypass. . Pericardium: The pericardium appears unchanged from pre-bypass.   06/10/2020 PREOPERATIVE VASCULAR EVALUATION Indications: Pre-CABG. Risk Factors: Hypertension, hyperlipidemia, coronary artery disease. Comparison Study: No prior study on file Performing Technologist: Sharion Dove RVS Examination Guidelines: A complete evaluation includes B-mode imaging, spectral Doppler, color Doppler, and power Doppler as needed of all accessible portions of each vessel. Bilateral  testing is considered an integral part of a complete examination. Limited examinations for reoccurring indications may be performed as noted. PSV cm/s EDV cm/s Stenosis Describe Comments CCA Prox 101 23 heterogenous and irregular CCA Distal 75 12 heterogenous and irregular ICA Prox 109 14 1-39% heterogenous Shadowing ICA Mid 88 16 ICA Distal 88 17 ECA 141 14 PSV cm/s EDV cms Describe Arm Pressure Subclavian 92 Vertebral PSV cm/s 25 EDV cm/s 6 PSV cm/s EDV cm/s Stenosis Describe Comments CCA Prox 94 6 intimal thickening CCA Mid calcific CCA Distal 135 13 ICA Prox 69 19 1-39% heterogenous ICA Distal 68 17 ECA 128 2 Subclavian PSV cm/s EDV cm/s Describe Arm Pressure 114 Vertebral PSV cm/s 41 EDV cm/s 9  Zai R Wombles [id number] 06/10/2020 ABI Findings: Right Doppler Findings: Left Doppler Findings: Summary: Right Upper Extremity: Doppler waveform obliterate with right radial compression. Doppler waveforms remain within normal limits with right ulnar compression. Left Upper Extremity: Doppler waveform obliterate with left radial compression. Doppler waveforms remain within normal limits with left ulnar compression. Electronically signed by Ruta Hinds MD on 06/12/2020 at 7:44:59 PM. Right Rt Pressure (mmHg) Index Waveform Comment Brachial 130 triphasic PTA multiphasic DP multiphasic Left Lt Pressure (mmHg) Index Waveform Comment Brachial triphasic PTA multiphasic DP multiphasic Site Pressure Index Doppler Comments Brachial 130 triphasic Radial multiphasic Ulnar multiphasic Site Pressure Index Doppler Comments Brachial triphasic Radial multiphasic Ulnar multiphasic Right Carotid: Velocities in the right ICA are consistent with a 1-39% stenosis. Left Carotid: Velocities in the left ICA are consistent with a 1-39% stenosis. Vertebrals: Bilateral vertebral arteries demonstrate antegrade flow. Subclavians: Normal flow hemodynamics were seen in bilateral  subclavian arteries.     Echocardiogram 06/10/2020 1. Left ventricular ejection fraction, by estimation, is 55 to 60%. The  left ventricle has normal function. The left ventricle has no regional  wall motion abnormalities. There is mild concentric left ventricular  hypertrophy. Left ventricular diastolic  parameters are consistent with Grade I diastolic dysfunction (impaired  relaxation).  2. Right ventricular systolic function is normal. The right ventricular  size is normal. There is normal pulmonary artery systolic pressure. The  estimated right ventricular systolic pressure is  17.9 mmHg.  3. The mitral valve is normal in structure. Mild mitral valve  regurgitation. No evidence of mitral stenosis.  4. The aortic valve is normal in structure. There is severe calcifcation  of the aortic valve. There is severe thickening of the aortic valve.  Aortic valve regurgitation is trivial. Mild aortic valve stenosis. Aortic  valve mean gradient measures 11.0  mmHg.  5. The inferior vena cava is normal in size with greater than 50%  respiratory variability, suggesting right atrial pressure of 3 mmHg.       06/09/2020 LEFT HEART CATH AND CORONARY ANGIOGRAPHY   Conclusion    Prox RCA-1 lesion is 80% stenosed.  Prox RCA-2 lesion is 80% stenosed.  Mid RCA lesion is 95% stenosed.  Ost LAD to Prox LAD lesion is 95% stenosed.  Mid LAD lesion is 70% stenosed.  Ramus lesion is 85% stenosed.  LPAV-1 lesion is 90% stenosed.  LPAV-2 lesion is 100% stenosed.  Lat 2nd Mrg lesion is 80% stenosed.  2nd Mrg-1 lesion is 50% stenosed.  2nd Mrg-2 lesion is 90% stenosed.  Mid Cx lesion is 70% stenosed.  LV end diastolic pressure is normal.  The left ventricular systolic function is normal.   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   Mild LV dysfunction with EF estimated  At 40 - 45%. There is mild inferoapical and focal mid anterolateral hypocontractility. LVEDP 12 mmHg.  RECOMMENDATION: The patient will be admitted due to high-grade multivessel stenoses. Surgical consultation for CABG revascularization. With very high-grade ostial ID disease, will initiate heparin later this evening.   Diagnostic Dominance: Left     NST 06/02/2020  Study Result  Narrative & Impression   There was no ST segment deviation noted during stress.  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  The left ventricular ejection fraction is normal (55-65%).  Intermediate to high risk study based on size and degree of current ischemia.     Echocardiogram 04/17/2020: EF 55 to 60%.  Impaired LV relaxation.  Mild aortic stenosis, trace MR, mild TR.   Cardiac monitor 05/22/2020 Patient had a min HR of 43 bpm, max HR of 154 bpm, and avg HR of 68 bpm. Predominant underlying rhythm was Sinus Rhythm. 53 Supraventricular Tachycardia runs occurred, the run with the fastest interval lasting 10 beats with a max rate of 154 bpm, the longest lasting 19.5 secs with an avg rate of 98 bpm. Some episodes of Supraventricular Tachycardia may be possible Atrial Tachycardia with variable block. Isolated SVEs were occasional (1.2%, 3053), SVE Couplets were rare (<1.0%, 290), and SVE Triplets were rare (<1.0%, 83). Isolated VEs were frequent (10.8%,26499), VE Couplets were rare (<1.0%, 651), and no VE Triplets were present.Ventricular Bigeminy and Trigeminy were present.   Assessment  and Plan:   1.  Coronary artery bypass graft x5 06/12/2020/CAD Status post coronary artery bypass grafting x 5 on 06/12/2020: Left internal mammary artery graft to the LAD, SVG to Ramus, Sequential SVG to OM2 and OM3, SVG to PDA (LCX).  Denies any anginal or exertional symptoms.  Continue aspirin 325 mg daily.  Continue Toprol-XL 25 mg daily.   2. Atrial fibrillation Patient had some postop atrial fibrillation and was placed on amiodarone and continued on discharge. Continue amiodarone 200 mg p.o. twice daily. Currently not on anticoagulants other than 325 mg aspirin daily.  3. Essential hypertension Blood pressure today 132/62.  Continue benazepril 40 mg daily.  Continue Toprol-XL 25 mg p.o. daily.    4. Mixed hyperlipidemia Crestor was increased from 5 mg to 40 mg at hospital discharge continue. Continue Crestor 40 mg daily.  Continue fenofibrate 145 mg p.o. daily.  Medication Adjustments/Labs and Tests Ordered: Current medicines are reviewed at length with the patient today.  Concerns regarding medicines are outlined above.   Disposition: Follow-up with Dr. Domenic Polite or APP 3 months  Signed, Levell July, NP 07/07/2020 10:52 AM    [facility], [place], [place] [id number] Phone: [phone]; Fax: [phone]

## 2020-07-07 ENCOUNTER — Encounter: Payer: Self-pay | Admitting: Family Medicine

## 2020-07-07 ENCOUNTER — Ambulatory Visit: Payer: Medicare Other | Admitting: Family Medicine

## 2020-07-07 VITALS — BP 132/62 | HR 65 | Ht 71.0 in | Wt 188.0 lb

## 2020-07-07 DIAGNOSIS — I10 Essential (primary) hypertension: Secondary | ICD-10-CM | POA: Diagnosis not present

## 2020-07-07 DIAGNOSIS — I4891 Unspecified atrial fibrillation: Secondary | ICD-10-CM | POA: Diagnosis not present

## 2020-07-07 DIAGNOSIS — E782 Mixed hyperlipidemia: Secondary | ICD-10-CM | POA: Diagnosis not present

## 2020-07-07 DIAGNOSIS — Z951 Presence of aortocoronary bypass graft: Secondary | ICD-10-CM

## 2020-07-07 NOTE — Patient Instructions (Signed)
Medication Instructions:  Your physician recommends that you continue on your current medications as directed. Please refer to the Current Medication list given to you today.  *If you need a refill on your cardiac medications before your next appointment, please call your pharmacy*   Lab Work: None today If you have labs (blood work) drawn today and your tests are completely normal, you will receive your results only by: . MyChart Message (if you have MyChart) OR . A paper copy in the mail If you have any lab test that is abnormal or we need to change your treatment, we will call you to review the results.   Testing/Procedures: None today   Follow-Up: At [facility], you and your health needs are our priority.  As part of our continuing mission to provide you with exceptional heart care, we have created designated Provider Care Teams.  These Care Teams include your primary Cardiologist (physician) and Advanced Practice Providers (APPs -  Physician Assistants and Nurse Practitioners) who all work together to provide you with the care you need, when you need it.  We recommend signing up for the patient portal called "MyChart".  Sign up information is provided on this After Visit Summary.  MyChart is used to connect with patients for Virtual Visits (Telemedicine).  Patients are able to view lab/test results, encounter notes, upcoming appointments, etc.  Non-urgent messages can be sent to your provider as well.   To learn more about what you can do with MyChart, go to [email or web address].    Your next appointment:   3 month(s)  The format for your next appointment:   In Person  Provider:   Andy Quinn, NP   Other Instructions None      Thank you for choosing [facility] !         

## 2020-07-10 ENCOUNTER — Telehealth: Payer: Self-pay | Admitting: *Deleted

## 2020-07-10 ENCOUNTER — Other Ambulatory Visit: Payer: Self-pay | Admitting: *Deleted

## 2020-07-10 DIAGNOSIS — Z951 Presence of aortocoronary bypass graft: Secondary | ICD-10-CM

## 2020-07-10 MED ORDER — AMIODARONE HCL 200 MG PO TABS: 200.0000 mg | ORAL_TABLET | Freq: Every day | ORAL | 1 refills | Status: DC

## 2020-07-10 NOTE — Telephone Encounter (Signed)
-----   Message from Verta Ellen., NP sent at 07/09/2020  6:13 PM EST ----- Regarding: Amiodarone He has a follow up with Dr Cyndia Bent on Jan 12th. I discussed the fact with him that Dr Cyndia Bent would likely make the decision on whether to decrease the dose or stop at his follow up. I will have Alma Friendly to call him and have him decrease the Amiodarone dose to 200 mg QD. Thanks.  ----- Message ----- From: Satira Sark, MD Sent: 07/09/2020   9:32 AM EST To: Verta Ellen., NP  Patient discharged on December 19.  Amiodarone should be cut back to 200 mg daily.  Usually, we would only keep someone on amiodarone about 6 to 8 weeks post CABG presuming they have no further atrial fibrillation.  Need to figure out when this medicine can be stopped if he has no further atrial fibrillation, before he is seen back in 3 months. ----- Message ----- From: Verta Ellen., NP Sent: 07/07/2020   1:08 PM EST To: Satira Sark, MD

## 2020-07-12 ENCOUNTER — Encounter: Payer: Self-pay | Admitting: Surgery

## 2020-07-12 ENCOUNTER — Other Ambulatory Visit: Payer: Self-pay

## 2020-07-12 ENCOUNTER — Ambulatory Visit (INDEPENDENT_AMBULATORY_CARE_PROVIDER_SITE_OTHER): Payer: Self-pay | Admitting: Surgery

## 2020-07-12 ENCOUNTER — Ambulatory Visit
Admission: RE | Admit: 2020-07-12 | Discharge: 2020-07-12 | Disposition: A | Discharge status: Home / Self Care | Payer: Medicare Other | Source: Ambulatory Visit | Attending: Surgery | Admitting: Surgery

## 2020-07-12 VITALS — BP 140/72 | HR 69 | Temp 97.2°F | Resp 20 | Ht 71.0 in | Wt 189.2 lb

## 2020-07-12 DIAGNOSIS — Z951 Presence of aortocoronary bypass graft: Secondary | ICD-10-CM

## 2020-07-12 DIAGNOSIS — I700 Atherosclerosis of aorta: Secondary | ICD-10-CM | POA: Diagnosis not present

## 2020-07-12 DIAGNOSIS — R918 Other nonspecific abnormal finding of lung field: Secondary | ICD-10-CM | POA: Diagnosis not present

## 2020-07-12 DIAGNOSIS — J9811 Atelectasis: Secondary | ICD-10-CM | POA: Diagnosis not present

## 2020-07-12 NOTE — Progress Notes (Signed)
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 The patient's early postoperative recovery while in the hospital was notable for development of postoperative atrial fibrillation converted with amiodarone. Since hospital discharge the patient reports that he has been feeling well.  He is walking daily without chest pain or shortness of breath.  He saw cardiology in follow-up and the amiodarone dose has been decreased to 200 mg daily.   Current Outpatient Medications  Medication Sig Dispense Refill  . amiodarone (PACERONE) 200 MG tablet Take 1 tablet (200 mg total) by mouth daily. 30 tablet 1  . ascorbic acid (VITAMIN C) 1000 MG tablet Take 1,000 mg by mouth daily.     Marland Kitchen aspirin EC 325 MG EC tablet Take 1 tablet (325 mg total) by mouth daily.    . benazepril (LOTENSIN) 40 MG tablet Take 40 mg by mouth daily.   4  . Cholecalciferol (VITAMIN D3) 50 MCG (2000 UT) capsule Take 2,000 Units by mouth every evening.     . fenofibrate (TRICOR) 145 MG tablet Take 145 mg by mouth every evening.     . fish oil-omega-3 fatty acids 1000 MG capsule Take 1 g by mouth at bedtime.     . folic acid (FOLVITE) 161 MCG tablet Take 400 mcg by mouth daily.     . Garlic 0960 MG CAPS Take 1,000 mg by mouth at bedtime.     Marland Kitchen Lysine 500 MG CAPS Take 500 mg by mouth daily.     . metoprolol succinate (TOPROL-XL) 25 MG 24 hr tablet Take 1 tablet (25 mg total) by mouth daily. 30 tablet 1  . Multiple Vitamin (MULTIVITAMIN WITH MINERALS) TABS Take 1 tablet by mouth daily.    . Multiple Vitamins-Minerals (PRESERVISION AREDS 2+MULTI VIT PO) Take 1 capsule by mouth 2 (two) times daily.    . rosuvastatin (CRESTOR) 40 MG tablet Take 1 tablet (40 mg total) by mouth daily. 30 tablet 1  . traMADol (ULTRAM) 50 MG tablet Take 1 tablet (50 mg total) by mouth every 6 (six) hours as needed. 40 tablet 0  . zinc gluconate 50 MG tablet Take 50 mg by mouth  every evening.      No current facility-administered medications for this visit.    Physical Exam: BP 140/72 (BP Location: Right Arm, Patient Position: Sitting)   Pulse 69   Temp (!) 97.2 F (36.2 C)   Resp 20   Ht 5\' 11"  (1.803 m)   Wt 189 lb 3.2 oz (85.8 kg)   SpO2 98% Comment: RA with mask on  BMI 26.39 kg/m  He looks well. Cardiac exam shows a regular rate and rhythm with normal heart sounds. Lungs are clear. The chest incision is healing well and the sternum is stable.  The suture knot in the middle of the incision was protruding through the skin and I removed it. His right leg incision is healing well and there is no peripheral edema.  Diagnostic Tests:  CLINICAL DATA:  Post CABG on 06/12/2020  EXAM: CHEST - 2 VIEW  COMPARISON:  06/14/2020; 06/13/2020; 06/12/2020; 06/11/2020  FINDINGS: Grossly unchanged cardiac silhouette and mediastinal contours post median sternotomy and CABG. Atherosclerotic plaque within the thoracic aorta. There is persistent mild elevation/eventration of left hemidiaphragm with blunting of the left costophrenic angle. Improved aeration of the lung bases. Linear heterogeneous opacities within the left mid lung favored to represent atelectasis or scar. No new focal airspace opacities. No  definite pleural effusion or pneumothorax. No evidence of edema. No acute osseous abnormalities.  IMPRESSION: Improved aeration of the lung bases with persistent left basilar atelectasis/scar.   Electronically Signed   By: Sandi Mariscal M.D.   On: 07/12/2020 10:26  Impression:  Overall he is making a good recovery following coronary bypass surgery.  I told him that he can return to driving his car but should refrain from lifting anything heavier than 10 pounds for 3 months postoperatively.  He is looking forward to participating in cardiac rehab.  He appears to be maintaining sinus rhythm and I think he could get off of amiodarone when he goes back to  see cardiology.  Plan:  He will continue to follow-up with cardiology and will return to see me if he has any problems with his incisions.   Gaye Pollack, MD [facility] [phone]

## 2020-07-13 ENCOUNTER — Telehealth: Payer: Self-pay | Admitting: *Deleted

## 2020-07-13 NOTE — Telephone Encounter (Signed)
Patient's wife informed and verbalized understanding of plan. 

## 2020-07-13 NOTE — Telephone Encounter (Signed)
-----   Message from Satira Sark, MD sent at 07/12/2020  2:01 PM EST ----- Please see office note from Dr. Cyndia Bent.  We discussed reducing amiodarone to 200 mg recently.  Would plan on having him come into the office the first of February to get an ECG.  If he remains in sinus rhythm would stop amiodarone at that time. ----- Message ----- From: Gaye Pollack, MD Sent: 07/12/2020  12:43 PM EST To: Satira Sark, MD

## 2020-07-26 DIAGNOSIS — H353132 Nonexudative age-related macular degeneration, bilateral, intermediate dry stage: Secondary | ICD-10-CM | POA: Diagnosis not present

## 2020-07-27 DIAGNOSIS — I10 Essential (primary) hypertension: Secondary | ICD-10-CM | POA: Diagnosis not present

## 2020-07-27 DIAGNOSIS — I4891 Unspecified atrial fibrillation: Secondary | ICD-10-CM | POA: Diagnosis not present

## 2020-07-27 DIAGNOSIS — I25119 Atherosclerotic heart disease of native coronary artery with unspecified angina pectoris: Secondary | ICD-10-CM | POA: Diagnosis not present

## 2020-07-27 DIAGNOSIS — Z299 Encounter for prophylactic measures, unspecified: Secondary | ICD-10-CM | POA: Diagnosis not present

## 2020-08-02 ENCOUNTER — Ambulatory Visit (INDEPENDENT_AMBULATORY_CARE_PROVIDER_SITE_OTHER): Payer: No Typology Code available for payment source | Admitting: *Deleted

## 2020-08-02 ENCOUNTER — Other Ambulatory Visit: Payer: Self-pay

## 2020-08-02 DIAGNOSIS — I4891 Unspecified atrial fibrillation: Secondary | ICD-10-CM

## 2020-08-02 NOTE — Progress Notes (Signed)
Pt here for EKG per 1/13 phone note - pt denies any symptoms or complaints at this time - EKG done and will forward to provider - seemed to be NSR and pt aware to stop amiodarone - routed to provider for confirmation

## 2020-08-02 NOTE — Addendum Note (Signed)
Addended by: Julian Hy T on: 08/02/2020 02:48 PM   Modules accepted: Orders

## 2020-08-02 NOTE — Progress Notes (Signed)
Satira Sark, MD  Merlene Laughter, RN; Red Christians, Merceda Elks, CMA Results reviewed. He is in sinus rhythm. Would stop amiodarone at this point   Pt made aware

## 2020-08-21 ENCOUNTER — Encounter (HOSPITAL_COMMUNITY): Payer: Self-pay

## 2020-08-21 ENCOUNTER — Encounter (HOSPITAL_COMMUNITY)
Admission: RE | Admit: 2020-08-21 | Discharge: 2020-08-21 | Disposition: A | Discharge status: Home / Self Care | Payer: Medicare Other | Source: Ambulatory Visit | Attending: Cardiology | Admitting: Cardiology

## 2020-08-21 ENCOUNTER — Other Ambulatory Visit: Payer: Self-pay

## 2020-08-21 VITALS — BP 170/84 | HR 81 | Ht 71.0 in | Wt 200.2 lb

## 2020-08-21 DIAGNOSIS — Z951 Presence of aortocoronary bypass graft: Secondary | ICD-10-CM | POA: Insufficient documentation

## 2020-08-21 DIAGNOSIS — Z79899 Other long term (current) drug therapy: Secondary | ICD-10-CM | POA: Insufficient documentation

## 2020-08-21 DIAGNOSIS — Z7982 Long term (current) use of aspirin: Secondary | ICD-10-CM | POA: Diagnosis not present

## 2020-08-21 NOTE — Progress Notes (Signed)
Cardiac/Pulmonary Rehab Medication Review by a Pharmacist  Does the patient  feel that his/her medications are working for him/her?  yes  Has the patient been experiencing any side effects to the medications prescribed?  no  Does the patient measure his/her own blood pressure or blood glucose at home?  yes  Does the patient have any problems obtaining medications due to transportation or finances?   no  Understanding of regimen: excellent Understanding of indications: excellent Potential of compliance: excellent  Questions asked to Determine Patient Understanding of Medication Regimen:  1. What is the name of the medication?  2. What is the medication used for?  3. When should it be taken?  4. How much should be taken?  5. How will you take it?  6. What side effects should you report?  Understanding Defined as: Excellent: All questions above are correct Good: Questions 1-4 are correct Fair: Questions 1-2 are correct  Poor: 1 or none of the above questions are correct   Pharmacist comments: Patient presents today for cardiac rehab. We reviewed his medications concerned about EC ASA 325mg , wants to go back to 81mg . The patient is feeling well and tolerating his current regimen. He is ready to see what he can do in cardiac rehab. He does monitor his BP and HR periodically, but not daily now. Encouraged to monitor at least  Several times per week and if he feels odd, lightheaded. Continue current regimen.  Thanks for the opportunity to participate in the care of this patient,  Isac Sarna, BS [place], [place] Clinical Pharmacist Pager [phone] 08/21/2020 1:35 PM

## 2020-08-21 NOTE — Progress Notes (Signed)
Cardiac Individual Treatment Plan  Patient Details  Name: Carl Young MRN: [id number] Date of Birth: 04-29-1941 Referring Provider:   Flowsheet Row CARDIAC REHAB PHASE II ORIENTATION from 08/21/2020 in [place]  Referring Provider Dr. Cyndia Bent      Initial Encounter Date:  Flowsheet Row CARDIAC REHAB PHASE II ORIENTATION from 08/21/2020 in [place]  Date 08/21/20      Visit Diagnosis: S/P CABG x 5  Patient's Home Medications on Admission:  Current Outpatient Medications:  .  ascorbic acid (VITAMIN C) 1000 MG tablet, Take 1,000 mg by mouth daily. , Disp: , Rfl:  .  aspirin EC 325 MG EC tablet, Take 1 tablet (325 mg total) by mouth daily., Disp: , Rfl:  .  benazepril (LOTENSIN) 40 MG tablet, Take 40 mg by mouth daily. , Disp: , Rfl: 4 .  Cholecalciferol (VITAMIN D3) 50 MCG (2000 UT) capsule, Take 2,000 Units by mouth every evening. , Disp: , Rfl:  .  fenofibrate (TRICOR) 145 MG tablet, Take 145 mg by mouth every evening. , Disp: , Rfl:  .  fish oil-omega-3 fatty acids 1000 MG capsule, Take 1 g by mouth at bedtime. , Disp: , Rfl:  .  folic acid (FOLVITE) 782 MCG tablet, Take 400 mcg by mouth daily. , Disp: , Rfl:  .  Garlic 9562 MG CAPS, Take 1,000 mg by mouth at bedtime. , Disp: , Rfl:  .  Lysine 500 MG CAPS, Take 500 mg by mouth daily. , Disp: , Rfl:  .  metoprolol succinate (TOPROL-XL) 25 MG 24 hr tablet, Take 1 tablet (25 mg total) by mouth daily., Disp: 30 tablet, Rfl: 1 .  Multiple Vitamin (MULTIVITAMIN WITH MINERALS) TABS, Take 1 tablet by mouth daily., Disp: , Rfl:  .  Multiple Vitamins-Minerals (PRESERVISION AREDS 2+MULTI VIT PO), Take 1 capsule by mouth 2 (two) times daily., Disp: , Rfl:  .  rosuvastatin (CRESTOR) 40 MG tablet, Take 1 tablet (40 mg total) by mouth daily., Disp: 30 tablet, Rfl: 1 .  traMADol (ULTRAM) 50 MG tablet, Take 1 tablet (50 mg total) by mouth every 6 (six) hours as needed., Disp: 40 tablet, Rfl: 0 .   zinc gluconate 50 MG tablet, Take 50 mg by mouth every evening. , Disp: , Rfl:   Past Medical History: Past Medical History:  Diagnosis Date  . Bell's palsy   . CKD (chronic kidney disease) stage 3, GFR 30-59 ml/min (HCC)   . Essential hypertension   . Helicobacter pylori (H. pylori) infection   . History of GI bleed   . Mixed hyperlipidemia   . Nephrolithiasis     Tobacco Use: Social History   Tobacco Use  Smoking Status Former Smoker  . Types: Cigarettes, Cigars  . Quit date: 07/08/1985  . Years since quitting: 35.1  Smokeless Tobacco Never Used    Labs: Recent Review Flowsheet Data    Labs for ITP Cardiac and Pulmonary Rehab Latest Ref Rng & Units 06/12/2020 06/12/2020 06/12/2020 06/12/2020 06/12/2020   Hemoglobin A1c 4.8 - 5.6 % - - - - -   PHART 7.350 - 7.450 - - 7.398 7.397 7.401   PCO2ART 32.0 - 48.0 mmHg - - 38.8 37.9 38.2   HCO3 20.0 - 28.0 mmol/L - - 23.9 23.1 23.4   TCO2 22 - 32 mmol/L 26 24 25 24 25    ACIDBASEDEF 0.0 - 2.0 mmol/L - - 1.0 1.0 1.0   O2SAT % - - 94.0 97.0 96.0  Capillary Blood Glucose: Lab Results  Component Value Date   GLUCAP 86 06/18/2020   GLUCAP 110 (H) 06/17/2020   GLUCAP 133 (H) 06/17/2020   GLUCAP 105 (H) 06/17/2020   GLUCAP 91 06/17/2020     Exercise Target Goals: Exercise Program Goal: Individual exercise prescription set using results from initial 6 min walk test and THRR while considering  patient's activity barriers and safety.   Exercise Prescription Goal: Starting with aerobic activity 30 plus minutes a day, 3 days per week for initial exercise prescription. Provide home exercise prescription and guidelines that participant acknowledges understanding prior to discharge.  Activity Barriers & Risk Stratification:   6 Minute Walk:  6 Minute Walk    Row Name 08/21/20 1427         6 Minute Walk   Phase Initial     Distance 1000 feet     Walk Time 6 minutes     # of Rest Breaks 0     MPH 1.9     METS 1.27      RPE 10     VO2 Peak 4.43     Symptoms No     Resting HR 81 bpm     Resting BP 170/84     Resting Oxygen Saturation  96 %     Exercise Oxygen Saturation  during 6 min walk 97 %     Max Ex. HR 88 bpm     Max Ex. BP 188/82     2 Minute Post BP 166/80            Oxygen Initial Assessment:   Oxygen Re-Evaluation:   Oxygen Discharge (Final Oxygen Re-Evaluation):   Initial Exercise Prescription:  Initial Exercise Prescription - 08/21/20 1400      Date of Initial Exercise RX and Referring Provider   Date 08/21/20    Referring Provider Dr. Cyndia Bent    Expected Discharge Date 11/15/20      NuStep   Level 1    SPM 80    Minutes 39      Prescription Details   Frequency (times per week) 3    Duration Progress to 30 minutes of continuous aerobic without signs/symptoms of physical distress      Intensity   THRR 40-80% of Max Heartrate 55-110    Ratings of Perceived Exertion 11-15      Progression   Progression Continue to progress workloads to maintain intensity without signs/symptoms of physical distress.      Resistance Training   Training Prescription Yes    Weight 3    Reps 10-15           Perform Capillary Blood Glucose checks as needed.  Exercise Prescription Changes:   Exercise Comments:   Exercise Goals and Review:   Exercise Goals    Row Name 08/21/20 1430             Exercise Goals   Increase Physical Activity Yes       Intervention Develop an individualized exercise prescription for aerobic and resistive training based on initial evaluation findings, risk stratification, comorbidities and participant's personal goals.;Provide advice, education, support and counseling about physical activity/exercise needs.       Expected Outcomes Long Term: Add in home exercise to make exercise part of routine and to increase amount of physical activity.;Short Term: Attend rehab on a regular basis to increase amount of physical activity.;Long Term: Exercising  regularly at least 3-5 days a week.  Increase Strength and Stamina Yes       Intervention Provide advice, education, support and counseling about physical activity/exercise needs.;Develop an individualized exercise prescription for aerobic and resistive training based on initial evaluation findings, risk stratification, comorbidities and participant's personal goals.       Expected Outcomes Short Term: Increase workloads from initial exercise prescription for resistance, speed, and METs.;Short Term: Perform resistance training exercises routinely during rehab and add in resistance training at home;Long Term: Improve cardiorespiratory fitness, muscular endurance and strength as measured by increased METs and functional capacity (6MWT)       Able to understand and use rate of perceived exertion (RPE) scale Yes       Intervention Provide education and explanation on how to use RPE scale       Expected Outcomes Short Term: Able to use RPE daily in rehab to express subjective intensity level;Long Term:  Able to use RPE to guide intensity level when exercising independently       Knowledge and understanding of Target Heart Rate Range (THRR) Yes       Intervention Provide education and explanation of THRR including how the numbers were predicted and where they are located for reference       Expected Outcomes Short Term: Able to state/look up THRR;Long Term: Able to use THRR to govern intensity when exercising independently;Short Term: Able to use daily as guideline for intensity in rehab       Able to check pulse independently Yes       Intervention Provide education and demonstration on how to check pulse in carotid and radial arteries.;Review the importance of being able to check your own pulse for safety during independent exercise       Expected Outcomes Short Term: Able to explain why pulse checking is important during independent exercise;Long Term: Able to check pulse independently and accurately        Understanding of Exercise Prescription Yes       Intervention Provide education, explanation, and written materials on patient's individual exercise prescription       Expected Outcomes Short Term: Able to explain program exercise prescription;Long Term: Able to explain home exercise prescription to exercise independently              Exercise Goals Re-Evaluation :    Discharge Exercise Prescription (Final Exercise Prescription Changes):   Nutrition:  Target Goals: Understanding of nutrition guidelines, daily intake of sodium 1500mg , cholesterol 200mg , calories 30% from fat and 7% or less from saturated fats, daily to have 5 or more servings of fruits and vegetables.  Biometrics:  Pre Biometrics - 08/21/20 1431      Pre Biometrics   Height 5\' 11"  (1.803 m)    Weight 90.8 kg    Waist Circumference 41.5 inches    Hip Circumference 42.75 inches    Waist to Hip Ratio 0.97 %    BMI (Calculated) 27.93    Triceps Skinfold 8 mm    % Body Fat 31.2 %    Grip Strength 40 kg    Flexibility 10 in    Single Leg Stand 2.63 seconds            Nutrition Therapy Plan and Nutrition Goals:  Nutrition Therapy & Goals - 08/21/20 1430      Personal Nutrition Goals   Comments Patient scored 114 on his diet assessment. He is following a no added salt diet and tries to buy low sodium. He says he has also cut  back on portion sizes. Assessment score discussed and handout provided and discussed regarding making healthier choices.      Intervention Plan   Intervention Nutrition handout(s) given to patient.           Nutrition Assessments:  Nutrition Assessments - 08/21/20 1432      MEDFICTS Scores   Pre Score 114          MEDIFICTS Score Key:  ?70 Need to make dietary changes   40-70 Heart Healthy Diet  ? 40 Therapeutic Level Cholesterol Diet   Picture Your Plate Scores:  <32 Unhealthy dietary pattern with much room for improvement.  41-50 Dietary pattern unlikely  to meet recommendations for good health and room for improvement.  51-60 More healthful dietary pattern, with some room for improvement.   >60 Healthy dietary pattern, although there may be some specific behaviors that could be improved.    Nutrition Goals Re-Evaluation:   Nutrition Goals Discharge (Final Nutrition Goals Re-Evaluation):   Psychosocial: Target Goals: Acknowledge presence or absence of significant depression and/or stress, maximize coping skills, provide positive support system. Participant is able to verbalize types and ability to use techniques and skills needed for reducing stress and depression.  Initial Review & Psychosocial Screening:  Initial Psych Review & Screening - 08/21/20 1435      Initial Review   Current issues with None Identified      Family Dynamics   Good Support System? Yes    Comments Patient says his wife is his support system. He was very active prior to his CABG procedure and is looking forward to getting back to his activities. He denies any depression, anxiety, or stress. He has a very positive outlook regarding his future and is looking forward to participating in the program.      Barriers   Psychosocial barriers to participate in program There are no identifiable barriers or psychosocial needs.      Screening Interventions   Interventions Encouraged to exercise;To provide support and resources with identified psychosocial needs    Expected Outcomes Short Term goal: Identification and review with participant of any Quality of Life or Depression concerns found by scoring the questionnaire.           Quality of Life Scores:  Scores of 19 and below usually indicate a poorer quality of life in these areas.  A difference of  2-3 points is a clinically meaningful difference.  A difference of 2-3 points in the total score of the Quality of Life Index has been associated with significant improvement in overall quality of life, self-image,  physical symptoms, and general health in studies assessing change in quality of life.  PHQ-9: Recent Review Flowsheet Data    Depression screen [facility] 2/9 08/21/2020   Decreased Interest 0   Down, Depressed, Hopeless 0   PHQ - 2 Score 0   Altered sleeping 0   Tired, decreased energy 1   Change in appetite 0   Feeling bad or failure about yourself  0   Trouble concentrating 0   Moving slowly or fidgety/restless 0   Suicidal thoughts 0   PHQ-9 Score 1   Difficult doing work/chores Not difficult at all     Interpretation of Total Score  Total Score Depression Severity:  1-4 = Minimal depression, 5-9 = Mild depression, 10-14 = Moderate depression, 15-19 = Moderately severe depression, 20-27 = Severe depression   Psychosocial Evaluation and Intervention:  Psychosocial Evaluation - 08/21/20 1438  Psychosocial Evaluation & Interventions   Interventions Encouraged to exercise with the program and follow exercise prescription;Stress management education;Relaxation education    Comments Patient's initial QOL score was 28.14 overall and his PHQ-9 score was 1. He has no psychosocial issues identified at his orientation visit.    Expected Outcomes Patient will have no psychosocial issues identified at discharge.    Continue Psychosocial Services  No Follow up required           Psychosocial Re-Evaluation:   Psychosocial Discharge (Final Psychosocial Re-Evaluation):   Vocational Rehabilitation: Provide vocational rehab assistance to qualifying candidates.   Vocational Rehab Evaluation & Intervention:  Vocational Rehab - 08/21/20 1432      Initial Vocational Rehab Evaluation & Intervention   Assessment shows need for Vocational Rehabilitation No      Vocational Rehab Re-Evaulation   Comments Patient is retired and is not interested in returning to work. He does not need vocational rehab.           Education: Education Goals: Education classes will be provided on a weekly  basis, covering required topics. Participant will state understanding/return demonstration of topics presented.  Learning Barriers/Preferences:  Learning Barriers/Preferences - 08/21/20 1432      Learning Barriers/Preferences   Learning Barriers None    Learning Preferences Written Material;Audio           Education Topics: Hypertension, Hypertension Reduction -Define heart disease and high blood pressure. Discus how high blood pressure affects the body and ways to reduce high blood pressure.   Exercise and Your Heart -Discuss why it is important to exercise, the FITT principles of exercise, normal and abnormal responses to exercise, and how to exercise safely.   Angina -Discuss definition of angina, causes of angina, treatment of angina, and how to decrease risk of having angina.   Cardiac Medications -Review what the following cardiac medications are used for, how they affect the body, and side effects that may occur when taking the medications.  Medications include Aspirin, Beta blockers, calcium channel blockers, ACE Inhibitors, angiotensin receptor blockers, diuretics, digoxin, and antihyperlipidemics.   Congestive Heart Failure -Discuss the definition of CHF, how to live with CHF, the signs and symptoms of CHF, and how keep track of weight and sodium intake.   Heart Disease and Intimacy -Discus the effect sexual activity has on the heart, how changes occur during intimacy as we age, and safety during sexual activity.   Smoking Cessation / COPD -Discuss different methods to quit smoking, the health benefits of quitting smoking, and the definition of COPD.   Nutrition I: Fats -Discuss the types of cholesterol, what cholesterol does to the heart, and how cholesterol levels can be controlled.   Nutrition II: Labels -Discuss the different components of food labels and how to read food label   Heart Parts/Heart Disease and PAD -Discuss the anatomy of the heart, the  pathway of blood circulation through the heart, and these are affected by heart disease.   Stress I: Signs and Symptoms -Discuss the causes of stress, how stress may lead to anxiety and depression, and ways to limit stress.   Stress II: Relaxation -Discuss different types of relaxation techniques to limit stress.   Warning Signs of Stroke / TIA -Discuss definition of a stroke, what the signs and symptoms are of a stroke, and how to identify when someone is having stroke.   Knowledge Questionnaire Score:  Knowledge Questionnaire Score - 08/21/20 1432      Knowledge Questionnaire Score  Pre Score 22/24           Core Components/Risk Factors/Patient Goals at Admission:  Personal Goals and Risk Factors at Admission - 08/21/20 1433      Core Components/Risk Factors/Patient Goals on Admission    Weight Management Weight Maintenance    Lipids Yes    Intervention Provide education and support for participant on nutrition & aerobic/resistive exercise along with prescribed medications to achieve LDL 70mg , HDL >40mg .    Expected Outcomes Short Term: Participant states understanding of desired cholesterol values and is compliant with medications prescribed. Participant is following exercise prescription and nutrition guidelines.    Personal Goal Other Yes    Personal Goal Get stronger; have more energy. Get back to where he was before his surgery with his activities and energy levels.    Intervention Provide monitored exercise, blood pressure monitoring and education regarding lifestyle changes for risk modification.    Expected Outcomes Patient will completing the program meeting both program and personal goals.           Core Components/Risk Factors/Patient Goals Review:    Core Components/Risk Factors/Patient Goals at Discharge (Final Review):    ITP Comments:   Comments: Patient arrived for 1st visit/orientation/education at 1230. Patient was referred to CR by Gilford Raid due to CABGx5. During orientation advised patient on arrival and appointment times what to wear, what to do before, during and after exercise. Reviewed attendance and class policy.  Pt is scheduled to return Cardiac Rehab on 08/25/20 at 11:00. Pt was advised to come to class 15 minutes before class starts.  Discussed RPE/Dpysnea scales. Patient participated in warm up stretches. Patient was able to complete 6 minute walk test.  Telemetry:NSR. Patient was measured for the equipment. Discussed equipment safety with patient. Took patient pre-anthropometric measurements. Patient finished visit at 1415 .

## 2020-08-25 ENCOUNTER — Other Ambulatory Visit: Payer: Self-pay

## 2020-08-25 ENCOUNTER — Encounter (HOSPITAL_COMMUNITY)
Admission: RE | Admit: 2020-08-25 | Discharge: 2020-08-25 | Disposition: A | Discharge status: Home / Self Care | Payer: Medicare Other | Source: Ambulatory Visit | Attending: Cardiology | Admitting: Cardiology

## 2020-08-25 DIAGNOSIS — Z7982 Long term (current) use of aspirin: Secondary | ICD-10-CM | POA: Diagnosis not present

## 2020-08-25 DIAGNOSIS — Z79899 Other long term (current) drug therapy: Secondary | ICD-10-CM | POA: Diagnosis not present

## 2020-08-25 DIAGNOSIS — Z951 Presence of aortocoronary bypass graft: Secondary | ICD-10-CM | POA: Diagnosis not present

## 2020-08-25 LAB — GLUCOSE, CAPILLARY
Glucose-Capillary: 112 mg/dL — ABNORMAL HIGH (ref 70–99)
Glucose-Capillary: 89 mg/dL (ref 70–99)

## 2020-08-25 NOTE — Progress Notes (Signed)
Daily Session Note  Patient Details  Name: Carl Young MRN: [id number] Date of Birth: [birth year] Referring Provider:   Flowsheet Row CARDIAC REHAB PHASE II ORIENTATION from 08/21/2020 in [place]  Referring Provider Dr. Cyndia Bent      Encounter Date: 08/25/2020  Check In:  Session Check In - 08/25/20 1100      Check-In   Supervising physician immediately available to respond to emergencies [facility] MD immediately available    Physician(s) Dr. Domenic Polite    Location [facility]    Staff Present Cathren Harsh, MS, Exercise Physiologist;Dalton Kris Mouton, MS, ACSM-CEP, Exercise Physiologist    Virtual Visit No    Medication changes reported     No    Fall or balance concerns reported    No    Tobacco Cessation No Change    Warm-up and Cool-down Not performed (comment)   Low blood sugar   Resistance Training Performed No    VAD Patient? No    PAD/SET Patient? No      Pain Assessment   Currently in Pain? No/denies    Pain Score 0-No pain    Multiple Pain Sites No           Capillary Blood Glucose: No results found for this or any previous visit (from the past 24 hour(s)).    Social History   Tobacco Use  Smoking Status Former Smoker  . Types: Cigarettes, Cigars  . Quit date: 07/08/1985  . Years since quitting: 35.1  Smokeless Tobacco Never Used    Goals Met:  Independence with exercise equipment Exercise tolerated well No report of cardiac concerns or symptoms Strength training completed today  Goals Unmet:  Not Applicable  Comments: check out 1200   Dr. Kathie Dike is Medical Director for [facility] Pulmonary Rehab.

## 2020-08-28 ENCOUNTER — Other Ambulatory Visit: Payer: Self-pay

## 2020-08-28 ENCOUNTER — Encounter (HOSPITAL_COMMUNITY)
Admission: RE | Admit: 2020-08-28 | Discharge: 2020-08-28 | Disposition: A | Discharge status: Home / Self Care | Payer: Medicare Other | Source: Ambulatory Visit | Attending: Cardiology | Admitting: Cardiology

## 2020-08-28 DIAGNOSIS — Z951 Presence of aortocoronary bypass graft: Secondary | ICD-10-CM | POA: Diagnosis not present

## 2020-08-28 DIAGNOSIS — Z7982 Long term (current) use of aspirin: Secondary | ICD-10-CM | POA: Diagnosis not present

## 2020-08-28 DIAGNOSIS — Z79899 Other long term (current) drug therapy: Secondary | ICD-10-CM | POA: Diagnosis not present

## 2020-08-28 NOTE — Progress Notes (Signed)
Daily Session Note  Patient Details  Name: CHRISTIEN Young MRN: [id number] Date of Birth: [birth year] Referring Provider:   Flowsheet Row CARDIAC REHAB PHASE II ORIENTATION from 08/21/2020 in [place]  Referring Provider Dr. Cyndia Bent      Encounter Date: 08/28/2020  Check In:  Session Check In - 08/28/20 1100      Check-In   Supervising physician immediately available to respond to emergencies [facility] MD immediately available    Physician(s) Dr. Harl Bowie    Location [facility]    Staff Present Cathren Harsh, MS, Exercise Physiologist;Debra Wynetta Emery, RN, BSN    Virtual Visit No    Medication changes reported     No    Fall or balance concerns reported    No    Tobacco Cessation No Change    Warm-up and Cool-down Performed as group-led instruction    Resistance Training Performed Yes    VAD Patient? No    PAD/SET Patient? No      Pain Assessment   Currently in Pain? No/denies    Pain Score 0-No pain    Multiple Pain Sites No           Capillary Blood Glucose: No results found for this or any previous visit (from the past 24 hour(s)).    Social History   Tobacco Use  Smoking Status Former Smoker  . Types: Cigarettes, Cigars  . Quit date: 07/08/1985  . Years since quitting: 35.1  Smokeless Tobacco Never Used    Goals Met:  Independence with exercise equipment Exercise tolerated well No report of cardiac concerns or symptoms Strength training completed today  Goals Unmet:  Not Applicable  Comments: check out 1200   Dr. Kathie Dike is Medical Director for [facility] Pulmonary Rehab.

## 2020-08-30 ENCOUNTER — Encounter (HOSPITAL_COMMUNITY)
Admission: RE | Admit: 2020-08-30 | Discharge: 2020-08-30 | Disposition: A | Discharge status: Home / Self Care | Payer: Medicare Other | Source: Ambulatory Visit | Attending: Cardiology | Admitting: Cardiology

## 2020-08-30 ENCOUNTER — Other Ambulatory Visit: Payer: Self-pay

## 2020-08-30 DIAGNOSIS — Z951 Presence of aortocoronary bypass graft: Secondary | ICD-10-CM | POA: Insufficient documentation

## 2020-08-30 NOTE — Progress Notes (Signed)
Daily Session Note  Patient Details  Name: Carl Young MRN: [id number] Date of Birth: 1940-12-25 Referring Provider:   Flowsheet Row CARDIAC REHAB PHASE II ORIENTATION from 08/21/2020 in [place]  Referring Provider Dr. Cyndia Bent      Encounter Date: 08/30/2020  Check In:  Session Check In - 08/30/20 1115      Check-In   Supervising physician immediately available to respond to emergencies [facility] MD immediately available    Physician(s) Dr. Harl Bowie    Location [facility]    Staff Present Cathren Harsh, MS, Exercise Physiologist;Fabrizio Filip Wynetta Emery, RN, BSN    Virtual Visit No    Medication changes reported     No    Fall or balance concerns reported    No    Tobacco Cessation No Change    Warm-up and Cool-down Performed as group-led instruction    Resistance Training Performed Yes    VAD Patient? No    PAD/SET Patient? No      Pain Assessment   Currently in Pain? No/denies    Pain Score 0-No pain    Multiple Pain Sites No           Capillary Blood Glucose: No results found for this or any previous visit (from the past 24 hour(s)).    Social History   Tobacco Use  Smoking Status Former Smoker  . Types: Cigarettes, Cigars  . Quit date: 07/08/1985  . Years since quitting: 35.1  Smokeless Tobacco Never Used    Goals Met:  Independence with exercise equipment Exercise tolerated well No report of cardiac concerns or symptoms Strength training completed today  Goals Unmet:  Not Applicable  Comments: Check out 1200.   Dr. Kathie Dike is Medical Director for [facility] Pulmonary Rehab.

## 2020-09-01 ENCOUNTER — Telehealth: Payer: Self-pay | Admitting: *Deleted

## 2020-09-01 ENCOUNTER — Other Ambulatory Visit: Payer: Self-pay

## 2020-09-01 ENCOUNTER — Encounter (HOSPITAL_COMMUNITY)
Admission: RE | Admit: 2020-09-01 | Discharge: 2020-09-01 | Disposition: A | Discharge status: Home / Self Care | Payer: Medicare Other | Source: Ambulatory Visit | Attending: Cardiology | Admitting: Cardiology

## 2020-09-01 DIAGNOSIS — Z951 Presence of aortocoronary bypass graft: Secondary | ICD-10-CM

## 2020-09-01 MED ORDER — AMLODIPINE BESYLATE 5 MG PO TABS: 5.0000 mg | ORAL_TABLET | Freq: Every day | ORAL | 1 refills | Status: DC

## 2020-09-01 NOTE — Telephone Encounter (Signed)
Patient informed and verbalized understanding of plan. 

## 2020-09-01 NOTE — Progress Notes (Signed)
Daily Session Note  Patient Details  Name: Carl Young MRN: [id number] Date of Birth: 08-Jan-1941 Referring Provider:   Flowsheet Row CARDIAC REHAB PHASE II ORIENTATION from 08/21/2020 in [place]  Referring Provider Dr. Cyndia Bent      Encounter Date: 09/01/2020  Check In:  Session Check In - 09/01/20 1100      Check-In   Supervising physician immediately available to respond to emergencies [facility] MD immediately available    Physician(s) Dr. Domenic Polite    Location [facility]    Staff Present Cathren Harsh, MS, Exercise Physiologist;Debra Wynetta Emery, RN, BSN    Virtual Visit No    Medication changes reported     No    Fall or balance concerns reported    No    Tobacco Cessation No Change    Warm-up and Cool-down Performed as group-led instruction    Resistance Training Performed Yes    VAD Patient? No    PAD/SET Patient? No      Pain Assessment   Currently in Pain? No/denies    Pain Score 0-No pain    Multiple Pain Sites No           Capillary Blood Glucose: No results found for this or any previous visit (from the past 24 hour(s)).    Social History   Tobacco Use  Smoking Status Former Smoker  . Types: Cigarettes, Cigars  . Quit date: 07/08/1985  . Years since quitting: 35.1  Smokeless Tobacco Never Used    Goals Met:  Independence with exercise equipment Exercise tolerated well No report of cardiac concerns or symptoms Strength training completed today  Goals Unmet:  Not Applicable  Comments: check out 1200   Dr. Kathie Dike is Medical Director for [facility].

## 2020-09-01 NOTE — Telephone Encounter (Signed)
-----   Message from Satira Sark, MD sent at 09/01/2020  2:04 PM EST ----- Thank you for the update.  I have not actually seen him since before his surgery.  He has had follow-up with Jonni Sanger in [place].  If his current medication list is correct, he is no longer on Norvasc which he had been taking previously.  I will forward this to my [facility] nurse to see if we can get him started on Norvasc 5 mg daily, that may be helpful. ----- Message ----- From: Dwana Melena, RN Sent: 09/01/2020   1:44 PM EST To: Satira Sark, MD  Hey Dr. 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 He has completed 5 sessions. His blood pressure is averaging 277 to 824 systolic resting. He goes up to 235 systolic and 85 to 90 diastolic exercising on the nustep. I talked to him today about reaching out to Dr. Woody Seller regarding his readings and he requested I contact you. I understand he did not tolerate a higher dosage of metoprolol in the past. I know Mr. Alperin was very active prior to his CABG and he wants to get back to his level of activity and I was concerned with his readings.   Jackelyn Poling

## 2020-09-04 ENCOUNTER — Encounter (HOSPITAL_COMMUNITY)
Admission: RE | Admit: 2020-09-04 | Discharge: 2020-09-04 | Disposition: A | Discharge status: Home / Self Care | Payer: Medicare Other | Source: Ambulatory Visit | Attending: Cardiology | Admitting: Cardiology

## 2020-09-04 ENCOUNTER — Other Ambulatory Visit: Payer: Self-pay

## 2020-09-04 VITALS — Wt 200.0 lb

## 2020-09-04 DIAGNOSIS — Z951 Presence of aortocoronary bypass graft: Secondary | ICD-10-CM | POA: Diagnosis not present

## 2020-09-04 NOTE — Progress Notes (Signed)
Daily Session Note  Patient Details  Name: Carl Young MRN: [id number] Date of Birth: 1940-12-01 Referring Provider:   Flowsheet Row CARDIAC REHAB PHASE II ORIENTATION from 08/21/2020 in [place]  Referring Provider Dr. Cyndia Bent      Encounter Date: 09/04/2020  Check In:  Session Check In - 09/04/20 1100      Check-In   Supervising physician immediately available to respond to emergencies [facility] MD immediately available    Physician(s) Dr. Domenic Polite    Location [facility]    Staff Present Cathren Harsh, MS, Exercise Physiologist;Debra Wynetta Emery, RN, BSN    Virtual Visit No    Medication changes reported     Yes    Comments Add Norvas 5 mg    Fall or balance concerns reported    No    Tobacco Cessation No Change    Warm-up and Cool-down Performed as group-led instruction    Resistance Training Performed Yes    VAD Patient? No    PAD/SET Patient? No      Pain Assessment   Currently in Pain? No/denies    Pain Score 0-No pain    Multiple Pain Sites No           Capillary Blood Glucose: No results found for this or any previous visit (from the past 24 hour(s)).    Social History   Tobacco Use  Smoking Status Former Smoker  . Types: Cigarettes, Cigars  . Quit date: 07/08/1985  . Years since quitting: 35.1  Smokeless Tobacco Never Used    Goals Met:  Independence with exercise equipment Exercise tolerated well No report of cardiac concerns or symptoms Strength training completed today  Goals Unmet:  Not Applicable  Comments: check out 1200   Dr. Kathie Dike is Medical Director for [facility].

## 2020-09-06 ENCOUNTER — Encounter (HOSPITAL_COMMUNITY)
Admission: RE | Admit: 2020-09-06 | Discharge: 2020-09-06 | Disposition: A | Discharge status: Home / Self Care | Payer: Medicare Other | Source: Ambulatory Visit | Attending: Cardiology | Admitting: Cardiology

## 2020-09-06 ENCOUNTER — Other Ambulatory Visit: Payer: Self-pay

## 2020-09-06 DIAGNOSIS — Z951 Presence of aortocoronary bypass graft: Secondary | ICD-10-CM | POA: Diagnosis not present

## 2020-09-06 NOTE — Progress Notes (Signed)
Daily Session Note  Patient Details  Name: Carl Young MRN: [id number] Date of Birth: 1941-02-27 Referring Provider:   Flowsheet Row CARDIAC REHAB PHASE II ORIENTATION from 08/21/2020 in [place]  Referring Provider Dr. Cyndia Bent      Encounter Date: 09/06/2020  Check In:  Session Check In - 09/06/20 1100      Check-In   Supervising physician immediately available to respond to emergencies [facility] MD immediately available    Physician(s) Dr. Domenic Polite    Location [facility]    Staff Present Cathren Harsh, MS, Exercise Physiologist;Dalton Kris Mouton, MS, ACSM-CEP, Exercise Physiologist    Virtual Visit No    Medication changes reported     No    Fall or balance concerns reported    No    Tobacco Cessation No Change    Warm-up and Cool-down Performed as group-led instruction    Resistance Training Performed Yes    VAD Patient? No    PAD/SET Patient? No      Pain Assessment   Currently in Pain? No/denies    Pain Score 0-No pain    Multiple Pain Sites No           Capillary Blood Glucose: No results found for this or any previous visit (from the past 24 hour(s)).    Social History   Tobacco Use  Smoking Status Former Smoker  . Types: Cigarettes, Cigars  . Quit date: 07/08/1985  . Years since quitting: 35.1  Smokeless Tobacco Never Used    Goals Met:  Independence with exercise equipment Exercise tolerated well No report of cardiac concerns or symptoms Strength training completed today  Goals Unmet:  Not Applicable  Comments: check out 1200   Dr. Kathie Dike is Medical Director for [facility].

## 2020-09-06 NOTE — Progress Notes (Signed)
Cardiac Individual Treatment Plan  Patient Details  Name: Carl Young MRN: [id number] Date of Birth: [birth year] Referring Provider:   Flowsheet Row CARDIAC REHAB PHASE II ORIENTATION from 08/21/2020 in [place]  Referring Provider Dr. Cyndia Bent      Initial Encounter Date:  Flowsheet Row CARDIAC REHAB PHASE II ORIENTATION from 08/21/2020 in [place]  Date 08/21/20      Visit Diagnosis: S/P CABG x 5  Patient's Home Medications on Admission:  Current Outpatient Medications:  .  amLODipine (NORVASC) 5 MG tablet, Take 1 tablet (5 mg total) by mouth daily., Disp: 90 tablet, Rfl: 1 .  ascorbic acid (VITAMIN C) 1000 MG tablet, Take 1,000 mg by mouth daily. , Disp: , Rfl:  .  aspirin EC 325 MG EC tablet, Take 1 tablet (325 mg total) by mouth daily., Disp: , Rfl:  .  benazepril (LOTENSIN) 40 MG tablet, Take 40 mg by mouth daily. , Disp: , Rfl: 4 .  Cholecalciferol (VITAMIN D3) 50 MCG (2000 UT) capsule, Take 2,000 Units by mouth every evening. , Disp: , Rfl:  .  fenofibrate (TRICOR) 145 MG tablet, Take 145 mg by mouth every evening. , Disp: , Rfl:  .  fish oil-omega-3 fatty acids 1000 MG capsule, Take 1 g by mouth at bedtime. , Disp: , Rfl:  .  folic acid (FOLVITE) 809 MCG tablet, Take 400 mcg by mouth daily. , Disp: , Rfl:  .  Garlic 9833 MG CAPS, Take 1,000 mg by mouth at bedtime. , Disp: , Rfl:  .  Lysine 500 MG CAPS, Take 500 mg by mouth daily. , Disp: , Rfl:  .  metoprolol succinate (TOPROL-XL) 25 MG 24 hr tablet, Take 1 tablet (25 mg total) by mouth daily., Disp: 30 tablet, Rfl: 1 .  Multiple Vitamin (MULTIVITAMIN WITH MINERALS) TABS, Take 1 tablet by mouth daily., Disp: , Rfl:  .  Multiple Vitamins-Minerals (PRESERVISION AREDS 2+MULTI VIT PO), Take 1 capsule by mouth 2 (two) times daily., Disp: , Rfl:  .  rosuvastatin (CRESTOR) 40 MG tablet, Take 1 tablet (40 mg total) by mouth daily., Disp: 30 tablet, Rfl: 1 .  traMADol (ULTRAM) 50 MG  tablet, Take 1 tablet (50 mg total) by mouth every 6 (six) hours as needed., Disp: 40 tablet, Rfl: 0 .  zinc gluconate 50 MG tablet, Take 50 mg by mouth every evening. , Disp: , Rfl:   Past Medical History: Past Medical History:  Diagnosis Date  . Bell's palsy   . CKD (chronic kidney disease) stage 3, GFR 30-59 ml/min (HCC)   . Essential hypertension   . Helicobacter pylori (H. pylori) infection   . History of GI bleed   . Mixed hyperlipidemia   . Nephrolithiasis     Tobacco Use: Social History   Tobacco Use  Smoking Status Former Smoker  . Types: Cigarettes, Cigars  . Quit date: 07/08/1985  . Years since quitting: 35.1  Smokeless Tobacco Never Used    Labs: Recent Review Flowsheet Data    Labs for ITP Cardiac and Pulmonary Rehab Latest Ref Rng & Units 06/12/2020 06/12/2020 06/12/2020 06/12/2020 06/12/2020   Hemoglobin A1c 4.8 - 5.6 % - - - - -   PHART 7.350 - 7.450 - - 7.398 7.397 7.401   PCO2ART 32.0 - 48.0 mmHg - - 38.8 37.9 38.2   HCO3 20.0 - 28.0 mmol/L - - 23.9 23.1 23.4   TCO2 22 - 32 mmol/L 26 24 25 24  25  ACIDBASEDEF 0.0 - 2.0 mmol/L - - 1.0 1.0 1.0   O2SAT % - - 94.0 97.0 96.0      Capillary Blood Glucose: Lab Results  Component Value Date   GLUCAP 112 (H) 08/25/2020   GLUCAP 89 08/25/2020   GLUCAP 86 06/18/2020   GLUCAP 110 (H) 06/17/2020   GLUCAP 133 (H) 06/17/2020     Exercise Target Goals: Exercise Program Goal: Individual exercise prescription set using results from initial 6 min walk test and THRR while considering  patient's activity barriers and safety.   Exercise Prescription Goal: Starting with aerobic activity 30 plus minutes a day, 3 days per week for initial exercise prescription. Provide home exercise prescription and guidelines that participant acknowledges understanding prior to discharge.  Activity Barriers & Risk Stratification:   6 Minute Walk:  6 Minute Walk    Row Name 08/21/20 1427         6 Minute Walk   Phase Initial      Distance 1000 feet     Walk Time 6 minutes     # of Rest Breaks 0     MPH 1.9     METS 1.27     RPE 10     VO2 Peak 4.43     Symptoms No     Resting HR 81 bpm     Resting BP 170/84     Resting Oxygen Saturation  96 %     Exercise Oxygen Saturation  during 6 min walk 97 %     Max Ex. HR 88 bpm     Max Ex. BP 188/82     2 Minute Post BP 166/80            Oxygen Initial Assessment:   Oxygen Re-Evaluation:   Oxygen Discharge (Final Oxygen Re-Evaluation):   Initial Exercise Prescription:  Initial Exercise Prescription - 08/21/20 1400      Date of Initial Exercise RX and Referring Provider   Date 08/21/20    Referring Provider Dr. Cyndia Bent    Expected Discharge Date 11/15/20      NuStep   Level 1    SPM 80    Minutes 39      Prescription Details   Frequency (times per week) 3    Duration Progress to 30 minutes of continuous aerobic without signs/symptoms of physical distress      Intensity   THRR 40-80% of Max Heartrate 55-110    Ratings of Perceived Exertion 11-15      Progression   Progression Continue to progress workloads to maintain intensity without signs/symptoms of physical distress.      Resistance Training   Training Prescription Yes    Weight 3    Reps 10-15           Perform Capillary Blood Glucose checks as needed.  Exercise Prescription Changes:   Exercise Prescription Changes    Row Name 09/04/20 1200             Response to Exercise   Blood Pressure (Admit) 142/70       Blood Pressure (Exercise) 164/72       Blood Pressure (Exit) 150/60       Heart Rate (Admit) 84 bpm       Heart Rate (Exercise) 96 bpm       Heart Rate (Exit) 88 bpm       Rating of Perceived Exertion (Exercise) 12       Duration Continue with 30 min of  aerobic exercise without signs/symptoms of physical distress.       Intensity THRR unchanged               Progression   Progression Continue to progress workloads to maintain intensity without  signs/symptoms of physical distress.               Resistance Training   Training Prescription Yes       Weight 3 lbs       Reps 10-15       Time 10 Minutes               NuStep   Level 1       SPM 120       Minutes 39       METs 1.9              Exercise Comments:   Exercise Comments    Row Name 08/25/20 1200           Exercise Comments Patient completed first exercise session today. He tolerated exercise well and was excited to come back.              Exercise Goals and Review:   Exercise Goals    Row Name 08/21/20 1430 09/04/20 1224           Exercise Goals   Increase Physical Activity Yes Yes      Intervention Develop an individualized exercise prescription for aerobic and resistive training based on initial evaluation findings, risk stratification, comorbidities and participant's personal goals.;Provide advice, education, support and counseling about physical activity/exercise needs. Develop an individualized exercise prescription for aerobic and resistive training based on initial evaluation findings, risk stratification, comorbidities and participant's personal goals.;Provide advice, education, support and counseling about physical activity/exercise needs.      Expected Outcomes Long Term: Add in home exercise to make exercise part of routine and to increase amount of physical activity.;Short Term: Attend rehab on a regular basis to increase amount of physical activity.;Long Term: Exercising regularly at least 3-5 days a week. Long Term: Add in home exercise to make exercise part of routine and to increase amount of physical activity.;Short Term: Attend rehab on a regular basis to increase amount of physical activity.;Long Term: Exercising regularly at least 3-5 days a week.      Increase Strength and Stamina Yes Yes      Intervention Provide advice, education, support and counseling about physical activity/exercise needs.;Develop an individualized exercise  prescription for aerobic and resistive training based on initial evaluation findings, risk stratification, comorbidities and participant's personal goals. Provide advice, education, support and counseling about physical activity/exercise needs.;Develop an individualized exercise prescription for aerobic and resistive training based on initial evaluation findings, risk stratification, comorbidities and participant's personal goals.      Expected Outcomes Short Term: Increase workloads from initial exercise prescription for resistance, speed, and METs.;Short Term: Perform resistance training exercises routinely during rehab and add in resistance training at home;Long Term: Improve cardiorespiratory fitness, muscular endurance and strength as measured by increased METs and functional capacity (6MWT) Short Term: Increase workloads from initial exercise prescription for resistance, speed, and METs.;Short Term: Perform resistance training exercises routinely during rehab and add in resistance training at home;Long Term: Improve cardiorespiratory fitness, muscular endurance and strength as measured by increased METs and functional capacity (6MWT)      Able to understand and use rate of perceived exertion (RPE) scale Yes Yes      Intervention Provide education  and explanation on how to use RPE scale Provide education and explanation on how to use RPE scale      Expected Outcomes Short Term: Able to use RPE daily in rehab to express subjective intensity level;Long Term:  Able to use RPE to guide intensity level when exercising independently Short Term: Able to use RPE daily in rehab to express subjective intensity level;Long Term:  Able to use RPE to guide intensity level when exercising independently      Knowledge and understanding of Target Heart Rate Range (THRR) Yes Yes      Intervention Provide education and explanation of THRR including how the numbers were predicted and where they are located for reference  Provide education and explanation of THRR including how the numbers were predicted and where they are located for reference      Expected Outcomes Short Term: Able to state/look up THRR;Long Term: Able to use THRR to govern intensity when exercising independently;Short Term: Able to use daily as guideline for intensity in rehab Short Term: Able to state/look up THRR;Long Term: Able to use THRR to govern intensity when exercising independently;Short Term: Able to use daily as guideline for intensity in rehab      Able to check pulse independently Yes Yes      Intervention Provide education and demonstration on how to check pulse in carotid and radial arteries.;Review the importance of being able to check your own pulse for safety during independent exercise Provide education and demonstration on how to check pulse in carotid and radial arteries.;Review the importance of being able to check your own pulse for safety during independent exercise      Expected Outcomes Short Term: Able to explain why pulse checking is important during independent exercise;Long Term: Able to check pulse independently and accurately Short Term: Able to explain why pulse checking is important during independent exercise;Long Term: Able to check pulse independently and accurately      Understanding of Exercise Prescription Yes Yes      Intervention Provide education, explanation, and written materials on patient's individual exercise prescription Provide education, explanation, and written materials on patient's individual exercise prescription      Expected Outcomes Short Term: Able to explain program exercise prescription;Long Term: Able to explain home exercise prescription to exercise independently Short Term: Able to explain program exercise prescription;Long Term: Able to explain home exercise prescription to exercise independently             Exercise Goals Re-Evaluation :  Exercise Goals Re-Evaluation    Row Name  09/04/20 1225             Exercise Goal Re-Evaluation   Exercise Goals Review Increase Physical Activity;Increase Strength and Stamina;Able to understand and use rate of perceived exertion (RPE) scale;Knowledge and understanding of Target Heart Rate Range (THRR);Able to check pulse independently;Understanding of Exercise Prescription       Comments patient has completed 5 exercise sessions. He is tolerating exercise well and progressing well. He is enjoying the program and he is always enthusiastic about coming to rehab. He always is eager to get further in distance than he did the previous session. He also expressed interest in getting exercise equipment so he can exercise at home. He is currently exercising at 1.9 METs on the NuStep. Will continue to monitor and progress as able.       Expected Outcomes Throughout exercise at rehab and a home exercise program, patient will achieve their goals.  Discharge Exercise Prescription (Final Exercise Prescription Changes):  Exercise Prescription Changes - 09/04/20 1200      Response to Exercise   Blood Pressure (Admit) 142/70    Blood Pressure (Exercise) 164/72    Blood Pressure (Exit) 150/60    Heart Rate (Admit) 84 bpm    Heart Rate (Exercise) 96 bpm    Heart Rate (Exit) 88 bpm    Rating of Perceived Exertion (Exercise) 12    Duration Continue with 30 min of aerobic exercise without signs/symptoms of physical distress.    Intensity THRR unchanged      Progression   Progression Continue to progress workloads to maintain intensity without signs/symptoms of physical distress.      Resistance Training   Training Prescription Yes    Weight 3 lbs    Reps 10-15    Time 10 Minutes      NuStep   Level 1    SPM 120    Minutes 39    METs 1.9           Nutrition:  Target Goals: Understanding of nutrition guidelines, daily intake of sodium 1500mg , cholesterol 200mg , calories 30% from fat and 7% or less from saturated  fats, daily to have 5 or more servings of fruits and vegetables.  Biometrics:  Pre Biometrics - 09/04/20 1228      Pre Biometrics   Weight 90.7 kg    BMI (Calculated) 27.9            Nutrition Therapy Plan and Nutrition Goals:  Nutrition Therapy & Goals - 08/28/20 0843      Personal Nutrition Goals   Comments We will continue to provide heart healthy nutritional education through handouts.      Intervention Plan   Intervention Nutrition handout(s) given to patient.           Nutrition Assessments:  Nutrition Assessments - 08/21/20 1432      MEDFICTS Scores   Pre Score 114          MEDIFICTS Score Key:  ?70 Need to make dietary changes   40-70 Heart Healthy Diet  ? 40 Therapeutic Level Cholesterol Diet   Picture Your Plate Scores:  <56 Unhealthy dietary pattern with much room for improvement.  41-50 Dietary pattern unlikely to meet recommendations for good health and room for improvement.  51-60 More healthful dietary pattern, with some room for improvement.   >60 Healthy dietary pattern, although there may be some specific behaviors that could be improved.    Nutrition Goals Re-Evaluation:   Nutrition Goals Discharge (Final Nutrition Goals Re-Evaluation):   Psychosocial: Target Goals: Acknowledge presence or absence of significant depression and/or stress, maximize coping skills, provide positive support system. Participant is able to verbalize types and ability to use techniques and skills needed for reducing stress and depression.  Initial Review & Psychosocial Screening:  Initial Psych Review & Screening - 08/21/20 1435      Initial Review   Current issues with None Identified      Family Dynamics   Good Support System? Yes    Comments Patient says his wife is his support system. He was very active prior to his CABG procedure and is looking forward to getting back to his activities. He denies any depression, anxiety, or stress. He has a very  positive outlook regarding his future and is looking forward to participating in the program.      Barriers   Psychosocial barriers to participate in program There are no  identifiable barriers or psychosocial needs.      Screening Interventions   Interventions Encouraged to exercise;To provide support and resources with identified psychosocial needs    Expected Outcomes Short Term goal: Identification and review with participant of any Quality of Life or Depression concerns found by scoring the questionnaire.           Quality of Life Scores:  Quality of Life - 08/23/20 0756      Quality of Life   Select Quality of Life      Quality of Life Scores   Health/Function Pre 27.21 %    Socioeconomic Pre 29.14 %    Psych/Spiritual Pre 29.64 %    Family Pre 27 %    GLOBAL Pre 28.14 %          Scores of 19 and below usually indicate a poorer quality of life in these areas.  A difference of  2-3 points is a clinically meaningful difference.  A difference of 2-3 points in the total score of the Quality of Life Index has been associated with significant improvement in overall quality of life, self-image, physical symptoms, and general health in studies assessing change in quality of life.  PHQ-9: Recent Review Flowsheet Data    Depression screen [facility] 2/9 08/21/2020   Decreased Interest 0   Down, Depressed, Hopeless 0   PHQ - 2 Score 0   Altered sleeping 0   Tired, decreased energy 1   Change in appetite 0   Feeling bad or failure about yourself  0   Trouble concentrating 0   Moving slowly or fidgety/restless 0   Suicidal thoughts 0   PHQ-9 Score 1   Difficult doing work/chores Not difficult at all     Interpretation of Total Score  Total Score Depression Severity:  1-4 = Minimal depression, 5-9 = Mild depression, 10-14 = Moderate depression, 15-19 = Moderately severe depression, 20-27 = Severe depression   Psychosocial Evaluation and Intervention:  Psychosocial Evaluation -  08/21/20 1438      Psychosocial Evaluation & Interventions   Interventions Encouraged to exercise with the program and follow exercise prescription;Stress management education;Relaxation education    Comments Patient's initial QOL score was 28.14 overall and his PHQ-9 score was 1. He has no psychosocial issues identified at his orientation visit.    Expected Outcomes Patient will have no psychosocial issues identified at discharge.    Continue Psychosocial Services  No Follow up required           Psychosocial Re-Evaluation:  Psychosocial Re-Evaluation    [place] Name 08/28/20 [phone]             Psychosocial Re-Evaluation   Current issues with None Identified       Comments Patient is new the program completing 2 sessions. He seemed to enjoy his first sessions. Will continue to monitor his progress.       Expected Outcomes Patient will have no psychosocial issues identified at discharge.       Interventions Stress management education;Encouraged to attend Cardiac Rehabilitation for the exercise;Relaxation education       Continue Psychosocial Services  No Follow up required              Psychosocial Discharge (Final Psychosocial Re-Evaluation):  Psychosocial Re-Evaluation - 08/28/20 0844      Psychosocial Re-Evaluation   Current issues with None Identified    Comments Patient is new the program completing 2 sessions. He seemed to enjoy his first sessions. Will  continue to monitor his progress.    Expected Outcomes Patient will have no psychosocial issues identified at discharge.    Interventions Stress management education;Encouraged to attend Cardiac Rehabilitation for the exercise;Relaxation education    Continue Psychosocial Services  No Follow up required           Vocational Rehabilitation: Provide vocational rehab assistance to qualifying candidates.   Vocational Rehab Evaluation & Intervention:  Vocational Rehab - 08/21/20 1432      Initial Vocational Rehab  Evaluation & Intervention   Assessment shows need for Vocational Rehabilitation No      Vocational Rehab Re-Evaulation   Comments Patient is retired and is not interested in returning to work. He does not need vocational rehab.           Education: Education Goals: Education classes will be provided on a weekly basis, covering required topics. Participant will state understanding/return demonstration of topics presented.  Learning Barriers/Preferences:  Learning Barriers/Preferences - 08/21/20 1432      Learning Barriers/Preferences   Learning Barriers None    Learning Preferences Written Material;Audio           Education Topics: Hypertension, Hypertension Reduction -Define heart disease and high blood pressure. Discus how high blood pressure affects the body and ways to reduce high blood pressure.   Exercise and Your Heart -Discuss why it is important to exercise, the FITT principles of exercise, normal and abnormal responses to exercise, and how to exercise safely.   Angina -Discuss definition of angina, causes of angina, treatment of angina, and how to decrease risk of having angina.   Cardiac Medications -Review what the following cardiac medications are used for, how they affect the body, and side effects that may occur when taking the medications.  Medications include Aspirin, Beta blockers, calcium channel blockers, ACE Inhibitors, angiotensin receptor blockers, diuretics, digoxin, and antihyperlipidemics.   Congestive Heart Failure -Discuss the definition of CHF, how to live with CHF, the signs and symptoms of CHF, and how keep track of weight and sodium intake.   Heart Disease and Intimacy -Discus the effect sexual activity has on the heart, how changes occur during intimacy as we age, and safety during sexual activity.   Smoking Cessation / COPD -Discuss different methods to quit smoking, the health benefits of quitting smoking, and the definition of  COPD.   Nutrition I: Fats -Discuss the types of cholesterol, what cholesterol does to the heart, and how cholesterol levels can be controlled.   Nutrition II: Labels -Discuss the different components of food labels and how to read food label Flowsheet Row CARDIAC REHAB PHASE II EXERCISE from 08/30/2020 in [place]  Date 08/30/20  Educator DJ  Instruction Review Code 1- Verbalizes Understanding      Heart Parts/Heart Disease and PAD -Discuss the anatomy of the heart, the pathway of blood circulation through the heart, and these are affected by heart disease.   Stress I: Signs and Symptoms -Discuss the causes of stress, how stress may lead to anxiety and depression, and ways to limit stress.   Stress II: Relaxation -Discuss different types of relaxation techniques to limit stress.   Warning Signs of Stroke / TIA -Discuss definition of a stroke, what the signs and symptoms are of a stroke, and how to identify when someone is having stroke.   Knowledge Questionnaire Score:  Knowledge Questionnaire Score - 08/21/20 1432      Knowledge Questionnaire Score   Pre Score 22/24  Core Components/Risk Factors/Patient Goals at Admission:  Personal Goals and Risk Factors at Admission - 08/21/20 1433      Core Components/Risk Factors/Patient Goals on Admission    Weight Management Weight Maintenance    Lipids Yes    Intervention Provide education and support for participant on nutrition & aerobic/resistive exercise along with prescribed medications to achieve LDL 70mg , HDL >40mg .    Expected Outcomes Short Term: Participant states understanding of desired cholesterol values and is compliant with medications prescribed. Participant is following exercise prescription and nutrition guidelines.    Personal Goal Other Yes    Personal Goal Get stronger; have more energy. Get back to where he was before his surgery with his activities and energy levels.     Intervention Provide monitored exercise, blood pressure monitoring and education regarding lifestyle changes for risk modification.    Expected Outcomes Patient will completing the program meeting both program and personal goals.           Core Components/Risk Factors/Patient Goals Review:   Goals and Risk Factor Review    Row Name 08/28/20 0845             Core Components/Risk Factors/Patient Goals Review   Personal Goals Review Lipids;Other       Review Patient is new to the program completing 2 sessions. 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 He is participating in the program for risk modification. His personal goals for doing the program are to get stronger and get back to where he was prior to his CABG. Will continue to monitor his progress as he works towards meeting these goals.       Expected Outcomes Patient will complete the program meeting both program and personal goals.              Core Components/Risk Factors/Patient Goals at Discharge (Final Review):   Goals and Risk Factor Review - 08/28/20 0845      Core Components/Risk Factors/Patient Goals Review   Personal Goals Review Lipids;Other    Review Patient is new to the program completing 2 sessions. 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 He is participating in the program for risk modification. His personal goals for doing the program are to get stronger and get back to where he was prior to his CABG. Will continue to monitor his progress as he works towards meeting these goals.    Expected Outcomes Patient will complete the program meeting both program and personal goals.           ITP Comments:   Comments: ITP REVIEW Pt is making expected progress toward Cardiac Rehab goals after completing 6 sessions. Recommend continued exercise, life style modification, education, and increased stamina and strength.

## 2020-09-08 ENCOUNTER — Other Ambulatory Visit: Payer: Self-pay

## 2020-09-08 ENCOUNTER — Encounter (HOSPITAL_COMMUNITY)
Admission: RE | Admit: 2020-09-08 | Discharge: 2020-09-08 | Disposition: A | Discharge status: Home / Self Care | Payer: Medicare Other | Source: Ambulatory Visit | Attending: Cardiology | Admitting: Cardiology

## 2020-09-08 DIAGNOSIS — Z951 Presence of aortocoronary bypass graft: Secondary | ICD-10-CM | POA: Diagnosis not present

## 2020-09-08 NOTE — Progress Notes (Signed)
Daily Session Note  Patient Details  Name: Carl Young MRN: [id number] Date of Birth: 1940-10-09 Referring Provider:   Flowsheet Row CARDIAC REHAB PHASE II ORIENTATION from 08/21/2020 in [place]  Referring Provider Dr. Cyndia Bent      Encounter Date: 09/08/2020  Check In:  Session Check In - 09/08/20 1100      Check-In   Supervising physician immediately available to respond to emergencies [facility] MD immediately available    Physician(s) Dr. Johnsie Cancel    Location [facility]    Staff Present Aundra Dubin, RN, Bjorn Loser, MS, ACSM-CEP, Exercise Physiologist    Virtual Visit No    Medication changes reported     No    Fall or balance concerns reported    No    Tobacco Cessation No Change    Warm-up and Cool-down Performed as group-led instruction    Resistance Training Performed Yes    VAD Patient? No    PAD/SET Patient? No      Pain Assessment   Currently in Pain? No/denies    Pain Score 0-No pain    Multiple Pain Sites No           Capillary Blood Glucose: No results found for this or any previous visit (from the past 24 hour(s)).    Social History   Tobacco Use  Smoking Status Former Smoker  . Types: Cigarettes, Cigars  . Quit date: 07/08/1985  . Years since quitting: 35.1  Smokeless Tobacco Never Used    Goals Met:  Independence with exercise equipment Exercise tolerated well No report of cardiac concerns or symptoms Strength training completed today  Goals Unmet:  Not Applicable  Comments: checkout time is 1200   Dr. Kathie Dike is Medical Director for [facility] Pulmonary Rehab.

## 2020-09-11 ENCOUNTER — Other Ambulatory Visit: Payer: Self-pay

## 2020-09-11 ENCOUNTER — Encounter (HOSPITAL_COMMUNITY)
Admission: RE | Admit: 2020-09-11 | Discharge: 2020-09-11 | Disposition: A | Discharge status: Home / Self Care | Payer: Medicare Other | Source: Ambulatory Visit | Attending: Cardiology | Admitting: Cardiology

## 2020-09-11 DIAGNOSIS — Z951 Presence of aortocoronary bypass graft: Secondary | ICD-10-CM

## 2020-09-11 NOTE — Progress Notes (Signed)
Daily Session Note  Patient Details  Name: Carl Young MRN: [id number] Date of Birth: 1941-05-23 Referring Provider:   Flowsheet Row CARDIAC REHAB PHASE II ORIENTATION from 08/21/2020 in [place]  Referring Provider Dr. Cyndia Bent      Encounter Date: 09/11/2020  Check In:  Session Check In - 09/11/20 1100      Check-In   Supervising physician immediately available to respond to emergencies [facility] immediately available    Physician(s) Dr. Harrington Challenger    Location [facility]    Staff Present Aundra Dubin, RN, BSN;Maribel Hadley Audria Nine, MS, Exercise Physiologist    Virtual Visit No    Medication changes reported     No    Fall or balance concerns reported    No    Tobacco Cessation No Change    Warm-up and Cool-down Performed as group-led instruction    Resistance Training Performed Yes    VAD Patient? No    PAD/SET Patient? No      Pain Assessment   Currently in Pain? No/denies    Pain Score 0-No pain    Multiple Pain Sites No           Capillary Blood Glucose: No results found for this or any previous visit (from the past 24 hour(s)).    Social History   Tobacco Use  Smoking Status Former Smoker  . Types: Cigarettes, Cigars  . Quit date: 07/08/1985  . Years since quitting: 35.2  Smokeless Tobacco Never Used    Goals Met:  Independence with exercise equipment Exercise tolerated well No report of cardiac concerns or symptoms Strength training completed today  Goals Unmet:  Not Applicable  Comments: check out 1200   Dr. Kathie Dike is Medical Director for [facility] Pulmonary Rehab.

## 2020-09-11 NOTE — Progress Notes (Signed)
I have reviewed a Home Exercise Prescription with Carl Young . Carl Young is not currently exercising at home.  The patient was advised to walk 2-3 days a week for 30-40 minutes.  Saxon and I discussed how to progress their exercise prescription.  The patient stated that their goals were to get stronger and more efficient in everyday life.  The patient stated that they understand the exercise prescription.  We reviewed exercise guidelines, target heart rate during exercise, RPE Scale, weather conditions, NTG use, endpoints for exercise, warmup and cool down.  Patient is encouraged to come to me with any questions. I will continue to follow up with the patient to assist them with progression and safety.

## 2020-09-13 ENCOUNTER — Other Ambulatory Visit: Payer: Self-pay

## 2020-09-13 ENCOUNTER — Encounter (HOSPITAL_COMMUNITY): Admission: RE | Admit: 2020-09-13 | Payer: Medicare Other | Source: Ambulatory Visit

## 2020-09-15 ENCOUNTER — Other Ambulatory Visit: Payer: Self-pay

## 2020-09-15 ENCOUNTER — Encounter (HOSPITAL_COMMUNITY)
Admission: RE | Admit: 2020-09-15 | Discharge: 2020-09-15 | Disposition: A | Discharge status: Home / Self Care | Payer: Medicare Other | Source: Ambulatory Visit | Attending: Cardiology | Admitting: Cardiology

## 2020-09-15 DIAGNOSIS — Z951 Presence of aortocoronary bypass graft: Secondary | ICD-10-CM | POA: Diagnosis not present

## 2020-09-15 NOTE — Progress Notes (Signed)
Daily Session Note  Patient Details  Name: Carl Young MRN: [id number] Date of Birth: 03-02-1941 Referring Provider:   Flowsheet Row CARDIAC REHAB PHASE II ORIENTATION from 08/21/2020 in [place]  Referring Provider Dr. Cyndia Bent      Encounter Date: 09/15/2020  Check In:  Session Check In - 09/15/20 1100      Check-In   Supervising physician immediately available to respond to emergencies [facility] immediately available    Physician(s) Dr. Domenic Polite    Location [facility]    Staff Present Hoy Register, MS, ACSM-CEP, Exercise Physiologist;Madison Audria Nine, MS, Exercise Physiologist    Virtual Visit No    Medication changes reported     No    Fall or balance concerns reported    No    Tobacco Cessation No Change    Warm-up and Cool-down Performed as group-led instruction    Resistance Training Performed Yes    VAD Patient? No    PAD/SET Patient? No      Pain Assessment   Currently in Pain? No/denies    Pain Score 0-No pain    Multiple Pain Sites No           Capillary Blood Glucose: No results found for this or any previous visit (from the past 24 hour(s)).    Social History   Tobacco Use  Smoking Status Former Smoker  . Types: Cigarettes, Cigars  . Quit date: 07/08/1985  . Years since quitting: 35.2  Smokeless Tobacco Never Used    Goals Met:  Independence with exercise equipment Exercise tolerated well No report of cardiac concerns or symptoms Strength training completed today  Goals Unmet:  Not Applicable  Comments: checkout time is 1200   Dr. Kathie Dike is Medical Director for [facility] Pulmonary Rehab.

## 2020-09-18 ENCOUNTER — Encounter (HOSPITAL_COMMUNITY)
Admission: RE | Admit: 2020-09-18 | Discharge: 2020-09-18 | Disposition: A | Discharge status: Home / Self Care | Payer: Medicare Other | Source: Ambulatory Visit | Attending: Cardiology | Admitting: Cardiology

## 2020-09-18 ENCOUNTER — Other Ambulatory Visit: Payer: Self-pay

## 2020-09-18 VITALS — Wt 201.1 lb

## 2020-09-18 DIAGNOSIS — Z951 Presence of aortocoronary bypass graft: Secondary | ICD-10-CM

## 2020-09-18 NOTE — Progress Notes (Signed)
Daily Session Note  Patient Details  Name: Carl Young MRN: [id number] Date of Birth: 24-Apr-1941 Referring Provider:   Flowsheet Row CARDIAC REHAB PHASE II ORIENTATION from 08/21/2020 in [place]  Referring Provider Dr. Cyndia Bent      Encounter Date: 09/18/2020  Check In:  Session Check In - 09/18/20 1100      Check-In   Supervising physician immediately available to respond to emergencies [facility] immediately available    Physician(s) Dr. Harl Bowie    Location [facility]    Staff Present Hoy Register, MS, ACSM-CEP, Exercise Physiologist;Madison Audria Nine, MS, Exercise Physiologist    Virtual Visit No    Medication changes reported     No    Fall or balance concerns reported    No    Tobacco Cessation No Change    Warm-up and Cool-down Performed as group-led instruction    Resistance Training Performed Yes    VAD Patient? No    PAD/SET Patient? No      Pain Assessment   Currently in Pain? No/denies    Pain Score 0-No pain    Multiple Pain Sites No           Capillary Blood Glucose: No results found for this or any previous visit (from the past 24 hour(s)).    Social History   Tobacco Use  Smoking Status Former Smoker  . Types: Cigarettes, Cigars  . Quit date: 07/08/1985  . Years since quitting: 35.2  Smokeless Tobacco Never Used    Goals Met:  Independence with exercise equipment Exercise tolerated well No report of cardiac concerns or symptoms Strength training completed today  Goals Unmet:  Not Applicable  Comments: checkout time is 1200   Dr. Kathie Dike is Medical Director for [facility] Pulmonary Rehab.

## 2020-09-20 ENCOUNTER — Encounter (HOSPITAL_COMMUNITY)
Admission: RE | Admit: 2020-09-20 | Discharge: 2020-09-20 | Disposition: A | Discharge status: Home / Self Care | Payer: Medicare Other | Source: Ambulatory Visit | Attending: Cardiology | Admitting: Cardiology

## 2020-09-20 ENCOUNTER — Other Ambulatory Visit: Payer: Self-pay

## 2020-09-20 DIAGNOSIS — Z951 Presence of aortocoronary bypass graft: Secondary | ICD-10-CM

## 2020-09-20 NOTE — Progress Notes (Signed)
Daily Session Note  Patient Details  Name: LYRIK BURESH MRN: [id number] Date of Birth: 02-Apr-1941 Referring Provider:   Flowsheet Row CARDIAC REHAB PHASE II ORIENTATION from 08/21/2020 in [place]  Referring Provider Dr. Cyndia Bent      Encounter Date: 09/20/2020  Check In:  Session Check In - 09/20/20 1100      Check-In   Supervising physician immediately available to respond to emergencies [facility] immediately available    Physician(s) Dr. Harl Bowie    Location [facility]    Staff Present Cathren Harsh, MS, Exercise Physiologist;Debra Wynetta Emery, RN, BSN    Virtual Visit No    Medication changes reported     No    Fall or balance concerns reported    No    Tobacco Cessation No Change    Warm-up and Cool-down Performed as group-led instruction    Resistance Training Performed Yes    VAD Patient? No    PAD/SET Patient? No      Pain Assessment   Currently in Pain? No/denies    Pain Score 0-No pain    Multiple Pain Sites No           Capillary Blood Glucose: No results found for this or any previous visit (from the past 24 hour(s)).    Social History   Tobacco Use  Smoking Status Former Smoker  . Types: Cigarettes, Cigars  . Quit date: 07/08/1985  . Years since quitting: 35.2  Smokeless Tobacco Never Used    Goals Met:  Independence with exercise equipment Exercise tolerated well No report of cardiac concerns or symptoms Strength training completed today  Goals Unmet:  Not Applicable  Comments: check out 1200   Dr. Kathie Dike is Medical Director for [facility] Pulmonary Rehab.

## 2020-09-22 ENCOUNTER — Other Ambulatory Visit: Payer: Self-pay

## 2020-09-22 ENCOUNTER — Encounter (HOSPITAL_COMMUNITY)
Admission: RE | Admit: 2020-09-22 | Discharge: 2020-09-22 | Disposition: A | Discharge status: Home / Self Care | Payer: Medicare Other | Source: Ambulatory Visit | Attending: Cardiology | Admitting: Cardiology

## 2020-09-22 DIAGNOSIS — Z951 Presence of aortocoronary bypass graft: Secondary | ICD-10-CM

## 2020-09-22 NOTE — Progress Notes (Signed)
Daily Session Note  Patient Details  Name: Carl Young MRN: [id number] Date of Birth: 1941/02/25 Referring Provider:   Flowsheet Row CARDIAC REHAB PHASE II ORIENTATION from 08/21/2020 in [place]  Referring Provider Dr. Cyndia Bent      Encounter Date: 09/22/2020  Check In:  Session Check In - 09/22/20 1100      Check-In   Supervising physician immediately available to respond to emergencies [facility] MD immediately available    Physician(s) Dr. Harrington Challenger    Location [facility]    Staff Present Cathren Harsh, MS, Exercise Physiologist;Debra Wynetta Emery, RN, BSN    Virtual Visit No    Medication changes reported     No    Fall or balance concerns reported    No    Tobacco Cessation No Change    Warm-up and Cool-down Performed as group-led instruction    Resistance Training Performed Yes    VAD Patient? No    PAD/SET Patient? No      Pain Assessment   Currently in Pain? No/denies    Pain Score 0-No pain    Multiple Pain Sites No           Capillary Blood Glucose: No results found for this or any previous visit (from the past 24 hour(s)).    Social History   Tobacco Use  Smoking Status Former Smoker  . Types: Cigarettes, Cigars  . Quit date: 07/08/1985  . Years since quitting: 35.2  Smokeless Tobacco Never Used    Goals Met:  Independence with exercise equipment Exercise tolerated well No report of cardiac concerns or symptoms Strength training completed today  Goals Unmet:  Not Applicable  Comments: check out 1200   Dr. Kathie Dike is Medical Director for [facility] Pulmonary Rehab.

## 2020-09-25 ENCOUNTER — Other Ambulatory Visit: Payer: Self-pay

## 2020-09-25 ENCOUNTER — Encounter (HOSPITAL_COMMUNITY)
Admission: RE | Admit: 2020-09-25 | Discharge: 2020-09-25 | Disposition: A | Discharge status: Home / Self Care | Payer: Medicare Other | Source: Ambulatory Visit | Attending: Cardiology | Admitting: Cardiology

## 2020-09-25 DIAGNOSIS — Z951 Presence of aortocoronary bypass graft: Secondary | ICD-10-CM | POA: Diagnosis not present

## 2020-09-25 NOTE — Progress Notes (Signed)
Daily Session Note  Patient Details  Name: Carl Young MRN: [id number] Date of Birth: 07/30/1940 Referring Provider:   Flowsheet Row CARDIAC REHAB PHASE II ORIENTATION from 08/21/2020 in [place]  Referring Provider Dr. Cyndia Bent      Encounter Date: 09/25/2020  Check In:  Session Check In - 09/25/20 1100      Check-In   Supervising physician immediately available to respond to emergencies [facility] MD immediately available    Physician(s) Dr. Domenic Polite    Location [facility]    Staff Present Cathren Harsh, MS, Exercise Physiologist;Debra Wynetta Emery, RN, Bjorn Loser, MS, ACSM-CEP, Exercise Physiologist    Virtual Visit No    Medication changes reported     No    Fall or balance concerns reported    No    Tobacco Cessation No Change    Warm-up and Cool-down Performed as group-led instruction    Resistance Training Performed Yes    VAD Patient? No    PAD/SET Patient? No      Pain Assessment   Currently in Pain? No/denies    Pain Score 0-No pain    Multiple Pain Sites No           Capillary Blood Glucose: No results found for this or any previous visit (from the past 24 hour(s)).    Social History   Tobacco Use  Smoking Status Former Smoker  . Types: Cigarettes, Cigars  . Quit date: 07/08/1985  . Years since quitting: 35.2  Smokeless Tobacco Never Used    Goals Met:  Independence with exercise equipment Exercise tolerated well No report of cardiac concerns or symptoms Strength training completed today  Goals Unmet:  Not Applicable  Comments: checkout time is 1200   Dr. Kathie Dike is Medical Director for [facility] Pulmonary Rehab.

## 2020-09-27 ENCOUNTER — Encounter (HOSPITAL_COMMUNITY)
Admission: RE | Admit: 2020-09-27 | Discharge: 2020-09-27 | Disposition: A | Discharge status: Home / Self Care | Payer: Medicare Other | Source: Ambulatory Visit | Attending: Cardiology | Admitting: Cardiology

## 2020-09-27 ENCOUNTER — Other Ambulatory Visit: Payer: Self-pay

## 2020-09-27 DIAGNOSIS — Z951 Presence of aortocoronary bypass graft: Secondary | ICD-10-CM | POA: Diagnosis not present

## 2020-09-27 NOTE — Progress Notes (Signed)
Daily Session Note  Patient Details  Name: Carl Young MRN: [id number] Date of Birth: Sep 20, 1940 Referring Provider:   Flowsheet Row CARDIAC REHAB PHASE II ORIENTATION from 08/21/2020 in [place]  Referring Provider Dr. Cyndia Bent      Encounter Date: 09/27/2020  Check In:  Session Check In - 09/27/20 1100      Check-In   Supervising physician immediately available to respond to emergencies [facility] MD immediately available    Physician(s) Dr. Domenic Polite    Location [facility]    Staff Present Cathren Harsh, MS, Exercise Physiologist;Debra Wynetta Emery, RN, BSN    Virtual Visit No    Medication changes reported     No    Fall or balance concerns reported    No    Tobacco Cessation No Change    Warm-up and Cool-down Performed as group-led instruction    Resistance Training Performed Yes    VAD Patient? No    PAD/SET Patient? No      Pain Assessment   Currently in Pain? No/denies    Pain Score 0-No pain    Multiple Pain Sites No           Capillary Blood Glucose: No results found for this or any previous visit (from the past 24 hour(s)).    Social History   Tobacco Use  Smoking Status Former Smoker  . Types: Cigarettes, Cigars  . Quit date: 07/08/1985  . Years since quitting: 35.2  Smokeless Tobacco Never Used    Goals Met:  Independence with exercise equipment Exercise tolerated well No report of cardiac concerns or symptoms Strength training completed today  Goals Unmet:  Not Applicable  Comments: check out 1200   Dr. Kathie Dike is Medical Director for [facility] Pulmonary Rehab.

## 2020-09-28 DIAGNOSIS — Z79899 Other long term (current) drug therapy: Secondary | ICD-10-CM | POA: Diagnosis not present

## 2020-09-28 DIAGNOSIS — I4891 Unspecified atrial fibrillation: Secondary | ICD-10-CM | POA: Diagnosis not present

## 2020-09-28 DIAGNOSIS — I25119 Atherosclerotic heart disease of native coronary artery with unspecified angina pectoris: Secondary | ICD-10-CM | POA: Diagnosis not present

## 2020-09-28 DIAGNOSIS — E7800 Pure hypercholesterolemia, unspecified: Secondary | ICD-10-CM | POA: Diagnosis not present

## 2020-09-28 DIAGNOSIS — I10 Essential (primary) hypertension: Secondary | ICD-10-CM | POA: Diagnosis not present

## 2020-09-28 DIAGNOSIS — Z299 Encounter for prophylactic measures, unspecified: Secondary | ICD-10-CM | POA: Diagnosis not present

## 2020-09-29 ENCOUNTER — Other Ambulatory Visit: Payer: Self-pay

## 2020-09-29 ENCOUNTER — Encounter (HOSPITAL_COMMUNITY)
Admission: RE | Admit: 2020-09-29 | Discharge: 2020-09-29 | Disposition: A | Discharge status: Home / Self Care | Payer: Medicare Other | Source: Ambulatory Visit | Attending: Cardiology | Admitting: Cardiology

## 2020-09-29 DIAGNOSIS — Z951 Presence of aortocoronary bypass graft: Secondary | ICD-10-CM | POA: Diagnosis not present

## 2020-09-29 NOTE — Progress Notes (Signed)
Daily Session Note  Patient Details  Name: Carl Young MRN: [id number] Date of Birth: 06-01-1941 Referring Provider:   Flowsheet Row CARDIAC REHAB PHASE II ORIENTATION from 08/21/2020 in [place]  Referring Provider Dr. Cyndia Bent      Encounter Date: 09/29/2020  Check In:  Session Check In - 09/29/20 1100      Check-In   Supervising physician immediately available to respond to emergencies [facility] MD immediately available    Physician(s) Dr. Harl Bowie    Location [facility]    Staff Present Cathren Harsh, MS, Exercise Physiologist;Jessup Ogas Kris Mouton, MS, ACSM-CEP, Exercise Physiologist    Virtual Visit No    Medication changes reported     No    Fall or balance concerns reported    No    Tobacco Cessation No Change    Warm-up and Cool-down Performed as group-led instruction    Resistance Training Performed Yes    VAD Patient? No    PAD/SET Patient? No      Pain Assessment   Currently in Pain? No/denies    Pain Score 0-No pain    Multiple Pain Sites No           Capillary Blood Glucose: No results found for this or any previous visit (from the past 24 hour(s)).    Social History   Tobacco Use  Smoking Status Former Smoker  . Types: Cigarettes, Cigars  . Quit date: 07/08/1985  . Years since quitting: 35.2  Smokeless Tobacco Never Used    Goals Met:  Independence with exercise equipment Exercise tolerated well No report of cardiac concerns or symptoms Strength training completed today  Goals Unmet:  Not Applicable  Comments: checkout time is 1200   Dr. Kathie Dike is Medical Director for [facility] Pulmonary Rehab.

## 2020-10-02 ENCOUNTER — Encounter (HOSPITAL_COMMUNITY)
Admission: RE | Admit: 2020-10-02 | Discharge: 2020-10-02 | Disposition: A | Discharge status: Home / Self Care | Payer: Medicare Other | Source: Ambulatory Visit | Attending: Cardiology | Admitting: Cardiology

## 2020-10-02 ENCOUNTER — Other Ambulatory Visit: Payer: Self-pay

## 2020-10-02 VITALS — Wt 203.0 lb

## 2020-10-02 DIAGNOSIS — Z951 Presence of aortocoronary bypass graft: Secondary | ICD-10-CM | POA: Diagnosis not present

## 2020-10-02 NOTE — Progress Notes (Signed)
Daily Session Note  Patient Details  Name: Carl Young MRN: [id number] Date of Birth: August 03, 1940 Referring Provider:   Flowsheet Row CARDIAC REHAB PHASE II ORIENTATION from 08/21/2020 in [place]  Referring Provider Dr. Cyndia Bent      Encounter Date: 10/02/2020  Check In:  Session Check In - 10/02/20 1100      Check-In   Supervising physician immediately available to respond to emergencies [facility] MD immediately available    Physician(s) Dr. Harl Bowie    Location [facility]    Staff Present Cathren Harsh, MS, Exercise Physiologist;Dalton Kris Mouton, MS, ACSM-CEP, Exercise Physiologist    Virtual Visit No    Medication changes reported     No    Fall or balance concerns reported    No    Tobacco Cessation No Change    Warm-up and Cool-down Performed as group-led instruction    Resistance Training Performed Yes    VAD Patient? No    PAD/SET Patient? No      Pain Assessment   Currently in Pain? No/denies    Pain Score 0-No pain    Multiple Pain Sites No           Capillary Blood Glucose: No results found for this or any previous visit (from the past 24 hour(s)).    Social History   Tobacco Use  Smoking Status Former Smoker  . Types: Cigarettes, Cigars  . Quit date: 07/08/1985  . Years since quitting: 35.2  Smokeless Tobacco Never Used    Goals Met:  Independence with exercise equipment Exercise tolerated well No report of cardiac concerns or symptoms Strength training completed today  Goals Unmet:  Not Applicable  Comments: check out 1200   Dr. Kathie Dike is Medical Director for [facility] Pulmonary Rehab.

## 2020-10-03 ENCOUNTER — Encounter: Payer: Self-pay | Admitting: Cardiology

## 2020-10-03 NOTE — Progress Notes (Signed)
Cardiology Office Note  Date: 10/05/2020   ID: [name], DOB [birth year], MRN [id number]  PCP:  Glenda Chroman, MD  Cardiologist:  Rozann Lesches, MD Electrophysiologist:  None   Chief Complaint  Patient presents with  . Cardiac follow-up    History of Present Illness: Carl Young is an 80 y.o. male last seen in January by Mr. Leonides Sake NP.  He is here today with his wife for a follow-up visit.  States that he has been doing well overall.  Gradually increasing his activity.  He is also participating in cardiac rehabilitation.  Postsurgical pain has resolved.  He does not report any angina symptoms or palpitations.  He is status post CABG in December 2021 including LIMA to LAD, SVG to ramus intermedius, SVG to OM 2 and OM 3, and SVG to PDA.  We went over his medications today which are listed below.  He reports compliance and no obvious intolerances.  I reviewed his most recent lab work as noted below.  Past Medical History:  Diagnosis Date  . Bell's palsy   . 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 CKD (chronic kidney disease) stage 3, GFR 30-59 ml/min (HCC)   . Essential hypertension   . Helicobacter pylori (H. pylori) infection   . History of GI bleed   . Mixed hyperlipidemia   . Nephrolithiasis   . Postoperative atrial fibrillation [facility])     Past Surgical History:  Procedure Laterality Date  . APPENDECTOMY    . COLONOSCOPY  05/06/2012   Procedure: COLONOSCOPY;  Surgeon: Rogene Houston, MD;  Location: [facility];  Service: Endoscopy;  Laterality: N/A;  730  . COLONOSCOPY N/A 11/03/2019   Procedure: COLONOSCOPY;  Surgeon: Rogene Houston, MD;  Location: [facility];  Service: Endoscopy;  Laterality: N/A;  930-rescheduled 5/5 same time per office  . CORONARY ARTERY BYPASS GRAFT N/A 06/12/2020   Procedure: CORONARY ARTERY BYPASS GRAFTING (CABG), ON  PUMP, TIMES FIVE, USING LEFT INTERNAL MAMMARY ARTERY AND ENDOSCOPICALLY HARVESTED RIGHT GREATER SAPHENOUS VEIN;  Surgeon: Gaye Pollack, MD;  Location: [place];  Service: Open Heart Surgery;  Laterality: N/A;  . HAMMER TOE SURGERY Left 07/09/2017   Procedure: HAMMER TOE CORRECTION 2ND DIGIT LEFT FOOT;  Surgeon: Caprice Beaver, DPM;  Location: [facility];  Service: Podiatry;  Laterality: Left;  . LEFT HEART CATH AND CORONARY ANGIOGRAPHY N/A 06/09/2020   Procedure: LEFT HEART CATH AND CORONARY ANGIOGRAPHY;  Surgeon: Troy Sine, MD;  Location: [facility];  Service: Cardiovascular;  Laterality: N/A;  . METATARSAL HEAD EXCISION Left 08/25/2019   Procedure: METATARSAL HEAD RESECTION OF THE 2ND AND 3RD METATARSAL LEFT FOOT;  Surgeon: Caprice Beaver, DPM;  Location: [facility];  Service: Podiatry;  Laterality: Left;  . POLYPECTOMY  11/03/2019   Procedure: POLYPECTOMY;  Surgeon: Rogene Houston, MD;  [facility];  Service: Endoscopy;;  . TEE WITHOUT CARDIOVERSION N/A 06/12/2020   Procedure: TRANSESOPHAGEAL ECHOCARDIOGRAM (TEE);  Surgeon: Gaye Pollack, MD;  Location: [facility];  Service: Open Heart Surgery;  Laterality: N/A;  . WEIL OSTEOTOMY Left 07/09/2017   Procedure: WEIL OSTEOTOMY 2ND METATARSAL LEFT FOOT;  Surgeon: Caprice Beaver, DPM;  Location: [facility];  Service: Podiatry;  Laterality: Left;    Current Outpatient Medications  Medication Sig Dispense Refill  . amLODipine (  NORVASC) 5 MG tablet Take 1 tablet (5 mg total) by mouth daily. 90 tablet 1  . ascorbic acid (VITAMIN C) 1000 MG tablet Take 1,000 mg by mouth daily.     . benazepril (LOTENSIN) 40 MG tablet Take 40 mg by mouth daily.   4  . Cholecalciferol (VITAMIN D3) 50 MCG (2000 UT) capsule Take 2,000 Units by mouth every evening.     . fenofibrate (TRICOR) 145 MG tablet Take 145 mg by mouth every evening.     . fish oil-omega-3 fatty acids 1000 MG capsule Take 1 g by mouth at bedtime.     . folic acid (FOLVITE) 010 MCG  tablet Take 400 mcg by mouth daily.     . Garlic 9323 MG CAPS Take 1,000 mg by mouth at bedtime.     Marland Kitchen Lysine 500 MG CAPS Take 500 mg by mouth daily.     . metoprolol succinate (TOPROL-XL) 25 MG 24 hr tablet Take 1 tablet (25 mg total) by mouth daily. 30 tablet 1  . Multiple Vitamin (MULTIVITAMIN WITH MINERALS) TABS Take 1 tablet by mouth daily.    . Multiple Vitamins-Minerals (PRESERVISION AREDS 2+MULTI VIT PO) Take 1 capsule by mouth 2 (two) times daily.    . rosuvastatin (CRESTOR) 40 MG tablet Take 1 tablet (40 mg total) by mouth daily. 30 tablet 1  . zinc gluconate 50 MG tablet Take 50 mg by mouth every evening.     Marland Kitchen aspirin 325 MG EC tablet Take 1 tablet (325 mg total) by mouth daily.     No current facility-administered medications for this visit.   Allergies:  Ciprofloxacin in d5w   ROS: No syncope.  Physical Exam: VS:  BP 130/68   Pulse 78   Ht 5\' 11"  (1.803 m)   Wt 202 lb (91.6 kg)   SpO2 98%   BMI 28.17 kg/m , BMI Body mass index is 28.17 kg/m.  Wt Readings from Last 3 Encounters:  10/05/20 202 lb (91.6 kg)  10/02/20 203 lb 0.7 oz (92.1 kg)  09/18/20 201 lb 1 oz (91.2 kg)    General: Elderly male, appears comfortable at rest. HEENT: Conjunctiva and lids normal, wearing a mask. Neck: Supple, no elevated JVP or carotid bruits, no thyromegaly. Lungs: Clear to auscultation, nonlabored breathing at rest. Thorax: Well-healed sternal incision. Cardiac: Regular rate and rhythm, no S3, soft systolic murmur, no pericardial rub. Extremities: No pitting edema.  ECG:  An ECG dated 08/02/2020 was personally reviewed today and demonstrated:  Normal sinus rhythm.  Recent Labwork: 03/24/2020: ALT 33; AST 37 06/13/2020: Magnesium 2.3 06/18/2020: BUN 20; Creatinine, Ser 1.28; Hemoglobin 9.4; Platelets 218; Potassium 4.3; Sodium 141  September 2021: Cholesterol 223, triglycerides 334, HDL 28, LDL 134 April 2022: Cholesterol 164, triglycerides 216, HDL 36, LDL 91, BUN 18, creatinine  1.32, potassium 4.6, AST 31, ALT 21  Other Studies Reviewed Today:  Echocardiogram 06/10/2020: 1. Left ventricular ejection fraction, by estimation, is 55 to 60%. The  left ventricle has normal function. The left ventricle has no regional  wall motion abnormalities. There is mild concentric left ventricular  hypertrophy. Left ventricular diastolic  parameters are consistent with Grade I diastolic dysfunction (impaired  relaxation).  2. Right ventricular systolic function is normal. The right ventricular  size is normal. There is normal pulmonary artery systolic pressure. The  estimated right ventricular systolic pressure is 55.7 mmHg.  3. The mitral valve is normal in structure. Mild mitral valve  regurgitation. No evidence of mitral stenosis.  4. The aortic valve is normal in structure. There is severe calcifcation  of the aortic valve. There is severe thickening of the aortic valve.  Aortic valve regurgitation is trivial. Mild aortic valve stenosis. Aortic  valve mean gradient measures 11.0  mmHg.  5. The inferior vena cava is normal in size with greater than 50%  respiratory variability, suggesting right atrial pressure of 3 mmHg.   Assessment and Plan:  1.  Multivessel CAD status post CABG in December 2021 as noted above.  He is recuperating well.  Reduce aspirin to 81 mg daily in June. Continue Norvasc, Lotensin, Crestor, TriCor, and Toprol-XL.  Continue with cardiac rehabilitation.  I did remind him about lifting restrictions.  2.  Mixed hyperlipidemia, LDL coming down, most recently 91 on high-dose Crestor.  He is also on TriCor.  Continue with current medications for now.  3.  Essential hypertension, systolic 397 today.  Continue current doses of Lotensin, Toprol-XL and Norvasc.  4.  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  Medication Adjustments/Labs and Tests Ordered: Current medicines are reviewed at length with the patient today.  Concerns regarding medicines are outlined  above.   Tests Ordered: No orders of the defined types were placed in this encounter.   Medication Changes: Meds ordered this encounter  Medications  . aspirin 325 MG EC tablet    Sig: Take 1 tablet (325 mg total) by mouth daily.    10/05/2020 decrease to 81 mg in June 2022    Disposition:  Follow up 6 months in the [place] office.  Signed, Satira Sark, MD, [facility] 10/05/2020 11:58 AM    [facility] [place], [place], [place] [address] Phone: [phone]; Fax: [phone]

## 2020-10-04 ENCOUNTER — Other Ambulatory Visit: Payer: Self-pay

## 2020-10-04 ENCOUNTER — Encounter (HOSPITAL_COMMUNITY)
Admission: RE | Admit: 2020-10-04 | Discharge: 2020-10-04 | Disposition: A | Discharge status: Home / Self Care | Payer: Medicare Other | Source: Ambulatory Visit | Attending: Cardiology | Admitting: Cardiology

## 2020-10-04 DIAGNOSIS — Z951 Presence of aortocoronary bypass graft: Secondary | ICD-10-CM

## 2020-10-04 NOTE — Progress Notes (Signed)
Daily Session Note  Patient Details  Name: Carl Young MRN: [id number] Date of Birth: 03-10-1941 Referring Provider:   Flowsheet Row CARDIAC REHAB PHASE II ORIENTATION from 08/21/2020 in [facility]  Referring Provider Dr. Cyndia Bent      Encounter Date: 10/04/2020  Check In:  Session Check In - 10/04/20 1100      Check-In   Supervising physician immediately available to respond to emergencies [facility] MD immediately available    Physician(s) Dr. Harl Bowie    Location [facility]    Staff Present Cathren Harsh, MS, Exercise Physiologist;Dalton Kris Mouton, MS, ACSM-CEP, Exercise Physiologist    Virtual Visit No    Medication changes reported     No    Fall or balance concerns reported    No    Tobacco Cessation No Change    Warm-up and Cool-down Performed as group-led instruction    Resistance Training Performed Yes    VAD Patient? No    PAD/SET Patient? No      Pain Assessment   Currently in Pain? No/denies    Pain Score 0-No pain    Multiple Pain Sites No           Capillary Blood Glucose: No results found for this or any previous visit (from the past 24 hour(s)).    Social History   Tobacco Use  Smoking Status Former Smoker  . Types: Cigarettes, Cigars  . Quit date: 07/08/1985  . Years since quitting: 35.2  Smokeless Tobacco Never Used    Goals Met:  Independence with exercise equipment Exercise tolerated well No report of cardiac concerns or symptoms Strength training completed today  Goals Unmet:  Not Applicable  Comments: check out 1200   Dr. Kathie Dike is Medical Director for [facility].

## 2020-10-04 NOTE — Progress Notes (Signed)
Cardiac Individual Treatment Plan  Patient Details  Name: Carl Young MRN: [id number] Date of Birth: [birth year] Referring Provider:   Flowsheet Row CARDIAC REHAB PHASE II ORIENTATION from 08/21/2020 in [place]  Referring Provider Dr. Cyndia Bent      Initial Encounter Date:  Flowsheet Row CARDIAC REHAB PHASE II ORIENTATION from 08/21/2020 in [place]  Date 08/21/20      Visit Diagnosis: S/P CABG x 5  Patient's Home Medications on Admission:  Current Outpatient Medications:  .  amLODipine (NORVASC) 5 MG tablet, Take 1 tablet (5 mg total) by mouth daily., Disp: 90 tablet, Rfl: 1 .  ascorbic acid (VITAMIN C) 1000 MG tablet, Take 1,000 mg by mouth daily. , Disp: , Rfl:  .  aspirin EC 325 MG EC tablet, Take 1 tablet (325 mg total) by mouth daily., Disp: , Rfl:  .  benazepril (LOTENSIN) 40 MG tablet, Take 40 mg by mouth daily. , Disp: , Rfl: 4 .  Cholecalciferol (VITAMIN D3) 50 MCG (2000 UT) capsule, Take 2,000 Units by mouth every evening. , Disp: , Rfl:  .  fenofibrate (TRICOR) 145 MG tablet, Take 145 mg by mouth every evening. , Disp: , Rfl:  .  fish oil-omega-3 fatty acids 1000 MG capsule, Take 1 g by mouth at bedtime. , Disp: , Rfl:  .  folic acid (FOLVITE) 660 MCG tablet, Take 400 mcg by mouth daily. , Disp: , Rfl:  .  Garlic 6301 MG CAPS, Take 1,000 mg by mouth at bedtime. , Disp: , Rfl:  .  Lysine 500 MG CAPS, Take 500 mg by mouth daily. , Disp: , Rfl:  .  metoprolol succinate (TOPROL-XL) 25 MG 24 hr tablet, Take 1 tablet (25 mg total) by mouth daily., Disp: 30 tablet, Rfl: 1 .  Multiple Vitamin (MULTIVITAMIN WITH MINERALS) TABS, Take 1 tablet by mouth daily., Disp: , Rfl:  .  Multiple Vitamins-Minerals (PRESERVISION AREDS 2+MULTI VIT PO), Take 1 capsule by mouth 2 (two) times daily., Disp: , Rfl:  .  rosuvastatin (CRESTOR) 40 MG tablet, Take 1 tablet (40 mg total) by mouth daily., Disp: 30 tablet, Rfl: 1 .  traMADol (ULTRAM) 50 MG  tablet, Take 1 tablet (50 mg total) by mouth every 6 (six) hours as needed., Disp: 40 tablet, Rfl: 0 .  zinc gluconate 50 MG tablet, Take 50 mg by mouth every evening. , Disp: , Rfl:   Past Medical History: Past Medical History:  Diagnosis Date  . Bell's palsy   . 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 CKD (chronic kidney disease) stage 3, GFR 30-59 ml/min (HCC)   . Essential hypertension   . Helicobacter pylori (H. pylori) infection   . History of GI bleed   . Mixed hyperlipidemia   . Nephrolithiasis   . Postoperative atrial fibrillation (HCC)     Tobacco Use: Social History   Tobacco Use  Smoking Status Former Smoker  . Types: Cigarettes, Cigars  . Quit date: 07/08/1985  . Years since quitting: 35.2  Smokeless Tobacco Never Used    Labs: Recent Chemical engineer    Labs for ITP Cardiac and Pulmonary Rehab Latest Ref Rng & Units 06/12/2020 06/12/2020 06/12/2020 06/12/2020 06/12/2020   Hemoglobin A1c 4.8 - 5.6 % - - - - -   PHART 7.350 - 7.450 - - 7.398  7.397 7.401   PCO2ART 32.0 - 48.0 mmHg - - 38.8 37.9 38.2   HCO3 20.0 - 28.0 mmol/L - - 23.9 23.1 23.4   TCO2 22 - 32 mmol/L 26 24 25 24 25    ACIDBASEDEF 0.0 - 2.0 mmol/L - - 1.0 1.0 1.0   O2SAT % - - 94.0 97.0 96.0      Capillary Blood Glucose: Lab Results  Component Value Date   GLUCAP 112 (H) 08/25/2020   GLUCAP 89 08/25/2020   GLUCAP 86 06/18/2020   GLUCAP 110 (H) 06/17/2020   GLUCAP 133 (H) 06/17/2020     Exercise Target Goals: Exercise Program Goal: Individual exercise prescription set using results from initial 6 min walk test and THRR while considering  patient's activity barriers and safety.   Exercise Prescription Goal: Starting with aerobic activity 30 plus minutes a day, 3 days per week for initial exercise prescription. Provide home exercise prescription and guidelines that participant  acknowledges understanding prior to discharge.  Activity Barriers & Risk Stratification:   6 Minute Walk:  6 Minute Walk    Row Name 08/21/20 1427         6 Minute Walk   Phase Initial     Distance 1000 feet     Walk Time 6 minutes     # of Rest Breaks 0     MPH 1.9     METS 1.27     RPE 10     VO2 Peak 4.43     Symptoms No     Resting HR 81 bpm     Resting BP 170/84     Resting Oxygen Saturation  96 %     Exercise Oxygen Saturation  during 6 min walk 97 %     Max Ex. HR 88 bpm     Max Ex. BP 188/82     2 Minute Post BP 166/80            Oxygen Initial Assessment:   Oxygen Re-Evaluation:   Oxygen Discharge (Final Oxygen Re-Evaluation):   Initial Exercise Prescription:  Initial Exercise Prescription - 08/21/20 1400      Date of Initial Exercise RX and Referring Provider   Date 08/21/20    Referring Provider Dr. Cyndia Bent    Expected Discharge Date 11/15/20      NuStep   Level 1    SPM 80    Minutes 39      Prescription Details   Frequency (times per week) 3    Duration Progress to 30 minutes of continuous aerobic without signs/symptoms of physical distress      Intensity   THRR 40-80% of Max Heartrate 55-110    Ratings of Perceived Exertion 11-15      Progression   Progression Continue to progress workloads to maintain intensity without signs/symptoms of physical distress.      Resistance Training   Training Prescription Yes    Weight 3    Reps 10-15           Perform Capillary Blood Glucose checks as needed.  Exercise Prescription Changes:   Exercise Prescription Changes    Row Name 09/04/20 1200 09/11/20 1100 09/18/20 1200 10/02/20 1200       Response to Exercise   Blood Pressure (Admit) 142/70 -- 110/60 142/70    Blood Pressure (Exercise) 164/72 -- 164/72 154/72    Blood Pressure (Exit) 150/60 -- 130/64 132/64    Heart Rate (Admit) 84 bpm -- 95 bpm 83 bpm  Heart Rate (Exercise) 96 bpm -- 123 bpm 113 bpm    Heart Rate (Exit) 88  bpm -- 96 bpm 90 bpm    Rating of Perceived Exertion (Exercise) 12 -- 11 11    Duration Continue with 30 min of aerobic exercise without signs/symptoms of physical distress. -- Continue with 30 min of aerobic exercise without signs/symptoms of physical distress. Continue with 30 min of aerobic exercise without signs/symptoms of physical distress.    Intensity THRR unchanged -- THRR unchanged THRR unchanged         Progression   Progression Continue to progress workloads to maintain intensity without signs/symptoms of physical distress. -- Continue to progress workloads to maintain intensity without signs/symptoms of physical distress. Continue to progress workloads to maintain intensity without signs/symptoms of physical distress.         Resistance Training   Training Prescription Yes -- Yes Yes    Weight 3 lbs -- 3 lbs 3 lbs    Reps 10-15 -- 10-15 10-15    Time 10 Minutes -- 10 Minutes 10 Minutes         NuStep   Level 1 -- 3 3    SPM 120 -- 131 183    Minutes 39 -- 39 39    METs 1.9 -- 1.9 2         Home Exercise Plan   Plans to continue exercise at -- Home (comment) -- --    Frequency -- Add 3 additional days to program exercise sessions. -- --    Initial Home Exercises Provided -- 09/11/20 -- --           Exercise Comments:   Exercise Comments    Row Name 08/25/20 1200 09/11/20 1209         Exercise Comments Patient completed first exercise session today. He tolerated exercise well and was excited to come back. patient and I reviewed home exercise program. Patient is not currently exercising at home, but will start doing light resistance training, biking and walking at home.             Exercise Goals and Review:   Exercise Goals    Row Name 08/21/20 1430 09/04/20 1224 10/02/20 0808         Exercise Goals   Increase Physical Activity Yes Yes Yes     Intervention Develop an individualized exercise prescription for aerobic and resistive training based on  initial evaluation findings, risk stratification, comorbidities and participant's personal goals.;Provide advice, education, support and counseling about physical activity/exercise needs. Develop an individualized exercise prescription for aerobic and resistive training based on initial evaluation findings, risk stratification, comorbidities and participant's personal goals.;Provide advice, education, support and counseling about physical activity/exercise needs. Develop an individualized exercise prescription for aerobic and resistive training based on initial evaluation findings, risk stratification, comorbidities and participant's personal goals.;Provide advice, education, support and counseling about physical activity/exercise needs.     Expected Outcomes Long Term: Add in home exercise to make exercise part of routine and to increase amount of physical activity.;Short Term: Attend rehab on a regular basis to increase amount of physical activity.;Long Term: Exercising regularly at least 3-5 days a week. Long Term: Add in home exercise to make exercise part of routine and to increase amount of physical activity.;Short Term: Attend rehab on a regular basis to increase amount of physical activity.;Long Term: Exercising regularly at least 3-5 days a week. Long Term: Add in home exercise to make exercise part of routine and  to increase amount of physical activity.;Short Term: Attend rehab on a regular basis to increase amount of physical activity.;Long Term: Exercising regularly at least 3-5 days a week.     Increase Strength and Stamina Yes Yes Yes     Intervention Provide advice, education, support and counseling about physical activity/exercise needs.;Develop an individualized exercise prescription for aerobic and resistive training based on initial evaluation findings, risk stratification, comorbidities and participant's personal goals. Provide advice, education, support and counseling about physical  activity/exercise needs.;Develop an individualized exercise prescription for aerobic and resistive training based on initial evaluation findings, risk stratification, comorbidities and participant's personal goals. Provide advice, education, support and counseling about physical activity/exercise needs.;Develop an individualized exercise prescription for aerobic and resistive training based on initial evaluation findings, risk stratification, comorbidities and participant's personal goals.     Expected Outcomes Short Term: Increase workloads from initial exercise prescription for resistance, speed, and METs.;Short Term: Perform resistance training exercises routinely during rehab and add in resistance training at home;Long Term: Improve cardiorespiratory fitness, muscular endurance and strength as measured by increased METs and functional capacity (6MWT) Short Term: Increase workloads from initial exercise prescription for resistance, speed, and METs.;Short Term: Perform resistance training exercises routinely during rehab and add in resistance training at home;Long Term: Improve cardiorespiratory fitness, muscular endurance and strength as measured by increased METs and functional capacity (6MWT) Short Term: Increase workloads from initial exercise prescription for resistance, speed, and METs.;Short Term: Perform resistance training exercises routinely during rehab and add in resistance training at home;Long Term: Improve cardiorespiratory fitness, muscular endurance and strength as measured by increased METs and functional capacity (6MWT)     Able to understand and use rate of perceived exertion (RPE) scale Yes Yes Yes     Intervention Provide education and explanation on how to use RPE scale Provide education and explanation on how to use RPE scale Provide education and explanation on how to use RPE scale     Expected Outcomes Short Term: Able to use RPE daily in rehab to express subjective intensity  level;Long Term:  Able to use RPE to guide intensity level when exercising independently Short Term: Able to use RPE daily in rehab to express subjective intensity level;Long Term:  Able to use RPE to guide intensity level when exercising independently Short Term: Able to use RPE daily in rehab to express subjective intensity level;Long Term:  Able to use RPE to guide intensity level when exercising independently     Knowledge and understanding of Target Heart Rate Range (THRR) Yes Yes Yes     Intervention Provide education and explanation of THRR including how the numbers were predicted and where they are located for reference Provide education and explanation of THRR including how the numbers were predicted and where they are located for reference Provide education and explanation of THRR including how the numbers were predicted and where they are located for reference     Expected Outcomes Short Term: Able to state/look up THRR;Long Term: Able to use THRR to govern intensity when exercising independently;Short Term: Able to use daily as guideline for intensity in rehab Short Term: Able to state/look up THRR;Long Term: Able to use THRR to govern intensity when exercising independently;Short Term: Able to use daily as guideline for intensity in rehab Short Term: Able to state/look up THRR;Long Term: Able to use THRR to govern intensity when exercising independently;Short Term: Able to use daily as guideline for intensity in rehab     Able to check pulse independently  Yes Yes Yes     Intervention Provide education and demonstration on how to check pulse in carotid and radial arteries.;Review the importance of being able to check your own pulse for safety during independent exercise Provide education and demonstration on how to check pulse in carotid and radial arteries.;Review the importance of being able to check your own pulse for safety during independent exercise Provide education and demonstration on how  to check pulse in carotid and radial arteries.;Review the importance of being able to check your own pulse for safety during independent exercise     Expected Outcomes Short Term: Able to explain why pulse checking is important during independent exercise;Long Term: Able to check pulse independently and accurately Short Term: Able to explain why pulse checking is important during independent exercise;Long Term: Able to check pulse independently and accurately Short Term: Able to explain why pulse checking is important during independent exercise;Long Term: Able to check pulse independently and accurately     Understanding of Exercise Prescription Yes Yes Yes     Intervention Provide education, explanation, and written materials on patient's individual exercise prescription Provide education, explanation, and written materials on patient's individual exercise prescription Provide education, explanation, and written materials on patient's individual exercise prescription     Expected Outcomes Short Term: Able to explain program exercise prescription;Long Term: Able to explain home exercise prescription to exercise independently Short Term: Able to explain program exercise prescription;Long Term: Able to explain home exercise prescription to exercise independently Short Term: Able to explain program exercise prescription;Long Term: Able to explain home exercise prescription to exercise independently            Exercise Goals Re-Evaluation :  Exercise Goals Re-Evaluation    [place] Name 09/04/20 1225 10/02/20 1200           Exercise Goal Re-Evaluation   Exercise Goals Review Increase Physical Activity;Increase Strength and Stamina;Able to understand and use rate of perceived exertion (RPE) scale;Knowledge and understanding of Target Heart Rate Range (THRR);Able to check pulse independently;Understanding of Exercise Prescription Increase Physical Activity;Increase Strength and Stamina;Able to understand and  use rate of perceived exertion (RPE) scale;Knowledge and understanding of Target Heart Rate Range (THRR);Able to check pulse independently;Understanding of Exercise Prescription      Comments patient has completed 5 exercise sessions. He is tolerating exercise well and progressing well. He is enjoying the program and he is always enthusiastic about coming to rehab. He always is eager to get further in distance than he did the previous session. He also expressed interest in getting exercise equipment so he can exercise at home. He is currently exercising at 1.9 METs on the NuStep. Will continue to monitor and progress as able. Patient has completed 16 exercise sessions. He tolerates exercise well and is increasing intensities fairly quickly. He has been more interested in his heart rate while exercising lately. He asks questions about his heart rate and about exercise. He has increased to level 3 on the NuStep. He keeps track of his blood pressure readings at rehab and takes them home. He asks many questions about this and how it relates to his health and exercise. He is currently exercising at 2.0 METs on the NuStep. Will continue to monitor and progress as able.      Expected Outcomes Throughout exercise at rehab and a home exercise program, patient will achieve their goals. Throughout exercise at rehab and a home exercise program, patient will achieve their goals.  Discharge Exercise Prescription (Final Exercise Prescription Changes):  Exercise Prescription Changes - 10/02/20 1200      Response to Exercise   Blood Pressure (Admit) 142/70    Blood Pressure (Exercise) 154/72    Blood Pressure (Exit) 132/64    Heart Rate (Admit) 83 bpm    Heart Rate (Exercise) 113 bpm    Heart Rate (Exit) 90 bpm    Rating of Perceived Exertion (Exercise) 11    Duration Continue with 30 min of aerobic exercise without signs/symptoms of physical distress.    Intensity THRR unchanged      Progression    Progression Continue to progress workloads to maintain intensity without signs/symptoms of physical distress.      Resistance Training   Training Prescription Yes    Weight 3 lbs    Reps 10-15    Time 10 Minutes      NuStep   Level 3    SPM 183    Minutes 39    METs 2           Nutrition:  Target Goals: Understanding of nutrition guidelines, daily intake of sodium 1500mg , cholesterol 200mg , calories 30% from fat and 7% or less from saturated fats, daily to have 5 or more servings of fruits and vegetables.  Biometrics:  Pre Biometrics - 10/02/20 1200      Pre Biometrics   Weight 92.1 kg            Nutrition Therapy Plan and Nutrition Goals:  Nutrition Therapy & Goals - 09/25/20 0817      Personal Nutrition Goals   Comments We provide 2 educational sessions with handouts on heart healthy nutrition.      Intervention Plan   Intervention Nutrition handout(s) given to patient.           Nutrition Assessments:  Nutrition Assessments - 08/21/20 1432      MEDFICTS Scores   Pre Score 114          MEDIFICTS Score Key:  ?70 Need to make dietary changes   40-70 Heart Healthy Diet  ? 40 Therapeutic Level Cholesterol Diet   Picture Your Plate Scores:  <16 Unhealthy dietary pattern with much room for improvement.  41-50 Dietary pattern unlikely to meet recommendations for good health and room for improvement.  51-60 More healthful dietary pattern, with some room for improvement.   >60 Healthy dietary pattern, although there may be some specific behaviors that could be improved.    Nutrition Goals Re-Evaluation:   Nutrition Goals Discharge (Final Nutrition Goals Re-Evaluation):   Psychosocial: Target Goals: Acknowledge presence or absence of significant depression and/or stress, maximize coping skills, provide positive support system. Participant is able to verbalize types and ability to use techniques and skills needed for reducing stress and  depression.  Initial Review & Psychosocial Screening:  Initial Psych Review & Screening - 08/21/20 1435      Initial Review   Current issues with None Identified      Family Dynamics   Good Support System? Yes    Comments Patient says his wife is his support system. He was very active prior to his CABG procedure and is looking forward to getting back to his activities. He denies any depression, anxiety, or stress. He has a very positive outlook regarding his future and is looking forward to participating in the program.      Barriers   Psychosocial barriers to participate in program There are no identifiable barriers or psychosocial needs.  Screening Interventions   Interventions Encouraged to exercise;To provide support and resources with identified psychosocial needs    Expected Outcomes Short Term goal: Identification and review with participant of any Quality of Life or Depression concerns found by scoring the questionnaire.           Quality of Life Scores:  Quality of Life - 08/23/20 0756      Quality of Life   Select Quality of Life      Quality of Life Scores   Health/Function Pre 27.21 %    Socioeconomic Pre 29.14 %    Psych/Spiritual Pre 29.64 %    Family Pre 27 %    GLOBAL Pre 28.14 %          Scores of 19 and below usually indicate a poorer quality of life in these areas.  A difference of  2-3 points is a clinically meaningful difference.  A difference of 2-3 points in the total score of the Quality of Life Index has been associated with significant improvement in overall quality of life, self-image, physical symptoms, and general health in studies assessing change in quality of life.  PHQ-9: Recent Review Flowsheet Data    Depression screen [facility] 2/9 08/21/2020   Decreased Interest 0   Down, Depressed, Hopeless 0   PHQ - 2 Score 0   Altered sleeping 0   Tired, decreased energy 1   Change in appetite 0   Feeling bad or failure about yourself  0    Trouble concentrating 0   Moving slowly or fidgety/restless 0   Suicidal thoughts 0   PHQ-9 Score 1   Difficult doing work/chores Not difficult at all     Interpretation of Total Score  Total Score Depression Severity:  1-4 = Minimal depression, 5-9 = Mild depression, 10-14 = Moderate depression, 15-19 = Moderately severe depression, 20-27 = Severe depression   Psychosocial Evaluation and Intervention:  Psychosocial Evaluation - 08/21/20 1438      Psychosocial Evaluation & Interventions   Interventions Encouraged to exercise with the program and follow exercise prescription;Stress management education;Relaxation education    Comments Patient's initial QOL score was 28.14 overall and his PHQ-9 score was 1. He has no psychosocial issues identified at his orientation visit.    Expected Outcomes Patient will have no psychosocial issues identified at discharge.    Continue Psychosocial Services  No Follow up required           Psychosocial Re-Evaluation:  Psychosocial Re-Evaluation    [place] Name 08/28/20 0844 09/25/20 0750           Psychosocial Re-Evaluation   Current issues with None Identified --      Comments Patient is new the program completing 2 sessions. He seemed to enjoy his first sessions. Will continue to monitor his progress. Patient has completed 13 sessions doing well in the program. He continues to have no psychosocial barriers identified and enjoys coming to class. He is very interactive with staff and others in his class. Will continue to monitor.      Expected Outcomes Patient will have no psychosocial issues identified at discharge. Patient will have no psychosocial issues identified at discharge.      Interventions Stress management education;Encouraged to attend Cardiac Rehabilitation for the exercise;Relaxation education Stress management education;Encouraged to attend Cardiac Rehabilitation for the exercise;Relaxation education      Continue Psychosocial Services   No Follow up required No Follow up required  Psychosocial Discharge (Final Psychosocial Re-Evaluation):  Psychosocial Re-Evaluation - 09/25/20 0750      Psychosocial Re-Evaluation   Comments Patient has completed 13 sessions doing well in the program. He continues to have no psychosocial barriers identified and enjoys coming to class. He is very interactive with staff and others in his class. Will continue to monitor.    Expected Outcomes Patient will have no psychosocial issues identified at discharge.    Interventions Stress management education;Encouraged to attend Cardiac Rehabilitation for the exercise;Relaxation education    Continue Psychosocial Services  No Follow up required           Vocational Rehabilitation: Provide vocational rehab assistance to qualifying candidates.   Vocational Rehab Evaluation & Intervention:  Vocational Rehab - 08/21/20 1432      Initial Vocational Rehab Evaluation & Intervention   Assessment shows need for Vocational Rehabilitation No      Vocational Rehab Re-[place]   Comments Patient is retired and is not interested in returning to work. He does not need vocational rehab.           Education: Education Goals: Education classes will be provided on a weekly basis, covering required topics. Participant will state understanding/return demonstration of topics presented.  Learning Barriers/Preferences:  Learning Barriers/Preferences - 08/21/20 1432      Learning Barriers/Preferences   Learning Barriers None    Learning Preferences Written Material;Audio           Education Topics: Hypertension, Hypertension Reduction -Define heart disease and high blood pressure. Discus how high blood pressure affects the body and ways to reduce high blood pressure.   Exercise and Your Heart -Discuss why it is important to exercise, the FITT principles of exercise, normal and abnormal responses to exercise, and how to exercise  safely.   Angina -Discuss definition of angina, causes of angina, treatment of angina, and how to decrease risk of having angina.   Cardiac Medications -Review what the following cardiac medications are used for, how they affect the body, and side effects that may occur when taking the medications.  Medications include Aspirin, Beta blockers, calcium channel blockers, ACE Inhibitors, angiotensin receptor blockers, diuretics, digoxin, and antihyperlipidemics.   Congestive Heart Failure -Discuss the definition of CHF, how to live with CHF, the signs and symptoms of CHF, and how keep track of weight and sodium intake.   Heart Disease and Intimacy -Discus the effect sexual activity has on the heart, how changes occur during intimacy as we age, and safety during sexual activity.   Smoking Cessation / COPD -Discuss different methods to quit smoking, the health benefits of quitting smoking, and the definition of COPD.   Nutrition I: Fats -Discuss the types of cholesterol, what cholesterol does to the heart, and how cholesterol levels can be controlled.   Nutrition II: Labels -Discuss the different components of food labels and how to read food label [place] from 09/27/2020 in [place]  Date 08/30/20  Educator DJ  Instruction Review Code 1- Verbalizes Understanding      Heart Parts/Heart Disease and PAD -Discuss the anatomy of the heart, the pathway of blood circulation through the heart, and these are affected by heart disease.   Stress I: Signs and Symptoms -Discuss the causes of stress, how stress may lead to anxiety and depression, and ways to limit stress.   Stress II: Relaxation -Discuss different types of relaxation techniques to limit stress. Flowsheet Row CARDIAC REHAB PHASE II EXERCISE  from 09/27/2020 in [place]  Date 09/20/20  Educator mk  Instruction Review Code 1- Verbalizes  Understanding      Warning Signs of Stroke / TIA -Discuss definition of a stroke, what the signs and symptoms are of a stroke, and how to identify when someone is having stroke. Flowsheet Row CARDIAC REHAB PHASE II EXERCISE from 09/27/2020 in [place]  Date 09/27/20  Educator mk  Instruction Review Code 2- Demonstrated Understanding      Knowledge Questionnaire Score:  Knowledge Questionnaire Score - 08/21/20 1432      Knowledge Questionnaire Score   Pre Score 22/24           Core Components/Risk Factors/Patient Goals at Admission:  Personal Goals and Risk Factors at Admission - 08/21/20 1433      Core Components/Risk Factors/Patient Goals on Admission    Weight Management Weight Maintenance    Lipids Yes    Intervention Provide education and support for participant on nutrition & aerobic/resistive exercise along with prescribed medications to achieve LDL 70mg , HDL >40mg .    Expected Outcomes Short Term: Participant states understanding of desired cholesterol values and is compliant with medications prescribed. Participant is following exercise prescription and nutrition guidelines.    Personal Goal Other Yes    Personal Goal Get stronger; have more energy. Get back to where he was before his surgery with his activities and energy levels.    Intervention Provide monitored exercise, blood pressure monitoring and education regarding lifestyle changes for risk modification.    Expected Outcomes Patient will completing the program meeting both program and personal goals.           Core Components/Risk Factors/Patient Goals Review:   Goals and Risk Factor Review    Row Name 08/28/20 0845 09/25/20 9767           Core Components/Risk Factors/Patient Goals Review   Personal Goals Review Lipids;Other Lipids;Other      Review Patient is new to the program completing 2 sessions. 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 He  is participating in the program for risk modification. His personal goals for doing the program are to get stronger and get back to where he was prior to his CABG. Will continue to monitor his progress as he works towards meeting these goals. Patient has completed 13 sessions gaining 1 lb since last 30 day review. He is doing well in the program with consistent attendance and progression. His blood pressure has improved since Dr. Domenic Polite added amlodipine 5 mg. He is staying better controlled during exercise as well. He says he is feeling stronger. His personal goals for the program are to get stronger and to get back to where he was prior to his CABG. He is looking forward to planting his garden soon. Will continue to monitor for progress as he works toward meeting these goals.      Expected Outcomes Patient will complete the program meeting both program and personal goals. Patient will complete the program meeting both program and personal goals.             Core Components/Risk Factors/Patient Goals at Discharge (Final Review):   Goals and Risk Factor Review - 09/25/20 0812      Core Components/Risk Factors/Patient Goals Review   Personal Goals Review Lipids;Other    Review Patient has completed 13 sessions gaining 1 lb since last 30 day review. He is doing well in the  program with consistent attendance and progression. His blood pressure has improved since Dr. Domenic Polite added amlodipine 5 mg. He is staying better controlled during exercise as well. He says he is feeling stronger. His personal goals for the program are to get stronger and to get back to where he was prior to his CABG. He is looking forward to planting his garden soon. Will continue to monitor for progress as he works toward meeting these goals.    Expected Outcomes Patient will complete the program meeting both program and personal goals.           ITP Comments:   Comments: ITP REVIEW Pt is making expected progress toward  Cardiac Rehab goals after completing 17 sessions. Recommend continued exercise, life style modification, education, and increased stamina and strength.

## 2020-10-05 ENCOUNTER — Other Ambulatory Visit: Payer: Self-pay

## 2020-10-05 ENCOUNTER — Ambulatory Visit: Payer: Medicare Other | Admitting: Cardiology

## 2020-10-05 ENCOUNTER — Encounter: Payer: Self-pay | Admitting: Cardiology

## 2020-10-05 VITALS — BP 130/68 | HR 78 | Ht 71.0 in | Wt 202.0 lb

## 2020-10-05 DIAGNOSIS — I25119 Atherosclerotic heart disease of native coronary artery with unspecified angina pectoris: Secondary | ICD-10-CM

## 2020-10-05 DIAGNOSIS — I10 Essential (primary) hypertension: Secondary | ICD-10-CM | POA: Diagnosis not present

## 2020-10-05 DIAGNOSIS — N1832 Chronic kidney disease, stage 3b: Secondary | ICD-10-CM

## 2020-10-05 DIAGNOSIS — E782 Mixed hyperlipidemia: Secondary | ICD-10-CM | POA: Diagnosis not present

## 2020-10-05 MED ORDER — ASPIRIN 325 MG PO TBEC: 325.0000 mg | DELAYED_RELEASE_TABLET | Freq: Every day | ORAL | Status: AC

## 2020-10-05 NOTE — Patient Instructions (Addendum)
Medication Instructions:   Your physician has recommended you make the following change in your medication:   In June 2022, decrease aspirin to 81 mg by mouth daily  Continue other medications the same  Labwork:  none  Testing/Procedures:  none  Follow-Up:  Your physician recommends that you schedule a follow-up appointment in: 6 months.  Any Other Special Instructions Will Be Listed Below (If Applicable).  If you need a refill on your cardiac medications before your next appointment, please call your pharmacy.

## 2020-10-06 ENCOUNTER — Encounter (HOSPITAL_COMMUNITY)
Admission: RE | Admit: 2020-10-06 | Discharge: 2020-10-06 | Disposition: A | Discharge status: Home / Self Care | Payer: Medicare Other | Source: Ambulatory Visit | Attending: Cardiology | Admitting: Cardiology

## 2020-10-06 DIAGNOSIS — Z951 Presence of aortocoronary bypass graft: Secondary | ICD-10-CM | POA: Diagnosis not present

## 2020-10-06 NOTE — Progress Notes (Signed)
Daily Session Note  Patient Details  Name: Carl Young MRN: [id number] Date of Birth: [birth year] Referring Provider:   Flowsheet Row CARDIAC REHAB PHASE II ORIENTATION from 08/21/2020 in [place]  Referring Provider Dr. Cyndia Bent      Encounter Date: 10/06/2020  Check In:  Session Check In - 10/06/20 1100      Check-In   Supervising physician immediately available to respond to emergencies [facility] MD immediately available    Physician(s) Dr. Domenic Polite    Location [facility]    Staff Present Hoy Register, MS, ACSM-CEP, Exercise Physiologist;Debra Wynetta Emery, RN, BSN    Virtual Visit No    Medication changes reported     No    Fall or balance concerns reported    No    Tobacco Cessation No Change    Warm-up and Cool-down Performed as group-led instruction    Resistance Training Performed Yes    VAD Patient? No    PAD/SET Patient? No      Pain Assessment   Currently in Pain? No/denies    Pain Score 0-No pain    Multiple Pain Sites No           Capillary Blood Glucose: No results found for this or any previous visit (from the past 24 hour(s)).    Social History   Tobacco Use  Smoking Status Former Smoker  . Types: Cigarettes, Cigars  . Quit date: 07/08/1985  . Years since quitting: 35.2  Smokeless Tobacco Never Used    Goals Met:  Independence with exercise equipment Exercise tolerated well No report of cardiac concerns or symptoms Strength training completed today   Goals Unmet:  NA  Comments: checkout time is 1200   Dr. Kathie Dike is Medical Director for [facility] Pulmonary Rehab.

## 2020-10-09 ENCOUNTER — Encounter (HOSPITAL_COMMUNITY)
Admission: RE | Admit: 2020-10-09 | Discharge: 2020-10-09 | Disposition: A | Discharge status: Home / Self Care | Payer: Medicare Other | Source: Ambulatory Visit | Attending: Cardiology | Admitting: Cardiology

## 2020-10-09 ENCOUNTER — Other Ambulatory Visit: Payer: Self-pay

## 2020-10-09 DIAGNOSIS — Z951 Presence of aortocoronary bypass graft: Secondary | ICD-10-CM

## 2020-10-09 NOTE — Progress Notes (Signed)
Daily Session Note  Patient Details  Name: Carl Young MRN: [id number] Date of Birth: 05/06/1941 Referring Provider:   Flowsheet Row CARDIAC REHAB PHASE II ORIENTATION from 08/21/2020 in [place]  Referring Provider Dr. Cyndia Bent      Encounter Date: 10/09/2020  Check In:  Session Check In - 10/09/20 1100      Check-In   Supervising physician immediately available to respond to emergencies [facility] MD immediately available    Physician(s) Dr. Melene Muller    Location [facility]    Staff Present Cathren Harsh, MS, Exercise Physiologist;Dalton Kris Mouton, MS, ACSM-CEP, Exercise Physiologist    Virtual Visit No    Medication changes reported     No    Fall or balance concerns reported    No    Tobacco Cessation No Change    Warm-up and Cool-down Performed as group-led instruction    Resistance Training Performed Yes    VAD Patient? No    PAD/SET Patient? No      Pain Assessment   Currently in Pain? No/denies    Pain Score 0-No pain    Multiple Pain Sites No           Capillary Blood Glucose: No results found for this or any previous visit (from the past 24 hour(s)).    Social History   Tobacco Use  Smoking Status Former Smoker  . Types: Cigarettes, Cigars  . Quit date: 07/08/1985  . Years since quitting: 35.2  Smokeless Tobacco Never Used    Goals Met:  Independence with exercise equipment Exercise tolerated well No report of cardiac concerns or symptoms Strength training completed today  Goals Unmet:  Not Applicable  Comments: check out 1200   Dr. Kathie Dike is Medical Director for [facility] Pulmonary Rehab.

## 2020-10-11 ENCOUNTER — Other Ambulatory Visit: Payer: Self-pay

## 2020-10-11 ENCOUNTER — Encounter (HOSPITAL_COMMUNITY)
Admission: RE | Admit: 2020-10-11 | Discharge: 2020-10-11 | Disposition: A | Discharge status: Home / Self Care | Payer: Medicare Other | Source: Ambulatory Visit | Attending: Cardiology | Admitting: Cardiology

## 2020-10-11 DIAGNOSIS — Z951 Presence of aortocoronary bypass graft: Secondary | ICD-10-CM | POA: Diagnosis not present

## 2020-10-11 NOTE — Progress Notes (Signed)
Daily Session Note  Patient Details  Name: Carl Young MRN: [id number] Date of Birth: 24-Sep-1940 Referring Provider:   Flowsheet Row CARDIAC REHAB PHASE II ORIENTATION from 08/21/2020 in [place]  Referring Provider Dr. Cyndia Bent      Encounter Date: 10/11/2020  Check In:  Session Check In - 10/11/20 1100      Check-In   Supervising physician immediately available to respond to emergencies [facility] MD immediately available    Physician(s) Dr. Melene Muller    Location [facility]    Staff Present Cathren Harsh, MS, Exercise Physiologist;Debra Wynetta Emery, RN, BSN    Virtual Visit No    Medication changes reported     No    Fall or balance concerns reported    No    Tobacco Cessation No Change    Warm-up and Cool-down Performed as group-led instruction    Resistance Training Performed Yes    VAD Patient? No    PAD/SET Patient? No      Pain Assessment   Currently in Pain? No/denies    Pain Score 0-No pain    Multiple Pain Sites No           Capillary Blood Glucose: No results found for this or any previous visit (from the past 24 hour(s)).    Social History   Tobacco Use  Smoking Status Former Smoker  . Types: Cigarettes, Cigars  . Quit date: 07/08/1985  . Years since quitting: 35.2  Smokeless Tobacco Never Used    Goals Met:  Independence with exercise equipment Exercise tolerated well No report of cardiac concerns or symptoms Strength training completed today  Goals Unmet:  Not Applicable  Comments: check out 1200   Dr. Kathie Dike is Medical Director for [facility] Pulmonary Rehab.

## 2020-10-13 ENCOUNTER — Encounter (HOSPITAL_COMMUNITY)
Admission: RE | Admit: 2020-10-13 | Discharge: 2020-10-13 | Disposition: A | Discharge status: Home / Self Care | Payer: Medicare Other | Source: Ambulatory Visit | Attending: Cardiology | Admitting: Cardiology

## 2020-10-13 ENCOUNTER — Other Ambulatory Visit: Payer: Self-pay

## 2020-10-13 DIAGNOSIS — Z951 Presence of aortocoronary bypass graft: Secondary | ICD-10-CM

## 2020-10-13 NOTE — Progress Notes (Signed)
Daily Session Note  Patient Details  Name: Carl Young MRN: [id number] Date of Birth: 26-Sep-1940 Referring Provider:   Flowsheet Row CARDIAC REHAB PHASE II ORIENTATION from 08/21/2020 in [place]  Referring Provider Dr. Cyndia Bent      Encounter Date: 10/13/2020  Check In:  Session Check In - 10/13/20 1100      Check-In   Supervising physician immediately available to respond to emergencies [facility] MD immediately available    Physician(s) Dr. Harrington Challenger    Location [facility]    Staff Present Cathren Harsh, MS, Exercise Physiologist;Dalton Kris Mouton, MS, ACSM-CEP, Exercise Physiologist    Virtual Visit No    Medication changes reported     No    Fall or balance concerns reported    No    Tobacco Cessation No Change    Warm-up and Cool-down Performed as group-led instruction    Resistance Training Performed Yes    VAD Patient? No    PAD/SET Patient? No      Pain Assessment   Currently in Pain? No/denies    Pain Score 0-No pain    Multiple Pain Sites No           Capillary Blood Glucose: No results found for this or any previous visit (from the past 24 hour(s)).    Social History   Tobacco Use  Smoking Status Former Smoker  . Types: Cigarettes, Cigars  . Quit date: 07/08/1985  . Years since quitting: 35.2  Smokeless Tobacco Never Used    Goals Met:  Independence with exercise equipment Exercise tolerated well No report of cardiac concerns or symptoms Strength training completed today  Goals Unmet:  Not Applicable  Comments: check out 1200   Dr. Kathie Dike is Medical Director for [facility] Pulmonary Rehab.

## 2020-10-16 ENCOUNTER — Encounter (HOSPITAL_COMMUNITY)
Admission: RE | Admit: 2020-10-16 | Discharge: 2020-10-16 | Disposition: A | Discharge status: Home / Self Care | Payer: Medicare Other | Source: Ambulatory Visit | Attending: Cardiology | Admitting: Cardiology

## 2020-10-16 ENCOUNTER — Other Ambulatory Visit: Payer: Self-pay

## 2020-10-16 VITALS — Wt 203.7 lb

## 2020-10-16 DIAGNOSIS — Z951 Presence of aortocoronary bypass graft: Secondary | ICD-10-CM

## 2020-10-16 NOTE — Progress Notes (Signed)
Daily Session Note  Patient Details  Name: Carl Young MRN: [id number] Date of Birth: 01/25/1941 Referring Provider:   Flowsheet Row CARDIAC REHAB PHASE II ORIENTATION from 08/21/2020 in [place]  Referring Provider Dr. Cyndia Bent      Encounter Date: 10/16/2020  Check In:  Session Check In - 10/16/20 1100      Check-In   Supervising physician immediately available to respond to emergencies [facility] MD immediately available    Physician(s) Dr. Harl Bowie    Location [facility]    Staff Present Cathren Harsh, MS, Exercise Physiologist;Dalton Kris Mouton, MS, ACSM-CEP, Exercise Physiologist    Virtual Visit No    Medication changes reported     No    Fall or balance concerns reported    No    Tobacco Cessation No Change    Warm-up and Cool-down Performed as group-led instruction    Resistance Training Performed Yes    VAD Patient? No    PAD/SET Patient? No      Pain Assessment   Currently in Pain? No/denies    Pain Score 0-No pain    Multiple Pain Sites No           Capillary Blood Glucose: No results found for this or any previous visit (from the past 24 hour(s)).    Social History   Tobacco Use  Smoking Status Former Smoker  . Types: Cigarettes, Cigars  . Quit date: 07/08/1985  . Years since quitting: 35.2  Smokeless Tobacco Never Used    Goals Met:  Independence with exercise equipment Exercise tolerated well No report of cardiac concerns or symptoms Strength training completed today  Goals Unmet:  Not Applicable  Comments: check out 1200   Dr. Kathie Dike is Medical Director for [facility] Pulmonary Rehab.

## 2020-10-18 ENCOUNTER — Other Ambulatory Visit: Payer: Self-pay

## 2020-10-18 ENCOUNTER — Encounter (HOSPITAL_COMMUNITY)
Admission: RE | Admit: 2020-10-18 | Discharge: 2020-10-18 | Disposition: A | Discharge status: Home / Self Care | Payer: Medicare Other | Source: Ambulatory Visit | Attending: Cardiology | Admitting: Cardiology

## 2020-10-18 DIAGNOSIS — Z951 Presence of aortocoronary bypass graft: Secondary | ICD-10-CM

## 2020-10-18 NOTE — Progress Notes (Signed)
Daily Session Note  Patient Details  Name: Carl Young MRN: [id number] Date of Birth: 08/31/1940 Referring Provider:   Flowsheet Row CARDIAC REHAB PHASE II ORIENTATION from 08/21/2020 in [place]  Referring Provider Dr. Cyndia Bent      Encounter Date: 10/18/2020  Check In:  Session Check In - 10/18/20 1100      Check-In   Supervising physician immediately available to respond to emergencies [facility] MD immediately available    Physician(s) Dr. Harl Bowie    Location [facility]    Staff Present Cathren Harsh, MS, Exercise Physiologist;Dalton Kris Mouton, MS, ACSM-CEP, Exercise Physiologist    Virtual Visit No    Medication changes reported     No    Fall or balance concerns reported    No    Tobacco Cessation No Change    Warm-up and Cool-down Performed as group-led instruction    Resistance Training Performed Yes    VAD Patient? No    PAD/SET Patient? No      Pain Assessment   Currently in Pain? No/denies    Pain Score 0-No pain    Multiple Pain Sites No           Capillary Blood Glucose: No results found for this or any previous visit (from the past 24 hour(s)).    Social History   Tobacco Use  Smoking Status Former Smoker  . Types: Cigarettes, Cigars  . Quit date: 07/08/1985  . Years since quitting: 35.3  Smokeless Tobacco Never Used    Goals Met:  Independence with exercise equipment Exercise tolerated well No report of cardiac concerns or symptoms Strength training completed today  Goals Unmet:  Not Applicable  Comments: check out 1200   Dr. Kathie Dike is Medical Director for [facility] Pulmonary Rehab.

## 2020-10-20 ENCOUNTER — Other Ambulatory Visit: Payer: Self-pay

## 2020-10-20 ENCOUNTER — Encounter (HOSPITAL_COMMUNITY)
Admission: RE | Admit: 2020-10-20 | Discharge: 2020-10-20 | Disposition: A | Discharge status: Home / Self Care | Payer: Medicare Other | Source: Ambulatory Visit | Attending: Cardiology | Admitting: Cardiology

## 2020-10-20 DIAGNOSIS — Z951 Presence of aortocoronary bypass graft: Secondary | ICD-10-CM

## 2020-10-20 NOTE — Progress Notes (Signed)
Daily Session Note  Patient Details  Name: Carl Young MRN: [id number] Date of Birth: December 08, 1940 Referring Provider:   Flowsheet Row CARDIAC REHAB PHASE II ORIENTATION from 08/21/2020 in [place]  Referring Provider Dr. Cyndia Bent      Encounter Date: 10/20/2020  Check In:  Session Check In - 10/20/20 1100      Check-In   Supervising physician immediately available to respond to emergencies [facility] MD immediately available    Physician(s) Dr. Domenic Polite    Location [facility]    Staff Present Cathren Harsh, MS, Exercise Physiologist;Debra Wynetta Emery, RN, BSN    Virtual Visit No    Medication changes reported     No    Fall or balance concerns reported    No    Tobacco Cessation No Change    Warm-up and Cool-down Performed as group-led instruction    Resistance Training Performed Yes    VAD Patient? No    PAD/SET Patient? No      Pain Assessment   Currently in Pain? No/denies    Pain Score 0-No pain    Multiple Pain Sites No           Capillary Blood Glucose: No results found for this or any previous visit (from the past 24 hour(s)).    Social History   Tobacco Use  Smoking Status Former Smoker  . Types: Cigarettes, Cigars  . Quit date: 07/08/1985  . Years since quitting: 35.3  Smokeless Tobacco Never Used    Goals Met:  Independence with exercise equipment Exercise tolerated well No report of cardiac concerns or symptoms Strength training completed today  Goals Unmet:  Not Applicable  Comments: check out 1200   Dr. Kathie Dike is Medical Director for [facility] Pulmonary Rehab.

## 2020-10-23 ENCOUNTER — Other Ambulatory Visit: Payer: Self-pay

## 2020-10-23 ENCOUNTER — Encounter (HOSPITAL_COMMUNITY)
Admission: RE | Admit: 2020-10-23 | Discharge: 2020-10-23 | Disposition: A | Discharge status: Home / Self Care | Payer: Medicare Other | Source: Ambulatory Visit | Attending: Cardiology | Admitting: Cardiology

## 2020-10-23 DIAGNOSIS — Z951 Presence of aortocoronary bypass graft: Secondary | ICD-10-CM

## 2020-10-23 NOTE — Progress Notes (Signed)
Daily Session Note  Patient Details  Name: Carl Young MRN: [id number] Date of Birth: [birth year] Referring Provider:   Flowsheet Row CARDIAC REHAB PHASE II ORIENTATION from 08/21/2020 in [place]  Referring Provider Dr. Cyndia Bent      Encounter Date: 10/23/2020  Check In:  Session Check In - 10/23/20 1100      Check-In   Supervising physician immediately available to respond to emergencies [facility] MD immediately available    Physician(s) Dr. Domenic Polite    Location [facility]    Staff Present Cathren Harsh, MS, Exercise Physiologist;Debra Wynetta Emery, RN, BSN    Virtual Visit No    Medication changes reported     No    Fall or balance concerns reported    No    Tobacco Cessation No Change    Warm-up and Cool-down Performed as group-led instruction    Resistance Training Performed Yes    VAD Patient? No    PAD/SET Patient? No      Pain Assessment   Currently in Pain? No/denies    Pain Score 0-No pain    Multiple Pain Sites No           Capillary Blood Glucose: No results found for this or any previous visit (from the past 24 hour(s)).    Social History   Tobacco Use  Smoking Status Former Smoker  . Types: Cigarettes, Cigars  . Quit date: 07/08/1985  . Years since quitting: 35.3  Smokeless Tobacco Never Used    Goals Met:  Independence with exercise equipment Exercise tolerated well No report of cardiac concerns or symptoms Strength training completed today  Goals Unmet:  Not Applicable  Comments: check out 1200   Dr. Kathie Dike is Medical Director for [facility] Pulmonary Rehab.

## 2020-10-25 ENCOUNTER — Other Ambulatory Visit: Payer: Self-pay

## 2020-10-25 ENCOUNTER — Encounter (HOSPITAL_COMMUNITY)
Admission: RE | Admit: 2020-10-25 | Discharge: 2020-10-25 | Disposition: A | Discharge status: Home / Self Care | Payer: Medicare Other | Source: Ambulatory Visit | Attending: Cardiology | Admitting: Cardiology

## 2020-10-25 DIAGNOSIS — Z951 Presence of aortocoronary bypass graft: Secondary | ICD-10-CM | POA: Diagnosis not present

## 2020-10-25 NOTE — Progress Notes (Signed)
Daily Session Note  Patient Details  Name: Carl Young MRN: [id number] Date of Birth: 28-Jun-1941 Referring Provider:   Flowsheet Row CARDIAC REHAB PHASE II ORIENTATION from 08/21/2020 in [place]  Referring Provider Dr. Cyndia Bent      Encounter Date: 10/25/2020  Check In:  Session Check In - 10/25/20 1100      Check-In   Supervising physician immediately available to respond to emergencies [facility] MD immediately available    Physician(s) Dr. Domenic Polite    Location [facility]    Staff Present Cathren Harsh, MS, Exercise Physiologist;Debra Wynetta Emery, RN, BSN    Virtual Visit No    Medication changes reported     No    Fall or balance concerns reported    No    Tobacco Cessation No Change    Warm-up and Cool-down Performed as group-led instruction    Resistance Training Performed Yes    VAD Patient? No    PAD/SET Patient? No      Pain Assessment   Currently in Pain? No/denies    Pain Score 0-No pain    Multiple Pain Sites No           Capillary Blood Glucose: No results found for this or any previous visit (from the past 24 hour(s)).    Social History   Tobacco Use  Smoking Status Former Smoker  . Types: Cigarettes, Cigars  . Quit date: 07/08/1985  . Years since quitting: 35.3  Smokeless Tobacco Never Used    Goals Met:  Independence with exercise equipment Exercise tolerated well No report of cardiac concerns or symptoms Strength training completed today  Goals Unmet:  Not Applicable  Comments: checkout 1200   Dr. Kathie Dike is Medical Director for [facility] Pulmonary Re[facility].

## 2020-10-27 ENCOUNTER — Encounter (HOSPITAL_COMMUNITY)
Admission: RE | Admit: 2020-10-27 | Discharge: 2020-10-27 | Disposition: A | Discharge status: Home / Self Care | Payer: Medicare Other | Source: Ambulatory Visit | Attending: Cardiology | Admitting: Cardiology

## 2020-10-27 ENCOUNTER — Other Ambulatory Visit: Payer: Self-pay

## 2020-10-27 DIAGNOSIS — Z951 Presence of aortocoronary bypass graft: Secondary | ICD-10-CM | POA: Diagnosis not present

## 2020-10-27 NOTE — Progress Notes (Signed)
Daily Session Note  Patient Details  Name: Carl Young MRN: [id number] Date of Birth: 1940/12/20 Referring Provider:   Flowsheet Row CARDIAC REHAB PHASE II ORIENTATION from 08/21/2020 in [place]  Referring Provider Dr. Cyndia Bent      Encounter Date: 10/27/2020  Check In:  Session Check In - 10/27/20 1100      Check-In   Supervising physician immediately available to respond to emergencies [facility] MD immediately available    Physician(s) Dr. Domenic Polite    Location [facility]    Staff Present Cathren Harsh, MS, Exercise Physiologist;Tollie [place] Kris Mouton, MS, ACSM-CEP, Exercise Physiologist    Virtual Visit No    Medication changes reported     No    Fall or balance concerns reported    No    Tobacco Cessation No Change    Warm-up and Cool-down Performed as group-led instruction    VAD Patient? No      Pain Assessment   Currently in Pain? No/denies    Pain Score 0-No pain    Multiple Pain Sites No           Capillary Blood Glucose: No results found for this or any previous visit (from the past 24 hour(s)).    Social History   Tobacco Use  Smoking Status Former Smoker  . Types: Cigarettes, Cigars  . Quit date: 07/08/1985  . Years since quitting: 35.3  Smokeless Tobacco Never Used    Goals Met:  Independence with exercise equipment Exercise tolerated well No report of cardiac concerns or symptoms Strength training completed today  Goals Unmet:  Not Applicable  Comments: checkout time is 1200   Dr. Kathie Dike is Medical Director for [facility] Pulmonary Rehab.

## 2020-10-30 ENCOUNTER — Other Ambulatory Visit: Payer: Self-pay

## 2020-10-30 ENCOUNTER — Encounter (HOSPITAL_COMMUNITY)
Admission: RE | Admit: 2020-10-30 | Discharge: 2020-10-30 | Disposition: A | Discharge status: Home / Self Care | Payer: Medicare Other | Source: Ambulatory Visit | Attending: Cardiology | Admitting: Cardiology

## 2020-10-30 VITALS — Wt 203.0 lb

## 2020-10-30 DIAGNOSIS — Z951 Presence of aortocoronary bypass graft: Secondary | ICD-10-CM | POA: Insufficient documentation

## 2020-10-30 NOTE — Progress Notes (Signed)
Daily Session Note  Patient Details  Name: Carl Young MRN: [id number] Date of Birth: Nov 29, 1940 Referring Provider:   Flowsheet Row CARDIAC REHAB PHASE II ORIENTATION from 08/21/2020 in [place]  Referring Provider Dr. Cyndia Bent      Encounter Date: 10/30/2020  Check In:  Session Check In - 10/30/20 1100      Check-In   Supervising physician immediately available to respond to emergencies [facility] MD immediately available    Physician(s) Dr. Harl Bowie    Location [facility]    Staff Present Cathren Harsh, MS, Exercise Physiologist;Dalton Kris Mouton, MS, ACSM-CEP, Exercise Physiologist    Virtual Visit No    Medication changes reported     No    Fall or balance concerns reported    No    Tobacco Cessation No Change    Warm-up and Cool-down Performed as group-led instruction    Resistance Training Performed Yes    VAD Patient? No    PAD/SET Patient? No      Pain Assessment   Currently in Pain? No/denies    Pain Score 0-No pain    Multiple Pain Sites No           Capillary Blood Glucose: No results found for this or any previous visit (from the past 24 hour(s)).    Social History   Tobacco Use  Smoking Status Former Smoker  . Types: Cigarettes, Cigars  . Quit date: 07/08/1985  . Years since quitting: 35.3  Smokeless Tobacco Never Used    Goals Met:  Independence with exercise equipment Exercise tolerated well No report of cardiac concerns or symptoms Strength training completed today  Goals Unmet:  Not Applicable  Comments: check out 1200   Dr. Kathie Dike is Medical Director for [facility].

## 2020-11-01 ENCOUNTER — Other Ambulatory Visit: Payer: Self-pay

## 2020-11-01 ENCOUNTER — Encounter (HOSPITAL_COMMUNITY)
Admission: RE | Admit: 2020-11-01 | Discharge: 2020-11-01 | Disposition: A | Discharge status: Home / Self Care | Payer: Medicare Other | Source: Ambulatory Visit | Attending: Cardiology | Admitting: Cardiology

## 2020-11-01 DIAGNOSIS — Z951 Presence of aortocoronary bypass graft: Secondary | ICD-10-CM

## 2020-11-01 NOTE — Progress Notes (Signed)
Cardiac Individual Treatment Plan  Patient Details  Name: Carl Young MRN: [id number] Date of Birth: 1941-03-15 Referring Provider:   Flowsheet Row CARDIAC REHAB PHASE II ORIENTATION from 08/21/2020 in [facility]  Referring Provider Dr. Cyndia Bent      Initial Encounter Date:  Flowsheet Row CARDIAC REHAB PHASE II ORIENTATION from 08/21/2020 in [facility]  Date 08/21/20      Visit Diagnosis: S/P CABG x 5  Patient's Home Medications on Admission:  Current Outpatient Medications:  .  amLODipine (NORVASC) 5 MG tablet, Take 1 tablet (5 mg total) by mouth daily., Disp: 90 tablet, Rfl: 1 .  ascorbic acid (VITAMIN C) 1000 MG tablet, Take 1,000 mg by mouth daily. , Disp: , Rfl:  .  aspirin 325 MG EC tablet, Take 1 tablet (325 mg total) by mouth daily., Disp: , Rfl:  .  benazepril (LOTENSIN) 40 MG tablet, Take 40 mg by mouth daily. , Disp: , Rfl: 4 .  Cholecalciferol (VITAMIN D3) 50 MCG (2000 UT) capsule, Take 2,000 Units by mouth every evening. , Disp: , Rfl:  .  fenofibrate (TRICOR) 145 MG tablet, Take 145 mg by mouth every evening. , Disp: , Rfl:  .  fish oil-omega-3 fatty acids 1000 MG capsule, Take 1 g by mouth at bedtime. , Disp: , Rfl:  .  folic acid (FOLVITE) 338 MCG tablet, Take 400 mcg by mouth daily. , Disp: , Rfl:  .  Garlic 2505 MG CAPS, Take 1,000 mg by mouth at bedtime. , Disp: , Rfl:  .  Lysine 500 MG CAPS, Take 500 mg by mouth daily. , Disp: , Rfl:  .  metoprolol succinate (TOPROL-XL) 25 MG 24 hr tablet, Take 1 tablet (25 mg total) by mouth daily., Disp: 30 tablet, Rfl: 1 .  Multiple Vitamin (MULTIVITAMIN WITH MINERALS) TABS, Take 1 tablet by mouth daily., Disp: , Rfl:  .  Multiple Vitamins-Minerals (PRESERVISION AREDS 2+MULTI VIT PO), Take 1 capsule by mouth 2 (two) times daily., Disp: , Rfl:  .  rosuvastatin (CRESTOR) 40 MG tablet, Take 1 tablet (40 mg total) by mouth daily., Disp: 30 tablet, Rfl: 1 .  zinc gluconate 50 MG  tablet, Take 50 mg by mouth every evening. , Disp: , Rfl:   Past Medical History: Past Medical History:  Diagnosis Date  . Bell's palsy   . 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 CKD (chronic kidney disease) stage 3, GFR 30-59 ml/min (HCC)   . Essential hypertension   . Helicobacter pylori (H. pylori) infection   . History of GI bleed   . Mixed hyperlipidemia   . Nephrolithiasis   . Postoperative atrial fibrillation (HCC)     Tobacco Use: Social History   Tobacco Use  Smoking Status Former Smoker  . Types: Cigarettes, Cigars  . Quit date: 07/08/1985  . Years since quitting: 35.3  Smokeless Tobacco Never Used    Labs: Recent Chemical engineer    Labs for ITP Cardiac and Pulmonary Rehab Latest Ref Rng & Units 06/12/2020 06/12/2020 06/12/2020 06/12/2020 06/12/2020   Hemoglobin A1c 4.8 - 5.6 % - - - - -   PHART 7.350 - 7.450 - - 7.398 7.397 7.401   PCO2ART 32.0 - 48.0 mmHg - - 38.8 37.9 38.2   HCO3 20.0 - 28.0 mmol/L - - 23.9 23.1 23.4  TCO2 22 - 32 mmol/L 26 24 25 24 25    ACIDBASEDEF 0.0 - 2.0 mmol/L - - 1.0 1.0 1.0   O2SAT % - - 94.0 97.0 96.0      Capillary Blood Glucose: Lab Results  Component Value Date   GLUCAP 112 (H) 08/25/2020   GLUCAP 89 08/25/2020   GLUCAP 86 06/18/2020   GLUCAP 110 (H) 06/17/2020   GLUCAP 133 (H) 06/17/2020     Exercise Target Goals: Exercise Program Goal: Individual exercise prescription set using results from initial 6 min walk test and THRR while considering  patient's activity barriers and safety.   Exercise Prescription Goal: Starting with aerobic activity 30 plus minutes a day, 3 days per week for initial exercise prescription. Provide home exercise prescription and guidelines that participant acknowledges understanding prior to discharge.  Activity Barriers & Risk Stratification:   6 Minute Walk:  6 Minute  Walk    Row Name 08/21/20 1427         6 Minute Walk   Phase Initial     Distance 1000 feet     Walk Time 6 minutes     # of Rest Breaks 0     MPH 1.9     METS 1.27     RPE 10     VO2 Peak 4.43     Symptoms No     Resting HR 81 bpm     Resting BP 170/84     Resting Oxygen Saturation  96 %     Exercise Oxygen Saturation  during 6 min walk 97 %     Max Ex. HR 88 bpm     Max Ex. BP 188/82     2 Minute Post BP 166/80            Oxygen Initial Assessment:   Oxygen Re-Evaluation:   Oxygen Discharge (Final Oxygen Re-Evaluation):   Initial Exercise Prescription:  Initial Exercise Prescription - 08/21/20 1400      Date of Initial Exercise RX and Referring Provider   Date 08/21/20    Referring Provider Dr. Cyndia Bent    Expected Discharge Date 11/15/20      NuStep   Level 1    SPM 80    Minutes 39      Prescription Details   Frequency (times per week) 3    Duration Progress to 30 minutes of continuous aerobic without signs/symptoms of physical distress      Intensity   THRR 40-80% of Max Heartrate 55-110    Ratings of Perceived Exertion 11-15      Progression   Progression Continue to progress workloads to maintain intensity without signs/symptoms of physical distress.      Resistance Training   Training Prescription Yes    Weight 3    Reps 10-15           Perform Capillary Blood Glucose checks as needed.  Exercise Prescription Changes:   Exercise Prescription Changes    Row Name 09/04/20 1200 09/11/20 1100 09/18/20 1200 10/02/20 1200 10/16/20 1300     Response to Exercise   Blood Pressure (Admit) 142/70 -- 110/60 142/70 138/76   Blood Pressure (Exercise) 164/72 -- 164/72 154/72 178/60   Blood Pressure (Exit) 150/60 -- 130/64 132/64 128/72   Heart Rate (Admit) 84 bpm -- 95 bpm 83 bpm 82 bpm   Heart Rate (Exercise) 96 bpm -- 123 bpm 113 bpm 113 bpm   Heart Rate (Exit) 88 bpm -- 96 bpm 90 bpm 91  bpm   Rating of Perceived Exertion (Exercise) 12 -- 11  11 12    Duration Continue with 30 min of aerobic exercise without signs/symptoms of physical distress. -- Continue with 30 min of aerobic exercise without signs/symptoms of physical distress. Continue with 30 min of aerobic exercise without signs/symptoms of physical distress. Continue with 30 min of aerobic exercise without signs/symptoms of physical distress.   Intensity THRR unchanged -- THRR unchanged THRR unchanged THRR unchanged     Progression   Progression Continue to progress workloads to maintain intensity without signs/symptoms of physical distress. -- Continue to progress workloads to maintain intensity without signs/symptoms of physical distress. Continue to progress workloads to maintain intensity without signs/symptoms of physical distress. Continue to progress workloads to maintain intensity without signs/symptoms of physical distress.     Resistance Training   Training Prescription Yes -- Yes Yes Yes   Weight 3 lbs -- 3 lbs 3 lbs 4 lb   Reps 10-15 -- 10-15 10-15 10-15   Time 10 Minutes -- 10 Minutes 10 Minutes 10 Minutes     NuStep   Level 1 -- 3 3 4    SPM 120 -- 131 183 173   Minutes 39 -- 39 39 17   METs 1.9 -- 1.9 2 2.2     Recumbant Elliptical   Level -- -- -- -- 2   RPM -- -- -- -- 60   Minutes -- -- -- -- 22   METs -- -- -- -- 3.3     Home Exercise Plan   Plans to continue exercise at -- Home (comment) -- -- --   Frequency -- Add 3 additional days to program exercise sessions. -- -- --   Initial Home Exercises Provided -- 09/11/20 -- -- --   [place] Name 10/30/20 1400             Response to Exercise   Blood Pressure (Admit) 140/76       Blood Pressure (Exercise) 162/78       Blood Pressure (Exit) 128/70       Heart Rate (Admit) 81 bpm       Heart Rate (Exercise) 107 bpm       Heart Rate (Exit) 90 bpm       Rating of Perceived Exertion (Exercise) 11       Duration Continue with 30 min of aerobic exercise without signs/symptoms of physical distress.        Intensity THRR unchanged               Progression   Progression Continue to progress workloads to maintain intensity without signs/symptoms of physical distress.               Resistance Training   Training Prescription Yes       Weight 5 lbs       Reps 10-15       Time 10 Minutes               Treadmill   MPH 1.8       Grade 0       Minutes 17       METs 2.38               Recumbant Elliptical   Level 3       RPM 58       Minutes 22       METs 5.3  Exercise Comments:   Exercise Comments    Row Name 08/25/20 1200 09/11/20 1209         Exercise Comments Patient completed first exercise session today. He tolerated exercise well and was excited to come back. patient and I reviewed home exercise program. Patient is not currently exercising at home, but will start doing light resistance training, biking and walking at home.             Exercise Goals and Review:   Exercise Goals    Row Name 08/21/20 1430 09/04/20 1224 10/02/20 0808 10/30/20 1402       Exercise Goals   Increase Physical Activity Yes Yes Yes Yes    Intervention Develop an individualized exercise prescription for aerobic and resistive training based on initial evaluation findings, risk stratification, comorbidities and participant's personal goals.;Provide advice, education, support and counseling about physical activity/exercise needs. Develop an individualized exercise prescription for aerobic and resistive training based on initial evaluation findings, risk stratification, comorbidities and participant's personal goals.;Provide advice, education, support and counseling about physical activity/exercise needs. Develop an individualized exercise prescription for aerobic and resistive training based on initial evaluation findings, risk stratification, comorbidities and participant's personal goals.;Provide advice, education, support and counseling about physical activity/exercise needs. Develop  an individualized exercise prescription for aerobic and resistive training based on initial evaluation findings, risk stratification, comorbidities and participant's personal goals.;Provide advice, education, support and counseling about physical activity/exercise needs.    Expected Outcomes Long Term: Add in home exercise to make exercise part of routine and to increase amount of physical activity.;Short Term: Attend rehab on a regular basis to increase amount of physical activity.;Long Term: Exercising regularly at least 3-5 days a week. Long Term: Add in home exercise to make exercise part of routine and to increase amount of physical activity.;Short Term: Attend rehab on a regular basis to increase amount of physical activity.;Long Term: Exercising regularly at least 3-5 days a week. Long Term: Add in home exercise to make exercise part of routine and to increase amount of physical activity.;Short Term: Attend rehab on a regular basis to increase amount of physical activity.;Long Term: Exercising regularly at least 3-5 days a week. Long Term: Add in home exercise to make exercise part of routine and to increase amount of physical activity.;Short Term: Attend rehab on a regular basis to increase amount of physical activity.;Long Term: Exercising regularly at least 3-5 days a week.    Increase Strength and Stamina Yes Yes Yes Yes    Intervention Provide advice, education, support and counseling about physical activity/exercise needs.;Develop an individualized exercise prescription for aerobic and resistive training based on initial evaluation findings, risk stratification, comorbidities and participant's personal goals. Provide advice, education, support and counseling about physical activity/exercise needs.;Develop an individualized exercise prescription for aerobic and resistive training based on initial evaluation findings, risk stratification, comorbidities and participant's personal goals. Provide advice,  education, support and counseling about physical activity/exercise needs.;Develop an individualized exercise prescription for aerobic and resistive training based on initial evaluation findings, risk stratification, comorbidities and participant's personal goals. Provide advice, education, support and counseling about physical activity/exercise needs.;Develop an individualized exercise prescription for aerobic and resistive training based on initial evaluation findings, risk stratification, comorbidities and participant's personal goals.    Expected Outcomes Short Term: Increase workloads from initial exercise prescription for resistance, speed, and METs.;Short Term: Perform resistance training exercises routinely during rehab and add in resistance training at home;Long Term: Improve cardiorespiratory fitness, muscular endurance and strength as measured by increased  METs and functional capacity (6MWT) Short Term: Increase workloads from initial exercise prescription for resistance, speed, and METs.;Short Term: Perform resistance training exercises routinely during rehab and add in resistance training at home;Long Term: Improve cardiorespiratory fitness, muscular endurance and strength as measured by increased METs and functional capacity (6MWT) Short Term: Increase workloads from initial exercise prescription for resistance, speed, and METs.;Short Term: Perform resistance training exercises routinely during rehab and add in resistance training at home;Long Term: Improve cardiorespiratory fitness, muscular endurance and strength as measured by increased METs and functional capacity (6MWT) Short Term: Increase workloads from initial exercise prescription for resistance, speed, and METs.;Short Term: Perform resistance training exercises routinely during rehab and add in resistance training at home;Long Term: Improve cardiorespiratory fitness, muscular endurance and strength as measured by increased METs and functional  capacity (6MWT)    Able to understand and use rate of perceived exertion (RPE) scale Yes Yes Yes Yes    Intervention Provide education and explanation on how to use RPE scale Provide education and explanation on how to use RPE scale Provide education and explanation on how to use RPE scale Provide education and explanation on how to use RPE scale    Expected Outcomes Short Term: Able to use RPE daily in rehab to express subjective intensity level;Long Term:  Able to use RPE to guide intensity level when exercising independently Short Term: Able to use RPE daily in rehab to express subjective intensity level;Long Term:  Able to use RPE to guide intensity level when exercising independently Short Term: Able to use RPE daily in rehab to express subjective intensity level;Long Term:  Able to use RPE to guide intensity level when exercising independently Short Term: Able to use RPE daily in rehab to express subjective intensity level;Long Term:  Able to use RPE to guide intensity level when exercising independently    Knowledge and understanding of Target Heart Rate Range (THRR) Yes Yes Yes Yes    Intervention Provide education and explanation of THRR including how the numbers were predicted and where they are located for reference Provide education and explanation of THRR including how the numbers were predicted and where they are located for reference Provide education and explanation of THRR including how the numbers were predicted and where they are located for reference Provide education and explanation of THRR including how the numbers were predicted and where they are located for reference    Expected Outcomes Short Term: Able to state/look up THRR;Long Term: Able to use THRR to govern intensity when exercising independently;Short Term: Able to use daily as guideline for intensity in rehab Short Term: Able to state/look up THRR;Long Term: Able to use THRR to govern intensity when exercising  independently;Short Term: Able to use daily as guideline for intensity in rehab Short Term: Able to state/look up THRR;Long Term: Able to use THRR to govern intensity when exercising independently;Short Term: Able to use daily as guideline for intensity in rehab Short Term: Able to state/look up THRR;Long Term: Able to use THRR to govern intensity when exercising independently;Short Term: Able to use daily as guideline for intensity in rehab    Able to check pulse independently Yes Yes Yes Yes    Intervention Provide education and demonstration on how to check pulse in carotid and radial arteries.;Review the importance of being able to check your own pulse for safety during independent exercise Provide education and demonstration on how to check pulse in carotid and radial arteries.;Review the importance of being able to check  your own pulse for safety during independent exercise Provide education and demonstration on how to check pulse in carotid and radial arteries.;Review the importance of being able to check your own pulse for safety during independent exercise Provide education and demonstration on how to check pulse in carotid and radial arteries.;Review the importance of being able to check your own pulse for safety during independent exercise    Expected Outcomes Short Term: Able to explain why pulse checking is important during independent exercise;Long Term: Able to check pulse independently and accurately Short Term: Able to explain why pulse checking is important during independent exercise;Long Term: Able to check pulse independently and accurately Short Term: Able to explain why pulse checking is important during independent exercise;Long Term: Able to check pulse independently and accurately Short Term: Able to explain why pulse checking is important during independent exercise;Long Term: Able to check pulse independently and accurately    Understanding of Exercise Prescription Yes Yes Yes Yes     Intervention Provide education, explanation, and written materials on patient's individual exercise prescription Provide education, explanation, and written materials on patient's individual exercise prescription Provide education, explanation, and written materials on patient's individual exercise prescription Provide education, explanation, and written materials on patient's individual exercise prescription    Expected Outcomes Short Term: Able to explain program exercise prescription;Long Term: Able to explain home exercise prescription to exercise independently Short Term: Able to explain program exercise prescription;Long Term: Able to explain home exercise prescription to exercise independently Short Term: Able to explain program exercise prescription;Long Term: Able to explain home exercise prescription to exercise independently Short Term: Able to explain program exercise prescription;Long Term: Able to explain home exercise prescription to exercise independently           Exercise Goals Re-Evaluation :  Exercise Goals Re-Evaluation    Row Name 09/04/20 1225 10/02/20 1200 10/30/20 1403         Exercise Goal Re-Evaluation   Exercise Goals Review Increase Physical Activity;Increase Strength and Stamina;Able to understand and use rate of perceived exertion (RPE) scale;Knowledge and understanding of Target Heart Rate Range (THRR);Able to check pulse independently;Understanding of Exercise Prescription Increase Physical Activity;Increase Strength and Stamina;Able to understand and use rate of perceived exertion (RPE) scale;Knowledge and understanding of Target Heart Rate Range (THRR);Able to check pulse independently;Understanding of Exercise Prescription Increase Physical Activity;Increase Strength and Stamina;Able to understand and use rate of perceived exertion (RPE) scale;Knowledge and understanding of Target Heart Rate Range (THRR);Able to check pulse independently;Understanding of Exercise  Prescription     Comments patient has completed 5 exercise sessions. He is tolerating exercise well and progressing well. He is enjoying the program and he is always enthusiastic about coming to rehab. He always is eager to get further in distance than he did the previous session. He also expressed interest in getting exercise equipment so he can exercise at home. He is currently exercising at 1.9 METs on the NuStep. Will continue to monitor and progress as able. Patient has completed 16 exercise sessions. He tolerates exercise well and is increasing intensities fairly quickly. He has been more interested in his heart rate while exercising lately. He asks questions about his heart rate and about exercise. He has increased to level 3 on the NuStep. He keeps track of his blood pressure readings at rehab and takes them home. He asks many questions about this and how it relates to his health and exercise. He is currently exercising at 2.0 METs on the NuStep. Will continue to  monitor and progress as able. Patient has completed 28 exercise sessions. He has progressed very well. He has started using the treadmill and the elliptical. He has progressed well on both. He needs to get more comfortable with the treadmill before increasing speeds. He really enjoyes the treadmill. He is still keeping record of his own blood pressures. He is very positive and great to be around. He is very interactive with the staff and others in his class. He is currently exercising at 5.3 METs on the elliptical. Will continue to monitor and progress as able.     Expected Outcomes Throughout exercise at rehab and a home exercise program, patient will achieve their goals. Throughout exercise at rehab and a home exercise program, patient will achieve their goals. Throughout exercise at rehab and a home exercise program, patient will achieve their goals.             Discharge Exercise Prescription (Final Exercise Prescription Changes):   Exercise Prescription Changes - 10/30/20 1400      Response to Exercise   Blood Pressure (Admit) 140/76    Blood Pressure (Exercise) 162/78    Blood Pressure (Exit) 128/70    Heart Rate (Admit) 81 bpm    Heart Rate (Exercise) 107 bpm    Heart Rate (Exit) 90 bpm    Rating of Perceived Exertion (Exercise) 11    Duration Continue with 30 min of aerobic exercise without signs/symptoms of physical distress.    Intensity THRR unchanged      Progression   Progression Continue to progress workloads to maintain intensity without signs/symptoms of physical distress.      Resistance Training   Training Prescription Yes    Weight 5 lbs    Reps 10-15    Time 10 Minutes      Treadmill   MPH 1.8    Grade 0    Minutes 17    METs 2.38      Recumbant Elliptical   Level 3    RPM 58    Minutes 22    METs 5.3           Nutrition:  Target Goals: Understanding of nutrition guidelines, daily intake of sodium 1500mg , cholesterol 200mg , calories 30% from fat and 7% or less from saturated fats, daily to have 5 or more servings of fruits and vegetables.  Biometrics:  Pre Biometrics - 10/30/20 1415      Pre Biometrics   Weight 92.1 kg            Nutrition Therapy Plan and Nutrition Goals:  Nutrition Therapy & Goals - 09/25/20 0817      Personal Nutrition Goals   Comments We provide 2 educational sessions with handouts on heart healthy nutrition.      Intervention Plan   Intervention Nutrition handout(s) given to patient.           Nutrition Assessments:  Nutrition Assessments - 08/21/20 1432      MEDFICTS Scores   Pre Score 114          MEDIFICTS Score Key:  ?70 Need to make dietary changes   40-70 Heart Healthy Diet  ? 40 Therapeutic Level Cholesterol Diet   Picture Your Plate Scores:  [id number] Unhealthy dietary pattern with much room for improvement.  41-50 Dietary pattern unlikely to meet recommendations for good health and room for improvement.  51-60 More  healthful dietary pattern, with some room for improvement.   >60 Healthy dietary pattern, although there may be  some specific behaviors that could be improved.    Nutrition Goals Re-Evaluation:   Nutrition Goals Discharge (Final Nutrition Goals Re-Evaluation):   Psychosocial: Target Goals: Acknowledge presence or absence of significant depression and/or stress, maximize coping skills, provide positive support system. Participant is able to verbalize types and ability to use techniques and skills needed for reducing stress and depression.  Initial Review & Psychosocial Screening:  Initial Psych Review & Screening - 08/21/20 1435      Initial Review   Current issues with None Identified      Family Dynamics   Good Support System? Yes    Comments Patient says his wife is his support system. He was very active prior to his CABG procedure and is looking forward to getting back to his activities. He denies any depression, anxiety, or stress. He has a very positive outlook regarding his future and is looking forward to participating in the program.      Barriers   Psychosocial barriers to participate in program There are no identifiable barriers or psychosocial needs.      Screening Interventions   Interventions Encouraged to exercise;To provide support and resources with identified psychosocial needs    Expected Outcomes Short Term goal: Identification and review with participant of any Quality of Life or Depression concerns found by scoring the questionnaire.           Quality of Life Scores:  Quality of Life - 08/23/20 0756      Quality of Life   Select Quality of Life      Quality of Life Scores   Health/Function Pre 27.21 %    Socioeconomic Pre 29.14 %    Psych/Spiritual Pre 29.64 %    Family Pre 27 %    GLOBAL Pre 28.14 %          Scores of 19 and below usually indicate a poorer quality of life in these areas.  A difference of  2-3 points is a clinically meaningful  difference.  A difference of 2-3 points in the total score of the Quality of Life Index has been associated with significant improvement in overall quality of life, self-image, physical symptoms, and general health in studies assessing change in quality of life.  PHQ-9: Recent Review Flowsheet Data    Depression screen [facility] 2/9 08/21/2020   Decreased Interest 0   Down, Depressed, Hopeless 0   PHQ - 2 Score 0   Altered sleeping 0   Tired, decreased energy 1   Change in appetite 0   Feeling bad or failure about yourself  0   Trouble concentrating 0   Moving slowly or fidgety/restless 0   Suicidal thoughts 0   PHQ-9 Score 1   Difficult doing work/chores Not difficult at all     Interpretation of Total Score  Total Score Depression Severity:  1-4 = Minimal depression, 5-9 = Mild depression, 10-14 = Moderate depression, 15-19 = Moderately severe depression, 20-27 = Severe depression   Psychosocial Evaluation and Intervention:  Psychosocial Evaluation - 08/21/20 1438      Psychosocial Evaluation & Interventions   Interventions Encouraged to exercise with the program and follow exercise prescription;Stress management education;Relaxation education    Comments Patient's initial QOL score was 28.14 overall and his PHQ-9 score was 1. He has no psychosocial issues identified at his orientation visit.    Expected Outcomes Patient will have no psychosocial issues identified at discharge.    Continue Psychosocial Services  No Follow up required  Psychosocial Re-Evaluation:  Psychosocial Re-Evaluation    Row Name 08/28/20 [phone] 09/25/20 0750 10/23/20 1320         Psychosocial Re-Evaluation   Current issues with None Identified -- --     Comments Patient is new the program completing 2 sessions. He seemed to enjoy his first sessions. Will continue to monitor his progress. Patient has completed 13 sessions doing well in the program. He continues to have no psychosocial barriers  identified and enjoys coming to class. He is very interactive with staff and others in his class. Will continue to monitor. Patient has completed 26 sessions doing well in the program. He continues to have no psychosocial barriers identified and enjoys coming to class. He is very interactive with staff and others in his class. Will continue to monitor.     Expected Outcomes Patient will have no psychosocial issues identified at discharge. Patient will have no psychosocial issues identified at discharge. Patient will have no psychosocial issues identified at discharge.     Interventions Stress management education;Encouraged to attend Cardiac Rehabilitation for the exercise;Relaxation education Stress management education;Encouraged to attend Cardiac Rehabilitation for the exercise;Relaxation education Stress management education;Encouraged to attend Cardiac Rehabilitation for the exercise;Relaxation education     Continue Psychosocial Services  No Follow up required No Follow up required No Follow up required            Psychosocial Discharge (Final Psychosocial Re-Evaluation):  Psychosocial Re-Evaluation - 10/23/20 1320      Psychosocial Re-Evaluation   Comments Patient has completed 26 sessions doing well in the program. He continues to have no psychosocial barriers identified and enjoys coming to class. He is very interactive with staff and others in his class. Will continue to monitor.    Expected Outcomes Patient will have no psychosocial issues identified at discharge.    Interventions Stress management education;Encouraged to attend Cardiac Rehabilitation for the exercise;Relaxation education    Continue Psychosocial Services  No Follow up required           Vocational Rehabilitation: Provide vocational rehab assistance to qualifying candidates.   Vocational Rehab Evaluation & Intervention:  Vocational Rehab - 08/21/20 1432      Initial Vocational Rehab Evaluation & Intervention    Assessment shows need for Vocational Rehabilitation No      Vocational Rehab Re-Evaulation   Comments Patient is retired and is not interested in returning to work. He does not need vocational rehab.           Education: Education Goals: Education classes will be provided on a weekly basis, covering required topics. Participant will state understanding/return demonstration of topics presented.  Learning Barriers/Preferences:  Learning Barriers/Preferences - 08/21/20 1432      Learning Barriers/Preferences   Learning Barriers None    Learning Preferences Written Material;Audio           Education Topics: Hypertension, Hypertension Reduction -Define heart disease and high blood pressure. Discus how high blood pressure affects the body and ways to reduce high blood pressure. Flowsheet Row CARDIAC REHAB PHASE II EXERCISE from 10/25/2020 in [place]  Date 10/04/20  Educator mk  Instruction Review Code 2- Demonstrated Understanding      Exercise and Your Heart -Discuss why it is important to exercise, the FITT principles of exercise, normal and abnormal responses to exercise, and how to exercise safely. Flowsheet Row CARDIAC REHAB PHASE II EXERCISE from 10/25/2020 in [place]  Date 10/11/20  Educator mk  Instruction Review  Code 2- Demonstrated Understanding      Angina -Discuss definition of angina, causes of angina, treatment of angina, and how to decrease risk of having angina. Flowsheet Row CARDIAC REHAB PHASE II EXERCISE from 10/25/2020 in [employer]  Date 10/18/20  Educator mk  Instruction Review Code 2- Demonstrated Understanding      Cardiac Medications -Review what the following cardiac medications are used for, how they affect the body, and side effects that may occur when taking the medications.  Medications include Aspirin, Beta blockers, calcium channel blockers, ACE Inhibitors, angiotensin  receptor blockers, diuretics, digoxin, and antihyperlipidemics. Flowsheet Row CARDIAC REHAB PHASE II EXERCISE from 10/25/2020 in [place]  Date 10/25/20  Educator mk  Instruction Review Code 2- Demonstrated Understanding      Congestive Heart Failure -Discuss the definition of CHF, how to live with CHF, the signs and symptoms of CHF, and how keep track of weight and sodium intake.   Heart Disease and Intimacy -Discus the effect sexual activity has on the heart, how changes occur during intimacy as we age, and safety during sexual activity.   Smoking Cessation / COPD -Discuss different methods to quit smoking, the health benefits of quitting smoking, and the definition of COPD.   Nutrition I: Fats -Discuss the types of cholesterol, what cholesterol does to the heart, and how cholesterol levels can be controlled.   Nutrition II: Labels -Discuss the different components of food labels and how to read food label [place] from 10/25/2020 in [place]  Date 08/30/20  Educator DJ  Instruction Review Code 1- Verbalizes Understanding      Heart Parts/Heart Disease and PAD -Discuss the anatomy of the heart, the pathway of blood circulation through the heart, and these are affected by heart disease.   Stress I: Signs and Symptoms -Discuss the causes of stress, how stress may lead to anxiety and depression, and ways to limit stress.   Stress II: Relaxation -Discuss different types of relaxation techniques to limit stress. Flowsheet Row CARDIAC REHAB PHASE II EXERCISE from 10/25/2020 in [place]  Date 09/20/20  Educator mk  Instruction Review Code 1- Verbalizes Understanding      Warning Signs of Stroke / TIA -Discuss definition of a stroke, what the signs and symptoms are of a stroke, and how to identify when someone is having stroke. Flowsheet Row CARDIAC REHAB PHASE II  EXERCISE from 10/25/2020 in [place]  Date 09/27/20  Educator mk  Instruction Review Code 2- Demonstrated Understanding      Knowledge Questionnaire Score:  Knowledge Questionnaire Score - 08/21/20 1432      Knowledge Questionnaire Score   Pre Score 22/24           Core Components/Risk Factors/Patient Goals at Admission:  Personal Goals and Risk Factors at Admission - 08/21/20 1433      Core Components/Risk Factors/Patient Goals on Admission    Weight Management Weight Maintenance    Lipids Yes    Intervention Provide education and support for participant on nutrition & aerobic/resistive exercise along with prescribed medications to achieve LDL 70mg , HDL >40mg .    Expected Outcomes Short Term: Participant states understanding of desired cholesterol values and is compliant with medications prescribed. Participant is following exercise prescription and nutrition guidelines.    Personal Goal Other Yes    Personal Goal Get stronger; have more energy. Get back to where he was before his surgery  with his activities and energy levels.    Intervention Provide monitored exercise, blood pressure monitoring and education regarding lifestyle changes for risk modification.    Expected Outcomes Patient will completing the program meeting both program and personal goals.           Core Components/Risk Factors/Patient Goals Review:   Goals and Risk Factor Review    Row Name 08/28/20 0845 09/25/20 [id number] 10/23/20 1320         Core Components/Risk Factors/Patient Goals Review   Personal Goals Review Lipids;Other Lipids;Other Lipids;Other     Review Patient is new to the program completing 2 sessions. 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 He is participating in the program for risk modification. His personal goals for doing the program are to get stronger and get back to where he was prior to his CABG. Will continue to monitor his  progress as he works towards meeting these goals. Patient has completed 13 sessions gaining 1 lb since last 30 day review. He is doing well in the program with consistent attendance and progression. His blood pressure has improved since Dr. Domenic Polite added amlodipine 5 mg. He is staying better controlled during exercise as well. He says he is feeling stronger. His personal goals for the program are to get stronger and to get back to where he was prior to his CABG. He is looking forward to planting his garden soon. Will continue to monitor for progress as he works toward meeting these goals. Patient has completed 26 sessions maintaining his weight since last 30 day review. He continues to do well in the program with consistent attendance and progressions. He saw Dr. Domenic Polite 10/05/20 for a routine follow up visit. His systolic b/p was [id number] at this visit. He is usually higher in CR with average systolic [id number]. His HDL has shown improvement per office note. Dr. Domenic Polite did add ASA 325 mg to reduce to 81 mg in 6/22. Patient says he is doing all he wants to do and is getting ready to start his gardening. He is very pleased with his progress in the program. His personal goals for the program are to get stronger; get back to where he was prior to his procedure and he feels he has reached this goal. Will continue to monitor for progress.     Expected Outcomes Patient will complete the program meeting both program and personal goals. Patient will complete the program meeting both program and personal goals. Patient will complete the program meeting both program and personal goals.            Core Components/Risk Factors/Patient Goals at Discharge (Final Review):   Goals and Risk Factor Review - 10/23/20 1320      Core Components/Risk Factors/Patient Goals Review   Personal Goals Review Lipids;Other    Review Patient has completed 26 sessions maintaining his weight since last 30 day review. He continues to do well in  the program with consistent attendance and progressions. He saw Dr. Domenic Polite 10/05/20 for a routine follow up visit. His systolic b/p was [id number] at this visit. He is usually higher in CR with average systolic [id number]. His HDL has shown improvement per office note. Dr. Domenic Polite did add ASA 325 mg to reduce to 81 mg in 6/22. Patient says he is doing all he wants to do and is getting ready to start his gardening. He is very pleased with his progress in the program. His personal goals for  the program are to get stronger; get back to where he was prior to his procedure and he feels he has reached this goal. Will continue to monitor for progress.    Expected Outcomes Patient will complete the program meeting both program and personal goals.           ITP Comments:   Comments: ITP REVIEW Pt is making expected progress toward Cardiac Rehab goals after completing 29 sessions. Recommend continued exercise, life style modification, education, and increased stamina and strength.

## 2020-11-01 NOTE — Progress Notes (Signed)
Daily Session Note  Patient Details  Name: Carl Young MRN: [id number] Date of Birth: February 11, 1941 Referring Provider:   Flowsheet Row CARDIAC REHAB PHASE II ORIENTATION from 08/21/2020 in [place]  Referring Provider Dr. Cyndia Bent      Encounter Date: 11/01/2020  Check In:  Session Check In - 11/01/20 1100      Check-In   Supervising physician immediately available to respond to emergencies [facility] MD immediately available    Physician(s) Dr. Harl Bowie    Location [facility]    Staff Present Cathren Harsh, MS, Exercise Physiologist;Debra Wynetta Emery, RN, BSN    Virtual Visit No    Medication changes reported     No    Comments --    Fall or balance concerns reported    No    Tobacco Cessation No Change    Warm-up and Cool-down Performed as group-led instruction    Resistance Training Performed Yes    VAD Patient? No    PAD/SET Patient? No      Pain Assessment   Currently in Pain? No/denies    Pain Score 0-No pain    Multiple Pain Sites No           Capillary Blood Glucose: No results found for this or any previous visit (from the past 24 hour(s)).    Social History   Tobacco Use  Smoking Status Former Smoker  . Types: Cigarettes, Cigars  . Quit date: 07/08/1985  . Years since quitting: 35.3  Smokeless Tobacco Never Used    Goals Met:  Independence with exercise equipment Exercise tolerated well No report of cardiac concerns or symptoms Strength training completed today  Goals Unmet:  Not Applicable  Comments: check out 1200   Dr. Kathie Dike is Medical Director for [facility] Pulmonary Rehab.

## 2020-11-03 ENCOUNTER — Encounter (HOSPITAL_COMMUNITY)
Admission: RE | Admit: 2020-11-03 | Discharge: 2020-11-03 | Disposition: A | Discharge status: Home / Self Care | Payer: Medicare Other | Source: Ambulatory Visit | Attending: Cardiology | Admitting: Cardiology

## 2020-11-03 ENCOUNTER — Other Ambulatory Visit: Payer: Self-pay

## 2020-11-03 DIAGNOSIS — Z951 Presence of aortocoronary bypass graft: Secondary | ICD-10-CM

## 2020-11-03 NOTE — Progress Notes (Signed)
Daily Session Note  Patient Details  Name: Carl Young MRN: [id number] Date of Birth: 1941-04-20 Referring Provider:   Flowsheet Row CARDIAC REHAB PHASE II ORIENTATION from 08/21/2020 in [place]  Referring Provider Dr. Cyndia Bent      Encounter Date: 11/03/2020  Check In:  Session Check In - 11/03/20 1100      Check-In   Supervising physician immediately available to respond to emergencies [facility] MD immediately available    Physician(s) Dr. Harl Bowie    Location [facility]    Staff Present Cathren Harsh, MS, Exercise Physiologist;Debra Wynetta Emery, RN, BSN    Virtual Visit No    Medication changes reported     No    Fall or balance concerns reported    No    Tobacco Cessation No Change    Warm-up and Cool-down Performed as group-led instruction    Resistance Training Performed Yes    VAD Patient? No    PAD/SET Patient? No      Pain Assessment   Currently in Pain? No/denies    Pain Score 0-No pain    Multiple Pain Sites No           Capillary Blood Glucose: No results found for this or any previous visit (from the past 24 hour(s)).    Social History   Tobacco Use  Smoking Status Former Smoker  . Types: Cigarettes, Cigars  . Quit date: 07/08/1985  . Years since quitting: 35.3  Smokeless Tobacco Never Used    Goals Met:  Independence with exercise equipment Exercise tolerated well No report of cardiac concerns or symptoms Strength training completed today  Goals Unmet:  Not Applicable  Comments: check out 1200   Dr. Kathie Dike is Medical Director for [facility] Pulmonary Rehab.

## 2020-11-06 ENCOUNTER — Other Ambulatory Visit: Payer: Self-pay

## 2020-11-06 ENCOUNTER — Encounter (HOSPITAL_COMMUNITY)
Admission: RE | Admit: 2020-11-06 | Discharge: 2020-11-06 | Disposition: A | Discharge status: Home / Self Care | Payer: Medicare Other | Source: Ambulatory Visit | Attending: Cardiology | Admitting: Cardiology

## 2020-11-06 DIAGNOSIS — Z951 Presence of aortocoronary bypass graft: Secondary | ICD-10-CM

## 2020-11-06 NOTE — Progress Notes (Signed)
Daily Session Note  Patient Details  Name: Carl Young MRN: [id number] Date of Birth: Nov 14, 1940 Referring Provider:   Flowsheet Row CARDIAC REHAB PHASE II ORIENTATION from 08/21/2020 in [place]  Referring Provider Dr. Cyndia Bent      Encounter Date: 11/06/2020  Check In:  Session Check In - 11/06/20 1100      Check-In   Supervising physician immediately available to respond to emergencies [facility] MD immediately available    Physician(s) Dr. Domenic Polite    Location [facility]    Staff Present Cathren Harsh, MS, Exercise Physiologist;Debra Wynetta Emery, RN, BSN    Virtual Visit No    Medication changes reported     No    Fall or balance concerns reported    No    Tobacco Cessation No Change    Warm-up and Cool-down Performed as group-led instruction    Resistance Training Performed Yes    VAD Patient? No    PAD/SET Patient? No      Pain Assessment   Currently in Pain? No/denies    Pain Score 0-No pain    Multiple Pain Sites No           Capillary Blood Glucose: No results found for this or any previous visit (from the past 24 hour(s)).    Social History   Tobacco Use  Smoking Status Former Smoker  . Types: Cigarettes, Cigars  . Quit date: 07/08/1985  . Years since quitting: 35.3  Smokeless Tobacco Never Used    Goals Met:  Independence with exercise equipment Exercise tolerated well No report of cardiac concerns or symptoms Strength training completed today  Goals Unmet:  Not Applicable  Comments: check out 1200   Dr. Kathie Dike is Medical Director for [facility] Pulmonary Rehab.

## 2020-11-08 ENCOUNTER — Encounter (HOSPITAL_COMMUNITY)
Admission: RE | Admit: 2020-11-08 | Discharge: 2020-11-08 | Disposition: A | Discharge status: Home / Self Care | Payer: Medicare Other | Source: Ambulatory Visit | Attending: Cardiology | Admitting: Cardiology

## 2020-11-08 ENCOUNTER — Other Ambulatory Visit: Payer: Self-pay

## 2020-11-08 DIAGNOSIS — Z951 Presence of aortocoronary bypass graft: Secondary | ICD-10-CM

## 2020-11-08 NOTE — Progress Notes (Signed)
Daily Session Note  Patient Details  Name: Carl Young MRN: [id number] Date of Birth: September 26, 1940 Referring Provider:   Flowsheet Row CARDIAC REHAB PHASE II ORIENTATION from 08/21/2020 in [place]  Referring Provider Dr. Cyndia Bent      Encounter Date: 11/08/2020  Check In:  Session Check In - 11/08/20 1100      Check-In   Supervising physician immediately available to respond to emergencies [facility] MD immediately available    Physician(s) Dr. Harl Bowie    Location [facility]    Staff Present Cathren Harsh, MS, Exercise Physiologist;Debra Wynetta Emery, RN, BSN    Virtual Visit No    Medication changes reported     No    Fall or balance concerns reported    No    Tobacco Cessation No Change    Warm-up and Cool-down Performed as group-led instruction    Resistance Training Performed Yes    VAD Patient? No    PAD/SET Patient? No      Pain Assessment   Currently in Pain? No/denies    Pain Score 0-No pain    Multiple Pain Sites No           Capillary Blood Glucose: No results found for this or any previous visit (from the past 24 hour(s)).    Social History   Tobacco Use  Smoking Status Former Smoker  . Types: Cigarettes, Cigars  . Quit date: 07/08/1985  . Years since quitting: 35.3  Smokeless Tobacco Never Used    Goals Met:  Independence with exercise equipment Exercise tolerated well No report of cardiac concerns or symptoms Strength training completed today  Goals Unmet:  Not Applicable  Comments: check out 1200   Dr. Kathie Dike is Medical Director for [facility] Pulmonary Rehab.

## 2020-11-10 ENCOUNTER — Other Ambulatory Visit: Payer: Self-pay

## 2020-11-10 ENCOUNTER — Encounter (HOSPITAL_COMMUNITY)
Admission: RE | Admit: 2020-11-10 | Discharge: 2020-11-10 | Disposition: A | Discharge status: Home / Self Care | Payer: Medicare Other | Source: Ambulatory Visit | Attending: Cardiology | Admitting: Cardiology

## 2020-11-10 DIAGNOSIS — Z951 Presence of aortocoronary bypass graft: Secondary | ICD-10-CM

## 2020-11-10 NOTE — Progress Notes (Signed)
Daily Session Note  Patient Details  Name: Carl Young MRN: [id number] Date of Birth: 1941/06/23 Referring Provider:   Flowsheet Row CARDIAC REHAB PHASE II ORIENTATION from 08/21/2020 in [place]  Referring Provider Dr. Cyndia Bent      Encounter Date: 11/10/2020  Check In:  Session Check In - 11/10/20 1100      Check-In   Supervising physician immediately available to respond to emergencies [facility] MD immediately available    Physician(s) Dr. Domenic Polite    Location [facility]    Staff Present Cathren Harsh, MS, Exercise Physiologist;Debra Wynetta Emery, RN, BSN    Virtual Visit No    Medication changes reported     No    Fall or balance concerns reported    No    Tobacco Cessation No Change    Warm-up and Cool-down Performed as group-led instruction    Resistance Training Performed Yes    VAD Patient? No    PAD/SET Patient? No      Pain Assessment   Currently in Pain? No/denies    Pain Score 0-No pain    Multiple Pain Sites No           Capillary Blood Glucose: No results found for this or any previous visit (from the past 24 hour(s)).    Social History   Tobacco Use  Smoking Status Former Smoker  . Types: Cigarettes, Cigars  . Quit date: 07/08/1985  . Years since quitting: 35.3  Smokeless Tobacco Never Used    Goals Met:  Independence with exercise equipment Exercise tolerated well No report of cardiac concerns or symptoms Strength training completed today  Goals Unmet:  Not Applicable  Comments: checkout 1200   Dr. Kathie Dike is Medical Director for [facility] Pulmonary Rehab.

## 2020-11-13 ENCOUNTER — Other Ambulatory Visit: Payer: Self-pay

## 2020-11-13 ENCOUNTER — Encounter (HOSPITAL_COMMUNITY)
Admission: RE | Admit: 2020-11-13 | Discharge: 2020-11-13 | Disposition: A | Discharge status: Home / Self Care | Payer: Medicare Other | Source: Ambulatory Visit | Attending: Cardiology | Admitting: Cardiology

## 2020-11-13 VITALS — Wt 204.1 lb

## 2020-11-13 DIAGNOSIS — Z951 Presence of aortocoronary bypass graft: Secondary | ICD-10-CM | POA: Diagnosis not present

## 2020-11-13 NOTE — Progress Notes (Signed)
Daily Session Note  Patient Details  Name: Carl Young MRN: [id number] Date of Birth: 1941-01-28 Referring Provider:   Flowsheet Row CARDIAC REHAB PHASE II ORIENTATION from 08/21/2020 in [place]  Referring Provider Dr. Cyndia Bent      Encounter Date: 11/13/2020  Check In:  Session Check In - 11/13/20 1100      Check-In   Supervising physician immediately available to respond to emergencies [facility] MD immediately available    Physician(s) Dr. Johnsie Cancel    Location [facility]    Staff Present Geanie Cooley, RN;Debra Wynetta Emery, RN, BSN;[place] Audria Nine, MS, Exercise Physiologist    Virtual Visit No    Medication changes reported     No    Resistance Training Performed Yes    VAD Patient? No    PAD/SET Patient? No      Pain Assessment   Currently in Pain? No/denies    Pain Score 0-No pain    Multiple Pain Sites No           Capillary Blood Glucose: No results found for this or any previous visit (from the past 24 hour(s)).    Social History   Tobacco Use  Smoking Status Former Smoker  . Types: Cigarettes, Cigars  . Quit date: 07/08/1985  . Years since quitting: 35.3  Smokeless Tobacco Never Used    Goals Met:  Independence with exercise equipment Exercise tolerated well No report of cardiac concerns or symptoms Strength training completed today  Goals Unmet:  Not Applicable  Comments: check out @ 12:00   Dr. Kathie Dike is Medical Director for [facility] Pulmonary Rehab.

## 2020-11-15 ENCOUNTER — Encounter (HOSPITAL_COMMUNITY)
Admission: RE | Admit: 2020-11-15 | Discharge: 2020-11-15 | Disposition: A | Discharge status: Home / Self Care | Payer: Medicare Other | Source: Ambulatory Visit | Attending: Cardiology | Admitting: Cardiology

## 2020-11-15 ENCOUNTER — Other Ambulatory Visit: Payer: Self-pay

## 2020-11-15 DIAGNOSIS — Z951 Presence of aortocoronary bypass graft: Secondary | ICD-10-CM | POA: Diagnosis not present

## 2020-11-15 NOTE — Progress Notes (Signed)
Daily Session Note  Patient Details  Name: Carl Young MRN: [id number] Date of Birth: [birth year] Referring Provider:   Flowsheet Row CARDIAC REHAB PHASE II ORIENTATION from 08/21/2020 in [place]  Referring Provider Dr. Cyndia Bent      Encounter Date: 11/15/2020  Check In:  Session Check In - 11/15/20 1100      Check-In   Supervising physician immediately available to respond to emergencies [facility] MD immediately available    Physician(s) Dr. Harl Bowie    Location [facility]    Staff Present Aundra Dubin, RN, BSN;Perez Dirico Audria Nine, MS, Exercise Physiologist    Virtual Visit No    Medication changes reported     No    Fall or balance concerns reported    No    Tobacco Cessation No Change    Warm-up and Cool-down Performed as group-led instruction    Resistance Training Performed Yes    VAD Patient? No    PAD/SET Patient? No      Pain Assessment   Currently in Pain? No/denies    Pain Score 0-No pain    Multiple Pain Sites No           Capillary Blood Glucose: No results found for this or any previous visit (from the past 24 hour(s)).    Social History   Tobacco Use  Smoking Status Former Smoker  . Types: Cigarettes, Cigars  . Quit date: 07/08/1985  . Years since quitting: 35.3  Smokeless Tobacco Never Used    Goals Met:  Independence with exercise equipment Exercise tolerated well No report of cardiac concerns or symptoms Strength training completed today  Goals Unmet:  Not Applicable  Comments: checkout 1200   Dr. Kathie Dike is Medical Director for [facility].

## 2020-11-17 NOTE — Progress Notes (Signed)
Discharge Progress Report  Patient Details  Name: Carl Young MRN: [id number] Date of Birth: May 30, 1941 Referring Provider:   Flowsheet Row CARDIAC REHAB PHASE II ORIENTATION from 08/21/2020 in [place]  Referring Provider Dr. Cyndia Bent       Number of Visits: 36  Reason for Discharge:  Patient reached a stable level of exercise. Patient independent in their exercise. Patient has met program and personal goals.  Smoking History:  Social History   Tobacco Use  Smoking Status Former Smoker  . Types: Cigarettes, Cigars  . Quit date: 07/08/1985  . Years since quitting: 35.3  Smokeless Tobacco Never Used    Diagnosis:  S/P CABG x 5  ADL UCSD:   Initial Exercise Prescription:  Initial Exercise Prescription - 08/21/20 1400      Date of Initial Exercise RX and Referring Provider   Date 08/21/20    Referring Provider Dr. Cyndia Bent    Expected Discharge Date 11/15/20      NuStep   Level 1    SPM 80    Minutes 39      Prescription Details   Frequency (times per week) 3    Duration Progress to 30 minutes of continuous aerobic without signs/symptoms of physical distress      Intensity   THRR 40-80% of Max Heartrate 55-110    Ratings of Perceived Exertion 11-15      Progression   Progression Continue to progress workloads to maintain intensity without signs/symptoms of physical distress.      Resistance Training   Training Prescription Yes    Weight 3    Reps 10-15           Discharge Exercise Prescription (Final Exercise Prescription Changes):  Exercise Prescription Changes - 11/13/20 1200      Response to Exercise   Blood Pressure (Admit) 160/80    Blood Pressure (Exercise) 178/74    Blood Pressure (Exit) 140/70    Heart Rate (Admit) 76 bpm    Heart Rate (Exercise) 107 bpm    Heart Rate (Exit) 98 bpm    Rating of Perceived Exertion (Exercise) 10    Duration Continue with 30 min of aerobic exercise without signs/symptoms of  physical distress.    Intensity THRR unchanged      Progression   Progression Continue to progress workloads to maintain intensity without signs/symptoms of physical distress.      Resistance Training   Training Prescription Yes    Weight 5 lbds    Reps 10-15    Time 10 Minutes      Recumbant Elliptical   Level 3    RPM 65    Minutes 22    METs 5.5           Functional Capacity:  6 Minute Walk    Row Name 08/21/20 1427 11/13/20 1131       6 Minute Walk   Phase Initial Discharge    Distance 1000 feet 1200 feet    Walk Time 6 minutes 6 minutes    # of Rest Breaks 0 0    MPH 1.9 2.3    METS 1.27 2.33    RPE 10 10    VO2 Peak 4.43 8.16    Symptoms No No    Resting HR 81 bpm 78 bpm    Resting BP 170/84 160/80    Resting Oxygen Saturation  96 % 96 %    Exercise Oxygen Saturation  during 6 min walk 97 %  96 %    Max Ex. HR 88 bpm 93 bpm    Max Ex. BP 188/82 178/74    2 Minute Post BP 166/80 140/76           Psychological, QOL, Others - Outcomes: PHQ 2/9: Depression screen [facility] 2/9 11/17/2020 08/21/2020  Decreased Interest 0 0  Down, Depressed, Hopeless 0 0  PHQ - 2 Score 0 0  Altered sleeping 0 0  Tired, decreased energy 0 1  Change in appetite 0 0  Feeling bad or failure about yourself  0 0  Trouble concentrating 0 0  Moving slowly or fidgety/restless 0 0  Suicidal thoughts 0 0  PHQ-9 Score 0 1  Difficult doing work/chores - Not difficult at all    Quality of Life:  Quality of Life - 11/17/20 0931      Quality of Life   Select Quality of Life      Quality of Life Scores   Health/Function Pre 27.21 %    Health/Function Post 27.63 %    Health/Function % Change 1.54 %    Socioeconomic Pre 29.14 %    Socioeconomic Post 30 %    Socioeconomic % Change  2.95 %    Psych/Spiritual Pre 29.64 %    Psych/Spiritual Post 28.93 %    Psych/Spiritual % Change -2.4 %    Family Pre 27 %    Family Post 28.5 %    Family % Change 5.56 %    GLOBAL Pre 28.14 %     GLOBAL Post 28.47 %    GLOBAL % Change 1.17 %           Personal Goals: Goals established at orientation with interventions provided to work toward goal.  Personal Goals and Risk Factors at Admission - 08/21/20 1433      Core Components/Risk Factors/Patient Goals on Admission    Weight Management Weight Maintenance    Lipids Yes    Intervention Provide education and support for participant on nutrition & aerobic/resistive exercise along with prescribed medications to achieve LDL <60m, HDL >4101m    Expected Outcomes Short Term: Participant states understanding of desired cholesterol values and is compliant with medications prescribed. Participant is following exercise prescription and nutrition guidelines.    Personal Goal Other Yes    Personal Goal Get stronger; have more energy. Get back to where he was before his surgery with his activities and energy levels.    Intervention Provide monitored exercise, blood pressure monitoring and education regarding lifestyle changes for risk modification.    Expected Outcomes Patient will completing the program meeting both program and personal goals.            Personal Goals Discharge:  Goals and Risk Factor Review    Row Name 08/28/20 0845 09/25/20 0899244/25/22 1320 11/17/20 0937       Core Components/Risk Factors/Patient Goals Review   Personal Goals Review Lipids;Other Lipids;Other Lipids;Other Lipids;Other    Review Patient is new to the program completing 2 sessions. 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 He is participating in the program for risk modification. His personal goals for doing the program are to get stronger and get back to where he was prior to his CABG. Will continue to monitor his progress as he works towards meeting these goals. Patient has completed 13 sessions gaining 1 lb since last 30 day review. He is doing well in the program with consistent attendance and progression.  His blood  pressure has improved since Dr. Domenic Polite added amlodipine 5 mg. He is staying better controlled during exercise as well. He says he is feeling stronger. His personal goals for the program are to get stronger and to get back to where he was prior to his CABG. He is looking forward to planting his garden soon. Will continue to monitor for progress as he works toward meeting these goals. Patient has completed 26 sessions maintaining his weight since last 30 day review. He continues to do well in the program with consistent attendance and progressions. He saw Dr. Domenic Polite 10/05/20 for a routine follow up visit. His systolic b/p was 409 at this visit. He is usually higher in CR with average systolic 811. His HDL has shown improvement per office note. Dr. Domenic Polite did add ASA 325 mg to reduce to 81 mg in 6/22. Patient says he is doing all he wants to do and is getting ready to start his gardening. He is very pleased with his progress in the program. His personal goals for the program are to get stronger; get back to where he was prior to his procedure and he feels he has reached this goal. Will continue to monitor for progress. Patient had completed 36 sessions before discharge. His goals in the program were to get stronger and to get back to where he was before his procedure. He has reached that goal and is going to continue to work toward [employer]. He has started his gardening and says that he feels much stronger and able to do that since entering thr program.    Expected Outcomes Patient will complete the program meeting both program and personal goals. Patient will complete the program meeting both program and personal goals. Patient will complete the program meeting both program and personal goals. Patient will complete the program meeting both program and personal goals.           Exercise Goals and Review:  Exercise Goals    Row Name 08/21/20 1430 09/04/20 1224 10/02/20 0808 10/30/20 1402        Exercise Goals   Increase Physical Activity Yes Yes Yes Yes    Intervention Develop an individualized exercise prescription for aerobic and resistive training based on initial evaluation findings, risk stratification, comorbidities and participant's personal goals.;Provide advice, education, support and counseling about physical activity/exercise needs. Develop an individualized exercise prescription for aerobic and resistive training based on initial evaluation findings, risk stratification, comorbidities and participant's personal goals.;Provide advice, education, support and counseling about physical activity/exercise needs. Develop an individualized exercise prescription for aerobic and resistive training based on initial evaluation findings, risk stratification, comorbidities and participant's personal goals.;Provide advice, education, support and counseling about physical activity/exercise needs. Develop an individualized exercise prescription for aerobic and resistive training based on initial evaluation findings, risk stratification, comorbidities and participant's personal goals.;Provide advice, education, support and counseling about physical activity/exercise needs.    Expected Outcomes Long Term: Add in home exercise to make exercise part of routine and to increase amount of physical activity.;Short Term: Attend rehab on a regular basis to increase amount of physical activity.;Long Term: Exercising regularly at least 3-5 days a week. Long Term: Add in home exercise to make exercise part of routine and to increase amount of physical activity.;Short Term: Attend rehab on a regular basis to increase amount of physical activity.;Long Term: Exercising regularly at least 3-5 days a week. Long Term: Add in home exercise to make exercise part of routine and to  increase amount of physical activity.;Short Term: Attend rehab on a regular basis to increase amount of physical activity.;Long Term: Exercising  regularly at least 3-5 days a week. Long Term: Add in home exercise to make exercise part of routine and to increase amount of physical activity.;Short Term: Attend rehab on a regular basis to increase amount of physical activity.;Long Term: Exercising regularly at least 3-5 days a week.    Increase Strength and Stamina Yes Yes Yes Yes    Intervention Provide advice, education, support and counseling about physical activity/exercise needs.;Develop an individualized exercise prescription for aerobic and resistive training based on initial evaluation findings, risk stratification, comorbidities and participant's personal goals. Provide advice, education, support and counseling about physical activity/exercise needs.;Develop an individualized exercise prescription for aerobic and resistive training based on initial evaluation findings, risk stratification, comorbidities and participant's personal goals. Provide advice, education, support and counseling about physical activity/exercise needs.;Develop an individualized exercise prescription for aerobic and resistive training based on initial evaluation findings, risk stratification, comorbidities and participant's personal goals. Provide advice, education, support and counseling about physical activity/exercise needs.;Develop an individualized exercise prescription for aerobic and resistive training based on initial evaluation findings, risk stratification, comorbidities and participant's personal goals.    Expected Outcomes Short Term: Increase workloads from initial exercise prescription for resistance, speed, and METs.;Short Term: Perform resistance training exercises routinely during rehab and add in resistance training at home;Long Term: Improve cardiorespiratory fitness, muscular endurance and strength as measured by increased METs and functional capacity (6MWT) Short Term: Increase workloads from initial exercise prescription for resistance, speed, and  METs.;Short Term: Perform resistance training exercises routinely during rehab and add in resistance training at home;Long Term: Improve cardiorespiratory fitness, muscular endurance and strength as measured by increased METs and functional capacity (6MWT) Short Term: Increase workloads from initial exercise prescription for resistance, speed, and METs.;Short Term: Perform resistance training exercises routinely during rehab and add in resistance training at home;Long Term: Improve cardiorespiratory fitness, muscular endurance and strength as measured by increased METs and functional capacity (6MWT) Short Term: Increase workloads from initial exercise prescription for resistance, speed, and METs.;Short Term: Perform resistance training exercises routinely during rehab and add in resistance training at home;Long Term: Improve cardiorespiratory fitness, muscular endurance and strength as measured by increased METs and functional capacity (6MWT)    Able to understand and use rate of perceived exertion (RPE) scale Yes Yes Yes Yes    Intervention Provide education and explanation on how to use RPE scale Provide education and explanation on how to use RPE scale Provide education and explanation on how to use RPE scale Provide education and explanation on how to use RPE scale    Expected Outcomes Short Term: Able to use RPE daily in rehab to express subjective intensity level;Long Term:  Able to use RPE to guide intensity level when exercising independently Short Term: Able to use RPE daily in rehab to express subjective intensity level;Long Term:  Able to use RPE to guide intensity level when exercising independently Short Term: Able to use RPE daily in rehab to express subjective intensity level;Long Term:  Able to use RPE to guide intensity level when exercising independently Short Term: Able to use RPE daily in rehab to express subjective intensity level;Long Term:  Able to use RPE to guide intensity level when  exercising independently    Knowledge and understanding of Target Heart Rate Range (THRR) Yes Yes Yes Yes    Intervention Provide education and explanation of THRR including how the numbers  were predicted and where they are located for reference Provide education and explanation of THRR including how the numbers were predicted and where they are located for reference Provide education and explanation of THRR including how the numbers were predicted and where they are located for reference Provide education and explanation of THRR including how the numbers were predicted and where they are located for reference    Expected Outcomes Short Term: Able to state/look up THRR;Long Term: Able to use THRR to govern intensity when exercising independently;Short Term: Able to use daily as guideline for intensity in rehab Short Term: Able to state/look up THRR;Long Term: Able to use THRR to govern intensity when exercising independently;Short Term: Able to use daily as guideline for intensity in rehab Short Term: Able to state/look up THRR;Long Term: Able to use THRR to govern intensity when exercising independently;Short Term: Able to use daily as guideline for intensity in rehab Short Term: Able to state/look up THRR;Long Term: Able to use THRR to govern intensity when exercising independently;Short Term: Able to use daily as guideline for intensity in rehab    Able to check pulse independently Yes Yes Yes Yes    Intervention Provide education and demonstration on how to check pulse in carotid and radial arteries.;Review the importance of being able to check your own pulse for safety during independent exercise Provide education and demonstration on how to check pulse in carotid and radial arteries.;Review the importance of being able to check your own pulse for safety during independent exercise Provide education and demonstration on how to check pulse in carotid and radial arteries.;Review the importance of being able  to check your own pulse for safety during independent exercise Provide education and demonstration on how to check pulse in carotid and radial arteries.;Review the importance of being able to check your own pulse for safety during independent exercise    Expected Outcomes Short Term: Able to explain why pulse checking is important during independent exercise;Long Term: Able to check pulse independently and accurately Short Term: Able to explain why pulse checking is important during independent exercise;Long Term: Able to check pulse independently and accurately Short Term: Able to explain why pulse checking is important during independent exercise;Long Term: Able to check pulse independently and accurately Short Term: Able to explain why pulse checking is important during independent exercise;Long Term: Able to check pulse independently and accurately    Understanding of Exercise Prescription Yes Yes Yes Yes    Intervention Provide education, explanation, and written materials on patient's individual exercise prescription Provide education, explanation, and written materials on patient's individual exercise prescription Provide education, explanation, and written materials on patient's individual exercise prescription Provide education, explanation, and written materials on patient's individual exercise prescription    Expected Outcomes Short Term: Able to explain program exercise prescription;Long Term: Able to explain home exercise prescription to exercise independently Short Term: Able to explain program exercise prescription;Long Term: Able to explain home exercise prescription to exercise independently Short Term: Able to explain program exercise prescription;Long Term: Able to explain home exercise prescription to exercise independently Short Term: Able to explain program exercise prescription;Long Term: Able to explain home exercise prescription to exercise independently           Exercise Goals  Re-Evaluation:  Exercise Goals Re-Evaluation    [place] Name 09/04/20 1225 10/02/20 1200 10/30/20 1403 11/17/20 0932       Exercise Goal Re-Evaluation   Exercise Goals Review Increase Physical Activity;Increase Strength and Stamina;Able to understand and use  rate of perceived exertion (RPE) scale;Knowledge and understanding of Target Heart Rate Range (THRR);Able to check pulse independently;Understanding of Exercise Prescription Increase Physical Activity;Increase Strength and Stamina;Able to understand and use rate of perceived exertion (RPE) scale;Knowledge and understanding of Target Heart Rate Range (THRR);Able to check pulse independently;Understanding of Exercise Prescription Increase Physical Activity;Increase Strength and Stamina;Able to understand and use rate of perceived exertion (RPE) scale;Knowledge and understanding of Target Heart Rate Range (THRR);Able to check pulse independently;Understanding of Exercise Prescription Increase Physical Activity;Increase Strength and Stamina;Able to understand and use rate of perceived exertion (RPE) scale;Knowledge and understanding of Target Heart Rate Range (THRR);Able to check pulse independently;Understanding of Exercise Prescription    Comments patient has completed 5 exercise sessions. He is tolerating exercise well and progressing well. He is enjoying the program and he is always enthusiastic about coming to rehab. He always is eager to get further in distance than he did the previous session. He also expressed interest in getting exercise equipment so he can exercise at home. He is currently exercising at 1.9 METs on the NuStep. Will continue to monitor and progress as able. Patient has completed 16 exercise sessions. He tolerates exercise well and is increasing intensities fairly quickly. He has been more interested in his heart rate while exercising lately. He asks questions about his heart rate and about exercise. He has increased to level 3 on the  NuStep. He keeps track of his blood pressure readings at rehab and takes them home. He asks many questions about this and how it relates to his health and exercise. He is currently exercising at 2.0 METs on the NuStep. Will continue to monitor and progress as able. Patient has completed 28 exercise sessions. He has progressed very well. He has started using the treadmill and the elliptical. He has progressed well on both. He needs to get more comfortable with the treadmill before increasing speeds. He really enjoyes the treadmill. He is still keeping record of his own blood pressures. He is very positive and great to be around. He is very interactive with the staff and others in his class. He is currently exercising at 5.3 METs on the elliptical. Will continue to monitor and progress as able. Patient completed 36 sessions before discharge. He progressed well in the program increasing his intensities, his functional capacity, and his strength throughout the program. He maintained his heart rate WNL with exercise. His blood pressures were WNL with exercise as well. He keeps track of his own blood pressures before and after exercise. He was very interactive in class and always had a positive attitude. He ended exercising at 5.6 METs on the elliptical. He is going to continue exercising at home.    Expected Outcomes Throughout exercise at rehab and a home exercise program, patient will achieve their goals. Throughout exercise at rehab and a home exercise program, patient will achieve their goals. Throughout exercise at rehab and a home exercise program, patient will achieve their goals. Throughout exercise at rehab and a home exercise program, patient will achieve their goals.           Nutrition & Weight - Outcomes:  Pre Biometrics - 11/13/20 1132      Pre Biometrics   Weight 92.6 kg    Waist Circumference 41 inches    Hip Circumference 41.75 inches    Waist to Hip Ratio 0.98 %    Triceps Skinfold 8 mm     % Body Fat 25.9 %    Grip Strength  42 kg    Flexibility 11 in    Single Leg Stand 7 seconds            Nutrition:  Nutrition Therapy & Goals - 09/25/20 0817      Personal Nutrition Goals   Comments We provide 2 educational sessions with handouts on heart healthy nutrition.      Intervention Plan   Intervention Nutrition handout(s) given to patient.           Nutrition Discharge:  Nutrition Assessments - 11/17/20 0930      MEDFICTS Scores   Pre Score 114    Post Score 48    Score Difference -66           Education Questionnaire Score:  Knowledge Questionnaire Score - 11/17/20 0930      Knowledge Questionnaire Score   Pre Score 22/24    Post Score 22/24          Patient graduated from [place] today on 11-15-20 after completing 36 sessions. They achieved LTG of 30 minutes of aerobic exercise at Max Met level of 5.6. All patients vitals are WNL. Discharge instruction has been reviewed in detail and patient stated an understanding of material given. Patient plans to exercise at home. Cardiac Rehab staff will make f/u call. Patient had no complaints of any abnormal S/S or pain on their exit visit.     Goals reviewed with patient; copy given to patient.

## 2020-12-14 ENCOUNTER — Telehealth: Payer: Self-pay | Admitting: Cardiology

## 2020-12-14 MED ORDER — ASPIRIN EC 81 MG PO TBEC: 81.0000 mg | DELAYED_RELEASE_TABLET | Freq: Every day | ORAL | 3 refills | Status: DC

## 2020-12-14 NOTE — Telephone Encounter (Signed)
Aspirin 81 mg rx sent to [facility]

## 2020-12-14 NOTE — Telephone Encounter (Signed)
Pt is decreasing his Aspirin to 81mg  and would like a new Rx sent to [facility]    Also, pt would like to chop some wood in the mornings- since his sugery was in Dec 2021 would he  be ok to increase his work load?  Please call [phone]

## 2020-12-14 NOTE — Telephone Encounter (Signed)
Patient informed and verbalized understanding of plan. 

## 2020-12-14 NOTE — Telephone Encounter (Signed)
     Given that he is barely 6 months out from CABG, would recommend not lifting more than 25 pounds. If he has a wood splitter and is able to use this without lifting more than 25 pounds, I think that would be appropriate. I would not recommend that he swing an axe until further out from surgery.   Signed, Erma Heritage, PA-C 12/14/2020, 12:02 PM

## 2021-01-12 DIAGNOSIS — Z299 Encounter for prophylactic measures, unspecified: Secondary | ICD-10-CM | POA: Diagnosis not present

## 2021-01-12 DIAGNOSIS — I10 Essential (primary) hypertension: Secondary | ICD-10-CM | POA: Diagnosis not present

## 2021-01-12 DIAGNOSIS — Z87891 Personal history of nicotine dependence: Secondary | ICD-10-CM | POA: Diagnosis not present

## 2021-01-12 DIAGNOSIS — I25119 Atherosclerotic heart disease of native coronary artery with unspecified angina pectoris: Secondary | ICD-10-CM | POA: Diagnosis not present

## 2021-01-12 DIAGNOSIS — E7800 Pure hypercholesterolemia, unspecified: Secondary | ICD-10-CM | POA: Diagnosis not present

## 2021-01-15 DIAGNOSIS — H903 Sensorineural hearing loss, bilateral: Secondary | ICD-10-CM | POA: Diagnosis not present

## 2021-02-03 ENCOUNTER — Other Ambulatory Visit: Payer: Self-pay | Admitting: Cardiology

## 2021-03-12 DIAGNOSIS — I700 Atherosclerosis of aorta: Secondary | ICD-10-CM | POA: Diagnosis not present

## 2021-03-12 DIAGNOSIS — Z299 Encounter for prophylactic measures, unspecified: Secondary | ICD-10-CM | POA: Diagnosis not present

## 2021-03-12 DIAGNOSIS — Z7189 Other specified counseling: Secondary | ICD-10-CM | POA: Diagnosis not present

## 2021-03-12 DIAGNOSIS — R5383 Other fatigue: Secondary | ICD-10-CM | POA: Diagnosis not present

## 2021-03-12 DIAGNOSIS — I10 Essential (primary) hypertension: Secondary | ICD-10-CM | POA: Diagnosis not present

## 2021-03-12 DIAGNOSIS — Z79899 Other long term (current) drug therapy: Secondary | ICD-10-CM | POA: Diagnosis not present

## 2021-03-12 DIAGNOSIS — E7800 Pure hypercholesterolemia, unspecified: Secondary | ICD-10-CM | POA: Diagnosis not present

## 2021-03-12 DIAGNOSIS — Z0000 Encounter for general adult medical examination without abnormal findings: Secondary | ICD-10-CM | POA: Diagnosis not present

## 2021-04-05 DIAGNOSIS — I10 Essential (primary) hypertension: Secondary | ICD-10-CM | POA: Diagnosis not present

## 2021-04-05 DIAGNOSIS — E7800 Pure hypercholesterolemia, unspecified: Secondary | ICD-10-CM | POA: Diagnosis not present

## 2021-04-05 DIAGNOSIS — Z299 Encounter for prophylactic measures, unspecified: Secondary | ICD-10-CM | POA: Diagnosis not present

## 2021-04-05 DIAGNOSIS — K921 Melena: Secondary | ICD-10-CM | POA: Diagnosis not present

## 2021-04-05 DIAGNOSIS — R5383 Other fatigue: Secondary | ICD-10-CM | POA: Diagnosis not present

## 2021-04-09 DIAGNOSIS — D225 Melanocytic nevi of trunk: Secondary | ICD-10-CM | POA: Diagnosis not present

## 2021-04-09 DIAGNOSIS — X32XXXD Exposure to sunlight, subsequent encounter: Secondary | ICD-10-CM | POA: Diagnosis not present

## 2021-04-09 DIAGNOSIS — L570 Actinic keratosis: Secondary | ICD-10-CM | POA: Diagnosis not present

## 2021-04-09 DIAGNOSIS — C4441 Basal cell carcinoma of skin of scalp and neck: Secondary | ICD-10-CM | POA: Diagnosis not present

## 2021-04-10 ENCOUNTER — Ambulatory Visit: Payer: Medicare Other | Admitting: Cardiology

## 2021-04-10 ENCOUNTER — Encounter: Payer: Self-pay | Admitting: Cardiology

## 2021-04-10 ENCOUNTER — Encounter: Payer: Self-pay | Admitting: *Deleted

## 2021-04-10 VITALS — BP 144/80 | HR 81 | Ht 71.0 in | Wt 202.0 lb

## 2021-04-10 DIAGNOSIS — I25119 Atherosclerotic heart disease of native coronary artery with unspecified angina pectoris: Secondary | ICD-10-CM | POA: Diagnosis not present

## 2021-04-10 DIAGNOSIS — N1832 Chronic kidney disease, stage 3b: Secondary | ICD-10-CM | POA: Diagnosis not present

## 2021-04-10 DIAGNOSIS — E782 Mixed hyperlipidemia: Secondary | ICD-10-CM

## 2021-04-10 MED ORDER — METOPROLOL SUCCINATE ER 25 MG PO TB24: 25.0000 mg | ORAL_TABLET | Freq: Every day | ORAL | 3 refills | Status: DC

## 2021-04-10 NOTE — Progress Notes (Signed)
Cardiology Office Note  Date: 04/10/2021   ID: Carl Young, DOB 1941/04/14, MRN [id number]  PCP:  Glenda Chroman, MD  Cardiologist:  Rozann Lesches, MD Electrophysiologist:  None   Chief Complaint  Patient presents with   Cardiac follow-up    History of Present Illness: Carl Young is an 80 y.o. male last seen in April.  He is here with his wife for a follow-up visit.  He does not report any angina symptoms.  Other than minor, occasional thoracic postsurgical aches exacerbated by some activities outdoors, he has otherwise healed well and eager to increase his activity.  He tolerated cardiac rehabilitation well.  I reviewed his medications which are noted below.  He reports compliance with therapy, no obvious intolerance.  We are requesting his most recent lab work from Dr. Woody Seller.  He does not describe any palpitations or dizziness.  Past Medical History:  Diagnosis Date   Bell's palsy    CAD (coronary artery disease)    Multivessel status post CABG 05/2020 - LIMA to LAD, SVG to ramus intermedius, SVG to OM 2 and OM 3, and SVG to PDA   CKD (chronic kidney disease) stage 3, GFR 30-59 ml/min (HCC)    Essential hypertension    Helicobacter pylori (H. pylori) infection    History of GI bleed    Mixed hyperlipidemia    Nephrolithiasis    Postoperative atrial fibrillation [facility])     Past Surgical History:  Procedure Laterality Date   APPENDECTOMY     COLONOSCOPY  05/06/2012   Procedure: COLONOSCOPY;  Surgeon: Rogene Houston, MD;  [facility];  Service: Endoscopy;  Laterality: N/A;  730   COLONOSCOPY N/A 11/03/2019   Procedure: COLONOSCOPY;  Surgeon: Rogene Houston, MD;  [facility];  Service: Endoscopy;  Laterality: N/A;  930-rescheduled 5/5 same time per office   CORONARY ARTERY BYPASS GRAFT N/A 06/12/2020   Procedure: CORONARY ARTERY BYPASS GRAFTING (CABG), ON PUMP, TIMES FIVE, USING LEFT INTERNAL MAMMARY ARTERY AND ENDOSCOPICALLY HARVESTED  RIGHT GREATER SAPHENOUS VEIN;  Surgeon: Gaye Pollack, MD;  Location: [place];  Service: Open Heart Surgery;  Laterality: N/A;   HAMMER TOE SURGERY Left 07/09/2017   Procedure: HAMMER TOE CORRECTION 2ND DIGIT LEFT FOOT;  Surgeon: Caprice Beaver, DPM;  [facility];  Service: Podiatry;  Laterality: Left;   LEFT HEART CATH AND CORONARY ANGIOGRAPHY N/A 06/09/2020   Procedure: LEFT HEART CATH AND CORONARY ANGIOGRAPHY;  Surgeon: Troy Sine, MD;  Location: [place] [facility];  Service: Cardiovascular;  Laterality: N/A;   METATARSAL HEAD EXCISION Left 08/25/2019   Procedure: METATARSAL HEAD RESECTION OF THE 2ND AND 3RD METATARSAL LEFT FOOT;  Surgeon: Caprice Beaver, DPM;  [facility];  Service: Podiatry;  Laterality: Left;   POLYPECTOMY  11/03/2019   Procedure: POLYPECTOMY;  Surgeon: Rogene Houston, MD;  [facility];  Service: Endoscopy;;   TEE WITHOUT CARDIOVERSION N/A 06/12/2020   Procedure: TRANSESOPHAGEAL ECHOCARDIOGRAM (TEE);  Surgeon: Gaye Pollack, MD;  Location: [place];  Service: Open Heart Surgery;  Laterality: N/A;   WEIL OSTEOTOMY Left 07/09/2017   Procedure: WEIL OSTEOTOMY 2ND METATARSAL LEFT FOOT;  Surgeon: Caprice Beaver, DPM;  [facility];  Service: Podiatry;  Laterality: Left;    Current Outpatient Medications  Medication Sig Dispense Refill   amLODipine (NORVASC) 5 MG tablet TAKE 1 TABLET BY MOUTH DAILY 90 tablet 2   ascorbic acid (VITAMIN C) 1000 MG tablet Take  1,000 mg by mouth daily.      aspirin EC 81 MG tablet Take 1 tablet (81 mg total) by mouth daily. Swallow whole. 90 tablet 3   benazepril (LOTENSIN) 40 MG tablet Take 40 mg by mouth daily.   4   Cholecalciferol (VITAMIN D3) 50 MCG (2000 UT) capsule Take 2,000 Units by mouth every evening.      fenofibrate (TRICOR) 145 MG tablet Take 145 mg by mouth every evening.      fish oil-omega-3 fatty acids 1000 MG capsule Take 1 g by mouth at bedtime.      folic acid (FOLVITE) 177 MCG tablet Take  400 mcg by mouth daily.      Garlic 9390 MG CAPS Take 1,000 mg by mouth at bedtime.      Lysine 500 MG CAPS Take 500 mg by mouth daily.      Multiple Vitamin (MULTIVITAMIN WITH MINERALS) TABS Take 1 tablet by mouth daily.     Multiple Vitamins-Minerals (PRESERVISION AREDS 2+MULTI VIT PO) Take 1 capsule by mouth 2 (two) times daily.     rosuvastatin (CRESTOR) 40 MG tablet Take 1 tablet (40 mg total) by mouth daily. 30 tablet 1   zinc gluconate 50 MG tablet Take 50 mg by mouth every evening.      metoprolol succinate (TOPROL-XL) 25 MG 24 hr tablet Take 1 tablet (25 mg total) by mouth daily. 90 tablet 3   No current facility-administered medications for this visit.   Allergies:  Ciprofloxacin in d5w   ROS: No orthopnea or PND.  Physical Exam: VS:  BP (!) 144/80   Pulse 81   Ht 5\' 11"  (1.803 m)   Wt 202 lb (91.6 kg)   SpO2 97%   BMI 28.17 kg/m , BMI Body mass index is 28.17 kg/m.  Wt Readings from Last 3 Encounters:  04/10/21 202 lb (91.6 kg)  11/13/20 204 lb 2.3 oz (92.6 kg)  10/30/20 203 lb (92.1 kg)    General: Patient appears comfortable at rest. HEENT: Conjunctiva and lids normal, wearing a mask. Neck: Supple, no elevated JVP or carotid bruits, no thyromegaly. Proximal: Well-healed sternal incision, stable. Lungs: Clear to auscultation, nonlabored breathing at rest. Cardiac: Regular rate and rhythm, no S3, 1/6 systolic murmur, no pericardial rub. Extremities: No pitting edema.  ECG:  An ECG dated 08/02/2020 was personally reviewed today and demonstrated:  Sinus rhythm.  Recent Labwork: 06/13/2020: Magnesium 2.3 06/18/2020: BUN 20; Creatinine, Ser 1.28; Hemoglobin 9.4; Platelets 218; Potassium 4.3; Sodium 141  March 2022: Cholesterol 164, triglycerides 216, HDL 36, LDL 91, BUN 18, creatinine 1.32, potassium 4.6, AST 31, ALT 21  Other Studies Reviewed Today:  Echocardiogram 06/10/2020:  1. Left ventricular ejection fraction, by estimation, is 55 to 60%. The  left  ventricle has normal function. The left ventricle has no regional  wall motion abnormalities. There is mild concentric left ventricular  hypertrophy. Left ventricular diastolic  parameters are consistent with Grade I diastolic dysfunction (impaired  relaxation).   2. Right ventricular systolic function is normal. The right ventricular  size is normal. There is normal pulmonary artery systolic pressure. The  estimated right ventricular systolic pressure is 30.0 mmHg.   3. The mitral valve is normal in structure. Mild mitral valve  regurgitation. No evidence of mitral stenosis.   4. The aortic valve is normal in structure. There is severe calcifcation  of the aortic valve. There is severe thickening of the aortic valve.  Aortic valve regurgitation is trivial. Mild aortic  valve stenosis. Aortic  valve mean gradient measures 11.0  mmHg.   5. The inferior vena cava is normal in size with greater than 50%  respiratory variability, suggesting right atrial pressure of 3 mmHg.   Assessment and Plan:  1.  Multivessel CAD status post CABG in December 2021.  LVEF 50 to 55%.  He is doing well with plan to increase activity at home.  He tolerated cardiac rehabilitation.  Continue aspirin, Toprol-XL, Norvasc, Lotensin, and Crestor.  2.  Mixed hyperlipidemia, on Tricor and Crestor.  Requesting recent lab work from Dr. Woody Seller.  3.  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  Medication Adjustments/Labs and Tests Ordered: Current medicines are reviewed at length with the patient today.  Concerns regarding medicines are outlined above.   Tests Ordered: No orders of the defined types were placed in this encounter.   Medication Changes: Meds ordered this encounter  Medications   metoprolol succinate (TOPROL-XL) 25 MG 24 hr tablet    Sig: Take 1 tablet (25 mg total) by mouth daily.    Dispense:  90 tablet    Refill:  3     Disposition:  Follow up  6 months.  Signed, Satira Sark, MD,  [facility] 04/10/2021 2:08 PM    [facility] [address], [place], [place] [address] Phone: [phone]; Fax: [phone]

## 2021-04-10 NOTE — Patient Instructions (Addendum)

## 2021-04-13 DIAGNOSIS — H2513 Age-related nuclear cataract, bilateral: Secondary | ICD-10-CM | POA: Diagnosis not present

## 2021-05-06 IMAGING — DX DG CHEST 2V
2 series · 2 of 2 positions shown · non-contrast
Comparison: 06/14/2020; 06/13/2020; 06/12/2020; 06/11/2020

CLINICAL DATA: Post CABG on 06/12/2020

EXAM:
CHEST - 2 VIEW

[dg chest 2 view (1 of 2)]
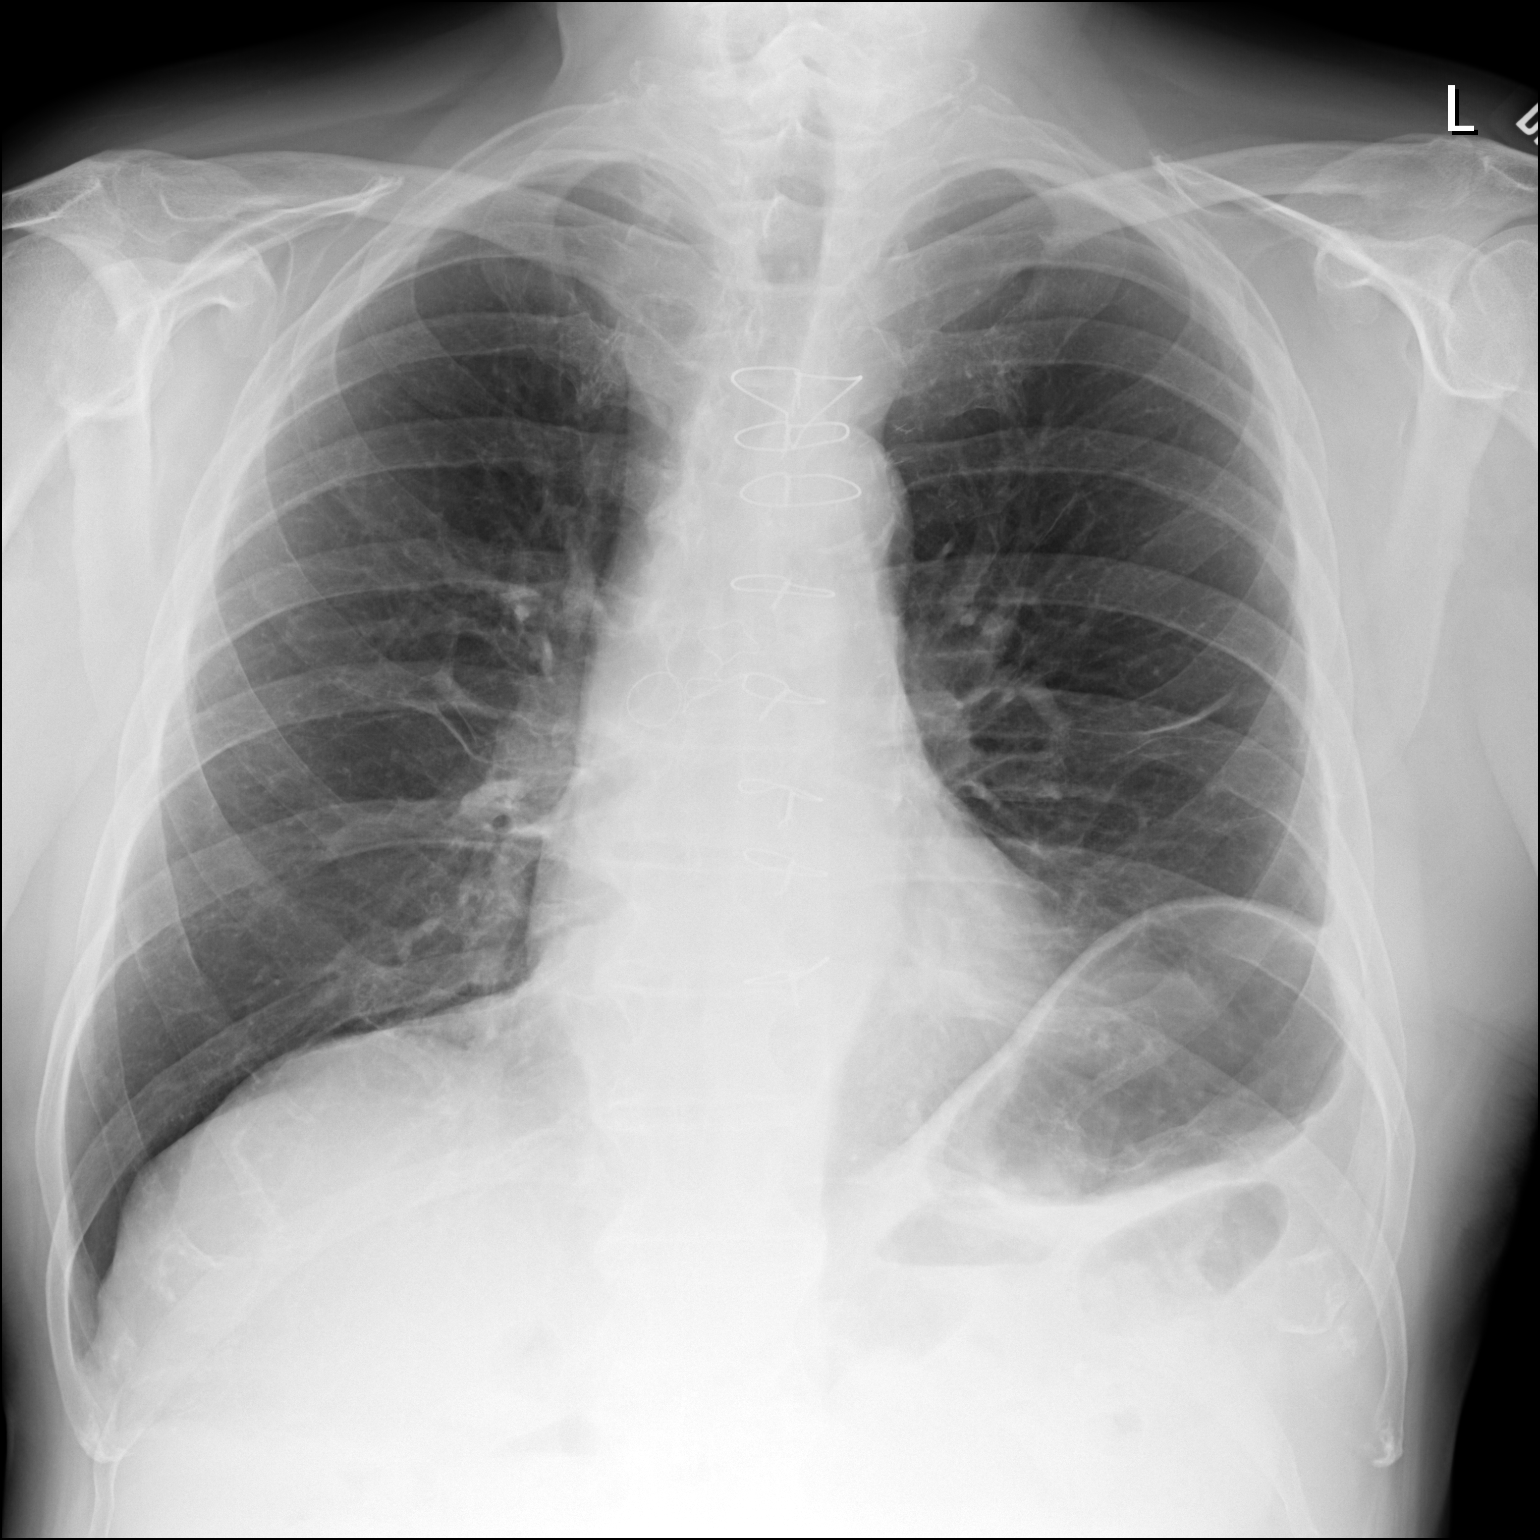

[dg chest 2 view (2 of 2)]
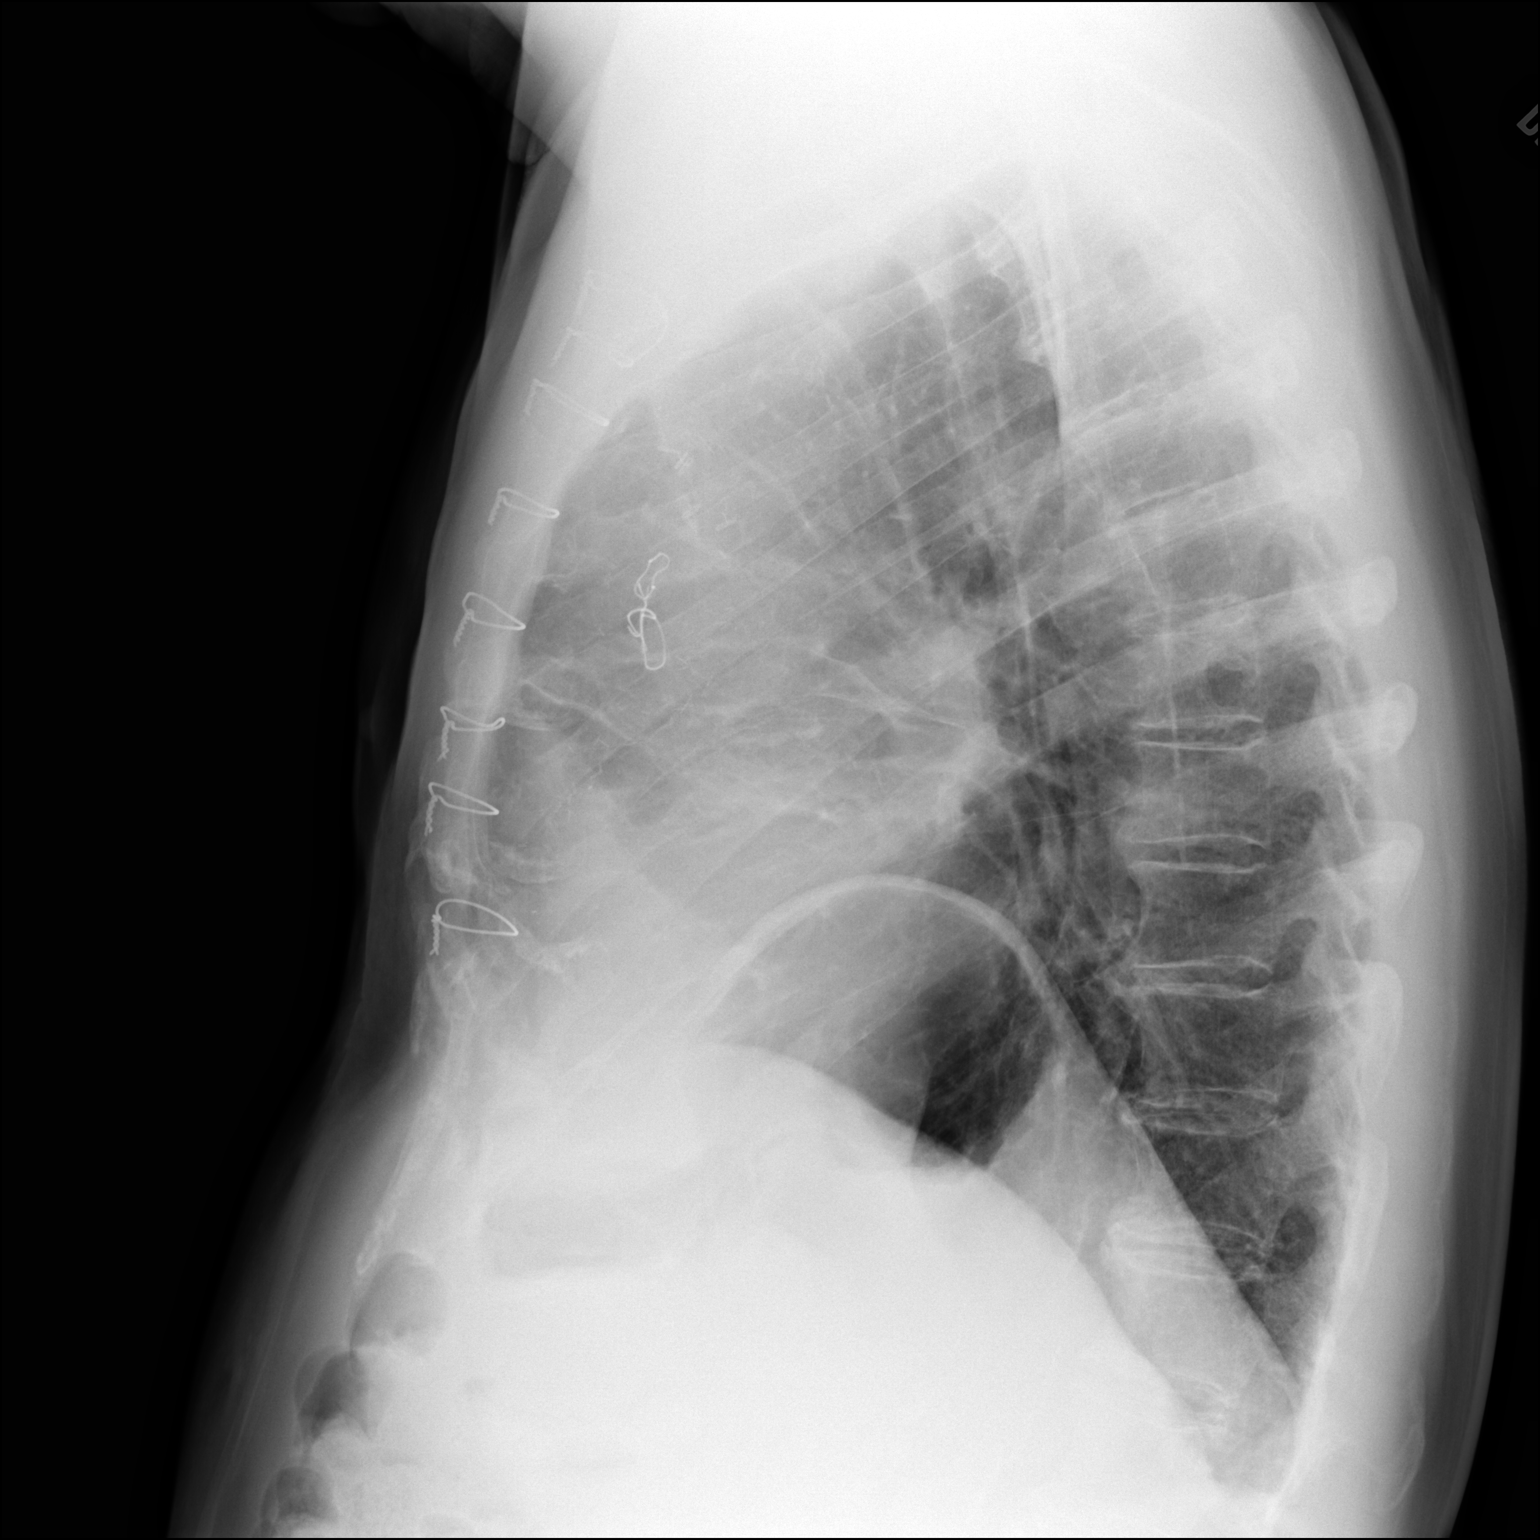

[2 of 2 positions shown; findings below may reference images not displayed]

FINDINGS: Grossly unchanged cardiac silhouette and mediastinal contours post
median sternotomy and CABG. Atherosclerotic plaque within the
thoracic aorta. There is persistent mild elevation/eventration of
left hemidiaphragm with blunting of the left costophrenic angle.
Improved aeration of the lung bases. Linear heterogeneous opacities
within the left mid lung favored to represent atelectasis or scar.
No new focal airspace opacities. No definite pleural effusion or
pneumothorax. No evidence of edema. No acute osseous abnormalities.
IMPRESSION: Improved aeration of the lung bases with persistent left basilar
atelectasis/scar.

## 2021-05-16 DIAGNOSIS — C4441 Basal cell carcinoma of skin of scalp and neck: Secondary | ICD-10-CM | POA: Diagnosis not present

## 2021-06-08 DIAGNOSIS — I700 Atherosclerosis of aorta: Secondary | ICD-10-CM | POA: Diagnosis not present

## 2021-06-08 DIAGNOSIS — I10 Essential (primary) hypertension: Secondary | ICD-10-CM | POA: Diagnosis not present

## 2021-06-08 DIAGNOSIS — Z299 Encounter for prophylactic measures, unspecified: Secondary | ICD-10-CM | POA: Diagnosis not present

## 2021-06-08 DIAGNOSIS — I4891 Unspecified atrial fibrillation: Secondary | ICD-10-CM | POA: Diagnosis not present

## 2021-06-08 DIAGNOSIS — J069 Acute upper respiratory infection, unspecified: Secondary | ICD-10-CM | POA: Diagnosis not present

## 2021-07-17 ENCOUNTER — Ambulatory Visit (INDEPENDENT_AMBULATORY_CARE_PROVIDER_SITE_OTHER): Payer: Medicare Other

## 2021-07-17 ENCOUNTER — Other Ambulatory Visit: Payer: Self-pay

## 2021-07-17 ENCOUNTER — Ambulatory Visit: Payer: Medicare Other | Admitting: Podiatry

## 2021-07-17 DIAGNOSIS — L97521 Non-pressure chronic ulcer of other part of left foot limited to breakdown of skin: Secondary | ICD-10-CM | POA: Diagnosis not present

## 2021-07-17 DIAGNOSIS — M21619 Bunion of unspecified foot: Secondary | ICD-10-CM | POA: Diagnosis not present

## 2021-07-17 DIAGNOSIS — M79672 Pain in left foot: Secondary | ICD-10-CM

## 2021-07-21 NOTE — Progress Notes (Signed)
Subjective:   Patient ID: Carl Young, male   DOB: 81 y.o.   MRN: [id number]   HPI 81 year old male presents the office with concerns of ulcer to his left big toe but his main concern is that the big toe has been turning.  He previously had a wound on his second toe and this resulted in the surgery started on July 09, 2017.  He then went back to have surgery in February 2021 to removed metatarsal heads 2 and 3.  The wound that had previously did heal with the surgery.  Since then the big toe has been drifting to the side resulting in a callus now with open wound.  The wound itself has been getting better.  He has been treated by Dr. Caprice Beaver for this and was referred for further evaluation of this as well as for straightening of the big toe.  Currently denies any drainage or pus.  No fevers or chills.  No other concerns today.  Has a history of quadruple bypass December 2021   Review of Systems  All other systems reviewed and are negative.  Past Medical History:  Diagnosis Date   Bell's palsy    CAD (coronary artery disease)    Multivessel status post CABG 05/2020 - LIMA to LAD, SVG to ramus intermedius, SVG to OM 2 and OM 3, and SVG to PDA   CKD (chronic kidney disease) stage 3, GFR 30-59 ml/min (HCC)    Essential hypertension    Helicobacter pylori (H. pylori) infection    History of GI bleed    Mixed hyperlipidemia    Nephrolithiasis    Postoperative atrial fibrillation [facility])     Past Surgical History:  Procedure Laterality Date   APPENDECTOMY     COLONOSCOPY  05/06/2012   Procedure: COLONOSCOPY;  Surgeon: Rogene Houston, MD;  [facility];  Service: Endoscopy;  Laterality: N/A;  730   COLONOSCOPY N/A 11/03/2019   Procedure: COLONOSCOPY;  Surgeon: Rogene Houston, MD;  [facility];  Service: Endoscopy;  Laterality: N/A;  930-rescheduled 5/5 same time per office   CORONARY ARTERY BYPASS GRAFT N/A 06/12/2020   Procedure: CORONARY ARTERY BYPASS GRAFTING  (CABG), ON PUMP, TIMES FIVE, USING LEFT INTERNAL MAMMARY ARTERY AND ENDOSCOPICALLY HARVESTED RIGHT GREATER SAPHENOUS VEIN;  Surgeon: Gaye Pollack, MD;  Location: [place];  Service: Open Heart Surgery;  Laterality: N/A;   HAMMER TOE SURGERY Left 07/09/2017   Procedure: HAMMER TOE CORRECTION 2ND DIGIT LEFT FOOT;  Surgeon: Caprice Beaver, DPM;  [facility];  Service: Podiatry;  Laterality: Left;   LEFT HEART CATH AND CORONARY ANGIOGRAPHY N/A 06/09/2020   Procedure: LEFT HEART CATH AND CORONARY ANGIOGRAPHY;  Surgeon: Troy Sine, MD;  Location: [place] [facility];  Service: Cardiovascular;  Laterality: N/A;   METATARSAL HEAD EXCISION Left 08/25/2019   Procedure: METATARSAL HEAD RESECTION OF THE 2ND AND 3RD METATARSAL LEFT FOOT;  Surgeon: Caprice Beaver, DPM;  [facility];  Service: Podiatry;  Laterality: Left;   POLYPECTOMY  11/03/2019   Procedure: POLYPECTOMY;  Surgeon: Rogene Houston, MD;  [facility];  Service: Endoscopy;;   TEE WITHOUT CARDIOVERSION N/A 06/12/2020   Procedure: TRANSESOPHAGEAL ECHOCARDIOGRAM (TEE);  Surgeon: Gaye Pollack, MD;  Location: [place];  Service: Open Heart Surgery;  Laterality: N/A;   WEIL OSTEOTOMY Left 07/09/2017   Procedure: WEIL OSTEOTOMY 2ND METATARSAL LEFT FOOT;  Surgeon: Caprice Beaver, DPM;  [facility];  Service: Podiatry;  Laterality:  Left;     Current Outpatient Medications:    amLODipine (NORVASC) 5 MG tablet, TAKE 1 TABLET BY MOUTH DAILY, Disp: 90 tablet, Rfl: 2   ascorbic acid (VITAMIN C) 1000 MG tablet, Take 1,000 mg by mouth daily. , Disp: , Rfl:    aspirin EC 81 MG tablet, Take 1 tablet (81 mg total) by mouth daily. Swallow whole., Disp: 90 tablet, Rfl: 3   benazepril (LOTENSIN) 40 MG tablet, Take 40 mg by mouth daily. , Disp: , Rfl: 4   Cholecalciferol (VITAMIN D3) 50 MCG (2000 UT) capsule, Take 2,000 Units by mouth every evening. , Disp: , Rfl:    fenofibrate (TRICOR) 145 MG tablet, Take 145 mg by mouth every  evening. , Disp: , Rfl:    fish oil-omega-3 fatty acids 1000 MG capsule, Take 1 g by mouth at bedtime. , Disp: , Rfl:    folic acid (FOLVITE) 016 MCG tablet, Take 400 mcg by mouth daily. , Disp: , Rfl:    Garlic 0109 MG CAPS, Take 1,000 mg by mouth at bedtime. , Disp: , Rfl:    Lysine 500 MG CAPS, Take 500 mg by mouth daily. , Disp: , Rfl:    metoprolol succinate (TOPROL-XL) 25 MG 24 hr tablet, Take 1 tablet (25 mg total) by mouth daily., Disp: 90 tablet, Rfl: 3   Multiple Vitamin (MULTIVITAMIN WITH MINERALS) TABS, Take 1 tablet by mouth daily., Disp: , Rfl:    Multiple Vitamins-Minerals (PRESERVISION AREDS 2+MULTI VIT PO), Take 1 capsule by mouth 2 (two) times daily., Disp: , Rfl:    rosuvastatin (CRESTOR) 40 MG tablet, Take 1 tablet (40 mg total) by mouth daily., Disp: 30 tablet, Rfl: 1   zinc gluconate 50 MG tablet, Take 50 mg by mouth every evening. , Disp: , Rfl:   Allergies  Allergen Reactions   Ciprofloxacin In D5w Rash and Other (See Comments)    Pruritis           Objective:  Physical Exam  General: AAO x3, NAD  Dermatological: Superficial granular wound noted along the left hallux along the medial IPJ from where the toe was rotated causing pressure.  There is no probing, undermining or tunneling.  There is no surrounding erythema, ascending cellulitis.  There is no fluctuation or crepitation but there is no malodor.  Vascular: Dorsalis Pedis artery and Posterior Tibial artery pedal pulses are 2/4 bilateral with immedate capillary fill time.  There is no pain with calf compression, swelling, warmth, erythema.   Neruologic: Sensation decreased with Semmes Weinstein monofilament.  Musculoskeletal: Significant hallux abductus is present resulting and varus deformity of the hallux causing pressure.  Muscular strength 5/5 in all groups tested bilateral.  Gait: Unassisted, Nonantalgic.       Assessment:   Healing wound due to digital deformity left hallux     Plan:   -Treatment options discussed including all alternatives, risks, and complications -Etiology of symptoms were discussed -X-rays were obtained and reviewed with the patient.  Status post second and third metatarsal head resections.  Significant bunions present.  No subacute fracture or osteomyelitis noted today. -No significant tissue to debride today of the wound.  Continue daily dressing changes and offloading.  Patient presents today to establish care and mostly to have surgery to straighten the toe out.  Discussed with him we can do surgery once the wound is healed there is no signs of infection.  He understands this.  *Prior to surgery order blood work and will need medical clearance  given his cardiac history.  Return in about 3 weeks (around 08/07/2021).  Trula Slade DPM

## 2021-08-02 ENCOUNTER — Ambulatory Visit: Payer: Medicare Other

## 2021-08-02 ENCOUNTER — Ambulatory Visit: Payer: Medicare Other | Admitting: Podiatry

## 2021-08-02 ENCOUNTER — Other Ambulatory Visit: Payer: Self-pay

## 2021-08-02 DIAGNOSIS — L97521 Non-pressure chronic ulcer of other part of left foot limited to breakdown of skin: Secondary | ICD-10-CM | POA: Diagnosis not present

## 2021-08-02 DIAGNOSIS — L02619 Cutaneous abscess of unspecified foot: Secondary | ICD-10-CM | POA: Diagnosis not present

## 2021-08-02 DIAGNOSIS — L03119 Cellulitis of unspecified part of limb: Secondary | ICD-10-CM | POA: Diagnosis not present

## 2021-08-02 MED ORDER — CEPHALEXIN 500 MG PO CAPS: 500.0000 mg | ORAL_CAPSULE | Freq: Three times a day (TID) | ORAL | 0 refills | Status: DC

## 2021-08-03 LAB — CBC WITH DIFFERENTIAL/PLATELET
Basophils Absolute: 0 10*3/uL (ref 0.0–0.2)
Basos: 0 %
EOS (ABSOLUTE): 0 10*3/uL (ref 0.0–0.4)
Eos: 1 %
Hematocrit: 41.1 % (ref 37.5–51.0)
Hemoglobin: 14.3 g/dL (ref 13.0–17.7)
Immature Grans (Abs): 0 10*3/uL (ref 0.0–0.1)
Immature Granulocytes: 0 %
Lymphocytes Absolute: 2.2 10*3/uL (ref 0.7–3.1)
Lymphs: 27 %
MCH: 32.8 pg (ref 26.6–33.0)
MCHC: 34.8 g/dL (ref 31.5–35.7)
MCV: 94 fL (ref 79–97)
Monocytes Absolute: 0.8 10*3/uL (ref 0.1–0.9)
Monocytes: 10 %
Neutrophils Absolute: 4.9 10*3/uL (ref 1.4–7.0)
Neutrophils: 62 %
Platelets: 173 10*3/uL (ref 150–450)
RBC: 4.36 x10E6/uL (ref 4.14–5.80)
RDW: 14.1 % (ref 11.6–15.4)
WBC: 8 10*3/uL (ref 3.4–10.8)

## 2021-08-03 LAB — BASIC METABOLIC PANEL
BUN/Creatinine Ratio: 13 (ref 10–24)
BUN: 18 mg/dL (ref 8–27)
CO2: 23 mmol/L (ref 20–29)
Calcium: 10.1 mg/dL (ref 8.6–10.2)
Chloride: 105 mmol/L (ref 96–106)
Creatinine, Ser: 1.35 mg/dL — ABNORMAL HIGH (ref 0.76–1.27)
Glucose: 83 mg/dL (ref 70–99)
Potassium: 5.4 mmol/L — ABNORMAL HIGH (ref 3.5–5.2)
Sodium: 143 mmol/L (ref 134–144)
eGFR: 52 mL/min/{1.73_m2} — ABNORMAL LOW (ref >59)

## 2021-08-03 LAB — C-REACTIVE PROTEIN: CRP: 4 mg/L (ref 0–10)

## 2021-08-03 LAB — SEDIMENTATION RATE: Sed Rate: 10 mm/hr (ref 0–30)

## 2021-08-04 NOTE — Progress Notes (Signed)
Subjective: 81 year old male presents the office today for follow evaluation of a wound on his left big toe.  He states that since last saw him he has been on his feet more working in the basement concrete surfaces and he has noticed a new spot forming the toe.  Denies any drainage or pus.  No increase in swelling or redness.  No fevers or chills that he reports.  He has no other concerns today.  Objective: AAO x3, NAD DP/PT pulses palpable bilaterally, CRT less than 3 seconds Significant bunions present resulting in pressure to the plantar aspect of the toe.  There is a ulceration noted with hyperkeratotic periwound.  Just adjacent to this area is what appears to be an old blister.  There is localized edema and erythema but there is no ascending cellulitis.  There is no probing to bone, amount of time.  There is no fluctuation or crepitation.  There is no malodor. No pain with calf compression, swelling, warmth, erythema       Assessment: Ulceration left hallux with localized cellulitis  Plan: -All treatment options discussed with the patient including all alternatives, risks, complications.  -Sharply debrided the wound utilizing a #312 with scalpel and complications down to healthy, bleeding, granular tissue.  There was minimal blood loss and hemostasis achieved through manual compression.  He tolerated the procedure well.  I cleansed the wound with saline.Silvadene was applied followed by dressing.  Continue with daily dressing changes.  Recommend offloading.  He was seen by our orthotist, Aaron Edelman for offloading with a surgical shoe and he applied an offloading device inside the shoe. -Keflex -Blood work ordered. -Monitor for any clinical signs or symptoms of infection and directed to call the office immediately should any occur or go to the ER. -Once the wound, infection is cleared we will likely need to proceed with first MPJ arthrodesis. -Patient encouraged to call the office with any  questions, concerns, change in symptoms.   *X-ray foot next appointment if wound still present.  Trula Slade DPM

## 2021-08-06 ENCOUNTER — Other Ambulatory Visit: Payer: Self-pay | Admitting: Podiatry

## 2021-08-06 DIAGNOSIS — E875 Hyperkalemia: Secondary | ICD-10-CM

## 2021-08-08 DIAGNOSIS — I25119 Atherosclerotic heart disease of native coronary artery with unspecified angina pectoris: Secondary | ICD-10-CM | POA: Diagnosis not present

## 2021-08-08 DIAGNOSIS — I10 Essential (primary) hypertension: Secondary | ICD-10-CM | POA: Diagnosis not present

## 2021-08-08 DIAGNOSIS — I4891 Unspecified atrial fibrillation: Secondary | ICD-10-CM | POA: Diagnosis not present

## 2021-08-08 DIAGNOSIS — Z79899 Other long term (current) drug therapy: Secondary | ICD-10-CM | POA: Diagnosis not present

## 2021-08-08 DIAGNOSIS — I700 Atherosclerosis of aorta: Secondary | ICD-10-CM | POA: Diagnosis not present

## 2021-08-08 DIAGNOSIS — Z299 Encounter for prophylactic measures, unspecified: Secondary | ICD-10-CM | POA: Diagnosis not present

## 2021-08-08 DIAGNOSIS — E7800 Pure hypercholesterolemia, unspecified: Secondary | ICD-10-CM | POA: Diagnosis not present

## 2021-08-09 ENCOUNTER — Ambulatory Visit: Payer: Medicare Other | Admitting: Podiatry

## 2021-08-17 ENCOUNTER — Other Ambulatory Visit: Payer: Self-pay

## 2021-08-17 ENCOUNTER — Ambulatory Visit: Payer: Medicare Other | Admitting: Podiatry

## 2021-08-17 DIAGNOSIS — L97521 Non-pressure chronic ulcer of other part of left foot limited to breakdown of skin: Secondary | ICD-10-CM

## 2021-08-17 DIAGNOSIS — M79674 Pain in right toe(s): Secondary | ICD-10-CM | POA: Diagnosis not present

## 2021-08-17 DIAGNOSIS — M21619 Bunion of unspecified foot: Secondary | ICD-10-CM | POA: Diagnosis not present

## 2021-08-17 DIAGNOSIS — B351 Tinea unguium: Secondary | ICD-10-CM | POA: Diagnosis not present

## 2021-08-17 DIAGNOSIS — M79675 Pain in left toe(s): Secondary | ICD-10-CM

## 2021-08-21 NOTE — Progress Notes (Signed)
Subjective: 81 year old male presents the office today for follow evaluation of a wound on his left big toe.  He states that since I last saw him he has been doing better.  The callus is formed.  Not seeing any significant drainage or pus.  No increase in swelling or redness.  Denies any fevers or chills.  Also asking for the nails be trimmed today as are thickened elongated he cannot do them himself.   He did have the potassium rechecked which is back to normal at 4.4.    Objective: AAO x3, NAD DP/PT pulses palpable bilaterally, CRT less than 3 seconds Significant bunions present resulting in pressure to the plantar aspect of the toe.  There is thick hyperkeratotic tissue present.  Upon debridement central granular wound is present but appears to be smaller.  There is no probing, amount or tunneling.  No surrounding erythema, ascending cellulitis.  No fluctuation or crepitation.  There is no malodor. Nails are hypertrophic, dystrophic, brittle, discolored, elongated 10. No surrounding redness or drainage. Tenderness nails 1-5 bilaterally.  No pain with calf compression, swelling, warmth, erythema          Assessment: Ulceration left hallux; symptomatic onychomycosis  Plan: -All treatment options discussed with the patient including all alternatives, risks, complications.  -Sharply debrided hyperkeratotic lesion as well as the wound today utilizing #312 with scalpel and complications down to healthy, granular tissue.  I was able debride nonviable devitalized tissue in order to promote wound healing.  Tolerated well with any complications.  Continue with daily dressing changes with Silvadene.  Continue offloading. -Sharply debrided the nails x10 without any complications or bleeding. -Discussed that in the future once the wound heals consider surgical intervention for first MPJ arthrodesis pending medical clearance.  *X-ray foot next appointment  Trula Slade DPM

## 2021-09-07 ENCOUNTER — Ambulatory Visit: Payer: Medicare Other | Admitting: Podiatry

## 2021-09-07 ENCOUNTER — Other Ambulatory Visit: Payer: Self-pay

## 2021-09-07 DIAGNOSIS — L97521 Non-pressure chronic ulcer of other part of left foot limited to breakdown of skin: Secondary | ICD-10-CM | POA: Diagnosis not present

## 2021-09-07 DIAGNOSIS — M21619 Bunion of unspecified foot: Secondary | ICD-10-CM | POA: Diagnosis not present

## 2021-09-07 NOTE — Patient Instructions (Signed)

## 2021-09-12 ENCOUNTER — Telehealth: Payer: Self-pay | Admitting: *Deleted

## 2021-09-12 ENCOUNTER — Telehealth: Payer: Self-pay

## 2021-09-12 ENCOUNTER — Encounter: Payer: Self-pay | Admitting: Podiatry

## 2021-09-12 NOTE — Telephone Encounter (Signed)
-----   Message from Satira Sark, MD sent at 09/12/2021  9:02 AM EDT ----- ?Regarding: RE: Clinical ? ?----- Message ----- ?From: Jonnie Finner ?Sent: 09/12/2021   8:52 AM EDT ?To: Satira Sark, MD ?Subject: Clinical                                      ? ? ?

## 2021-09-12 NOTE — Telephone Encounter (Signed)
Error

## 2021-09-12 NOTE — Progress Notes (Signed)
Subjective: ?81 year old male presents the office today for follow evaluation of a wound on his left big toe.  Also states a new spot of swelling at the tip of the left second toe.  He states a blister formed and he did clean the needle and he punctured this with the fluid out.  He has a wound to the tip of the toe.  He denies any drainage or pus coming from either toe.  Denies any fevers or chills.  No other concerns.  ? 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  Hyperkeratotic lesions noted along the left hallux, medial aspect.  There is hyperkeratotic tissue overlying the wound after debridement there is still a superficial granular wound present with any probing, undermining or tunneling.  No drainage or pus noted.  At the distal aspect of the second toe there is a superficial granular wound noted as well.  The nail itself is loose which is able to debride back.  There is currently no blister formation.  No drainage or pus or ascending cellulitis.  No fluctuation or crepitation. ?No pain with calf compression, swelling, warmth, erythema ? ?(Pictures did not save) ? ?Assessment: ?Ulceration left hallux, no ulceration second toe ? ?Plan: ?-All treatment options discussed with the patient including all alternatives, risks, complications.  ?-Sharply debrided both the wounds today utilizing #312 with scalpel any complications down to healthy, granular tissue.  No significant bleeding occurred.  Wounds were cleansed with saline and Silvadene was applied followed by dressing.  At this time would recommend washing with soap and water daily and apply antibiotic ointment followed by dressing daily. ?-Continue offloading ?-Also discussed with him surgical intervention given the deformity of the digits.  We will try to get cardiac clearance in preparation for surgery once the wounds of healed and  no signs of infection.  Unfortunately this can be an ongoing issue given the digital pharmacy with the ulcers. ?-Monitor for any clinical signs or symptoms of infection and directed to call the office immediately should any occur or go to the ER. ? ?X-ray next appointment ? ?No follow-ups on file. ? ?Trula Slade DPM ?

## 2021-09-12 NOTE — Telephone Encounter (Signed)
? ?  Pre-operative Risk Assessment  ?  ?Patient Name: Carl Young  ?DOB: [birth year] ?MRN: [id number] are  ? ?  ? ?Request for Surgical Clearance   ? ?Procedure:   First MPJ arthrodesis ? ?Date of Surgery:  Clearance TBD                              ?   ?Surgeon:  Celesta Gentile, DPM ?Surgeon's Group or Practice Name:  [facility]  ?Phone number:  [phone] ?Fax number:  [phone] ?  ?Type of Clearance Requested:   ?- Medical  ?  ?Type of Anesthesia:  General  ?  ?Additional requests/questions:   ?Please advise surgeon/provider what medications should be held. ?Are there any contraindications? ? ?Signed, ?Sung Amabile   ?09/12/2021, 10:51 AM   ?

## 2021-09-13 ENCOUNTER — Telehealth: Payer: Self-pay | Admitting: *Deleted

## 2021-09-13 NOTE — Telephone Encounter (Signed)
Hi Dr. Domenic Polite, ? ?Patient has an upcoming first MPJ arthrodesis planned. The clearance form does not specifically state that Aspirin needs to be held but sometimes this just does not get mentioned. He has a history of CAD s/p CABG in 05/2020. You last saw him in 03/2021 at which time he was doing well. Can you please comment on how long Aspirin can be held? ? ?Please route response back to P [facility] PREOP. ? ?Thank you! ?Eraina Winnie ?

## 2021-09-13 NOTE — Telephone Encounter (Addendum)
Pre-op covering staff, patient will need a telehealth visit to complete pre-op evaluation. Can you please call and consent patient for this and add to an APP pre-op schedule? ? ?Per Dr. Domenic Polite, [place] to hold Aspirin for 7 days prior to procedure if needed. ? ?Thank you! ?Darreld Mclean, PA-C ?09/13/2021 12:29 PM ? ? ?

## 2021-09-13 NOTE — Telephone Encounter (Signed)
?  Patient Consent for Virtual Visit  ? ? ?   ? ?Carl Young has provided verbal consent on 09/13/2021 for a virtual visit (video or telephone). ? ? ?CONSENT FOR VIRTUAL VISIT FOR:  Carl Young  ?By participating in this virtual visit I agree to the following: ? ?I hereby voluntarily request, consent and authorize [facility] and its employed or contracted physicians, physician assistants, nurse practitioners or other licensed health care professionals (the Practitioner), to provide me with telemedicine health care services (the ?Services") as deemed necessary by the treating Practitioner. I acknowledge and consent to receive the Services by the Practitioner via telemedicine. I understand that the telemedicine visit will involve communicating with the Practitioner through live audiovisual communication technology and the disclosure of certain medical information by electronic transmission. I acknowledge that I have been given the opportunity to request an in-person assessment or other available alternative prior to the telemedicine visit and am voluntarily participating in the telemedicine visit. ? ?I understand that I have the right to withhold or withdraw my consent to the use of telemedicine in the course of my care at any time, without affecting my right to future care or treatment, and that the Practitioner or I may terminate the telemedicine visit at any time. I understand that I have the right to inspect all information obtained and/or recorded in the course of the telemedicine visit and may receive copies of available information for a reasonable fee.  I understand that some of the potential risks of receiving the Services via telemedicine include:  ?Delay or interruption in medical evaluation due to technological equipment failure or disruption; ?Information transmitted may not be sufficient (e.g. poor resolution of images) to allow for appropriate medical decision making by the Practitioner;  and/or  ?In rare instances, security protocols could fail, causing a breach of personal health information. ? ?Furthermore, I acknowledge that it is my responsibility to provide information about my medical history, conditions and care that is complete and accurate to the best of my ability. I acknowledge that Practitioner's advice, recommendations, and/or decision may be based on factors not within their control, such as incomplete or inaccurate data provided by me or distortions of diagnostic images or specimens that may result from electronic transmissions. I understand that the practice of medicine is not an exact science and that Practitioner makes no warranties or guarantees regarding treatment outcomes. I acknowledge that a copy of this consent can be made available to me via my patient portal ([facility]), or I can request a printed copy by calling the office of [facility].   ? ?I understand that my insurance will be billed for this visit.  ? ?I have read or had this consent read to me. ?I understand the contents of this consent, which adequately explains the benefits and risks of the Services being provided via telemedicine.  ?I have been provided ample opportunity to ask questions regarding this consent and the Services and have had my questions answered to my satisfaction. ?I give my informed consent for the services to be provided through the use of telemedicine in my medical care ? ? ? ?

## 2021-09-13 NOTE — Telephone Encounter (Signed)
Pt agreeable to plan of care for tele pre op appt 09/14/21 @ 1 pm. Med rec and consent done ?

## 2021-09-14 ENCOUNTER — Other Ambulatory Visit: Payer: Self-pay

## 2021-09-14 ENCOUNTER — Ambulatory Visit (INDEPENDENT_AMBULATORY_CARE_PROVIDER_SITE_OTHER): Payer: Medicare Other | Admitting: General Practice

## 2021-09-14 DIAGNOSIS — Z01810 Encounter for preprocedural cardiovascular examination: Secondary | ICD-10-CM | POA: Diagnosis not present

## 2021-09-14 NOTE — Progress Notes (Signed)
? ?Virtual Visit via Telephone Note  ? ?This visit type was conducted due to national recommendations for restrictions regarding the COVID-19 Pandemic (e.g. social distancing) in an effort to limit this patient's exposure and mitigate transmission in our community.  Due to his co-morbid illnesses, this patient is at least at moderate risk for complications without adequate follow up.  This format is felt to be most appropriate for this patient at this time.  The patient did not have access to video technology/had technical difficulties with video requiring transitioning to audio format only (telephone).  All issues noted in this document were discussed and addressed.  No physical exam could be performed with this format.  Please refer to the patient's chart for his  consent to telehealth for [facility]. ?Evaluation Performed:  Preoperative cardiovascular risk assessment ? ?This visit type was conducted due to national recommendations for restrictions regarding the COVID-19 Pandemic (e.g. social distancing).  This format is felt to be most appropriate for this patient at this time.  All issues noted in this document were discussed and addressed.  No physical exam was performed (except for noted visual exam findings with Video Visits).  Please refer to the patient's chart (MyChart message for video visits and phone note for telephone visits) for the patient's consent to telehealth for [facility]. ?_____________  ? ?Date:  09/14/2021  ? ?Patient ID:  Carl Young 1941-01-01, MRN [id number] ?Patient Location:  ?Home ?Provider location:   ?Office ? ?Primary Care Provider:  Glenda Chroman, MD ?Primary Cardiologist:  Rozann Lesches, MD ? ?Chief Complaint  ?  ?81 y.o. y/o male with a h/o Bell's palsy, coronary artery disease, CKD stage III, essential hypertension, mixed hyperlipidemia, postoperative atrial fibrillation, who is pending first MPJ arthrodesis, and presents today for telephonic preoperative  cardiovascular risk assessment. ? ?Past Medical History  ?  ?Past Medical History:  ?Diagnosis Date  ? Bell's palsy   ? CAD (coronary artery disease)   ? 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 CKD (chronic kidney disease) stage 3, GFR 30-59 ml/min (HCC)   ? Essential hypertension   ? Helicobacter pylori (H. pylori) infection   ? History of GI bleed   ? Mixed hyperlipidemia   ? Nephrolithiasis   ? Postoperative atrial fibrillation (HCC)   ? ?Past Surgical History:  ?Procedure Laterality Date  ? APPENDECTOMY    ? COLONOSCOPY  05/06/2012  ? Procedure: COLONOSCOPY;  Surgeon: Rogene Houston, MD;  [facility];  Service: Endoscopy;  Laterality: N/A;  730  ? COLONOSCOPY N/A 11/03/2019  ? Procedure: COLONOSCOPY;  Surgeon: Rogene Houston, MD;  [facility];  Service: Endoscopy;  Laterality: N/A;  930-rescheduled 5/5 same time per office  ? CORONARY ARTERY BYPASS GRAFT N/A 06/12/2020  ? Procedure: CORONARY ARTERY BYPASS GRAFTING (CABG), ON PUMP, TIMES FIVE, USING LEFT INTERNAL MAMMARY ARTERY AND ENDOSCOPICALLY HARVESTED RIGHT GREATER SAPHENOUS VEIN;  Surgeon: Gaye Pollack, MD;  Location: [facility];  Service: Open Heart Surgery;  Laterality: N/A;  ? HAMMER TOE SURGERY Left 07/09/2017  ? Procedure: HAMMER TOE CORRECTION 2ND DIGIT LEFT FOOT;  Surgeon: Caprice Beaver, DPM;  Location: [facility];  Service: Podiatry;  Laterality: Left;  ? LEFT HEART CATH AND CORONARY ANGIOGRAPHY N/A 06/09/2020  ? Procedure: LEFT HEART CATH AND CORONARY ANGIOGRAPHY;  Surgeon: Troy Sine, MD;  Location: [facility];  Service: Cardiovascular;  Laterality: N/A;  ? METATARSAL HEAD EXCISION Left 08/25/2019  ? Procedure: METATARSAL HEAD RESECTION OF THE 2ND AND 3RD METATARSAL LEFT FOOT;  Surgeon: Caprice Beaver, DPM;  [facility];  Service: Podiatry;  Laterality: Left;  ? POLYPECTOMY  11/03/2019  ? Procedure: POLYPECTOMY;  Surgeon: Rogene Houston, MD;  [facility];  Service: Endoscopy;;  ? TEE WITHOUT CARDIOVERSION N/A 06/12/2020  ? Procedure: TRANSESOPHAGEAL ECHOCARDIOGRAM (TEE);  Surgeon: Gaye Pollack, MD;  Location: [facility];  Service: Open Heart Surgery;  Laterality: N/A;  ? WEIL OSTEOTOMY Left 07/09/2017  ? Procedure: WEIL OSTEOTOMY 2ND METATARSAL LEFT FOOT;  Surgeon: Caprice Beaver, DPM;  [facility];  Service: Podiatry;  Laterality: Left;  ? ? ?Allergies ? ?Allergies  ?Allergen Reactions  ? Ciprofloxacin In D5w Rash and Other (See Comments)  ?  Pruritis   ? ? ?History of Present Illness  ?  ?Carl Young is a 81 y.o. male who presents via audio/video conferencing for a telehealth visit today.  Pt was last seen in cardiology clinic on 04/10/2021, by Dr. Domenic Polite.  At that time Carl Young was doing well .  he is now pending first MPJ arthrodesis.  Since his last visit, he he continues to be stable from a cardiac standpoint. ? ?Today he denies chest pain, shortness of breath, lower extremity edema, fatigue, palpitations, melena, hematuria, hemoptysis, diaphoresis, weakness, presyncope, syncope, orthopnea, and PND. ? ? ? ?Home Medications  ?  ?Prior to Admission medications   ?Medication Sig Start Date End Date Taking? Authorizing Provider  ?amLODipine (NORVASC) 5 MG tablet TAKE 1 TABLET BY MOUTH DAILY 02/05/21   Satira Sark, MD  ?ascorbic acid (VITAMIN C) 1000 MG tablet Take 1,000 mg by mouth daily.     [provider]  ?aspirin EC 81 MG tablet Take 1 tablet (81 mg total) by mouth daily. Swallow whole. 12/14/20   Satira Sark, MD  ?benazepril (LOTENSIN) 40 MG tablet Take 40 mg by mouth daily.  06/05/17   [provider]  ?cephALEXin (KEFLEX) 500 MG capsule Take 1 capsule (500 mg total) by mouth 3 (three) times daily. ?Patient not taking: Reported on 09/13/2021 08/02/21   Trula Slade, DPM  ?Cholecalciferol (VITAMIN D3) 50 MCG (2000 UT) capsule Take 2,000 Units by mouth every evening.      [provider]  ?fenofibrate (TRICOR) 145 MG tablet Take 145 mg by mouth every evening.     [provider]  ?fish oil-omega-3 fatty acids 1000 MG capsule Take 1 g by mouth at bedtime.     [provider]  ?folic acid (FOLVITE) 397 MCG tablet Take 400 mcg by mouth daily.     [provider]  ?Garlic 6734 MG CAPS Take 1,000 mg by mouth at bedtime.     [provider]  ?Lysine 500 MG CAPS Take 500 mg by mouth daily.     [provider]  ?metoprolol succinate (TOPROL-XL) 25 MG 24 hr tablet Take 1 tablet (25 mg total) by mouth daily. 04/10/21   Satira Sark, MD  ?Multiple Vitamin (MULTIVITAMIN WITH MINERALS) TABS Take 1 tablet by mouth daily.    [provider]  ?Multiple Vitamins-Minerals (PRESERVISION AREDS 2+MULTI VIT PO) Take 1 capsule by mouth 2 (two) times daily.    [provider]  ?rosuvastatin (CRESTOR) 40 MG tablet Take 1 tablet (40 mg total) by mouth daily. 06/18/20   John Giovanni, PA-C  ?  zinc gluconate 50 MG tablet Take 50 mg by mouth every evening.     [provider]  ? ? ?Physical Exam  ?  ?Vital Signs:  Camille R Givhan does not have vital signs available for review today. ? ?Given telephonic nature of communication, physical exam is limited. ?AAOx3. NAD. Normal affect.  Speech and respirations are unlabored. ? ?Accessory Clinical Findings  ?  ?None ? ?Assessment & Plan  ?  ?1.  Preoperative Cardiovascular Risk Assessment: ? ?  ? ?Primary Cardiologist: Rozann Lesches, MD ? ?Chart reviewed as part of pre-operative protocol coverage. Given past medical history and time since last visit, based on ACC/AHA guidelines, Deshawn R Capito would be at acceptable risk for the planned procedure without further cardiovascular testing.  ? ?His RCRI is a class III risk, 6.6% risk of major cardiac event.  He is able to complete greater than 4 METS of physical activity. ? ?His aspirin may be held for 7 days prior to his procedure.   Please resume as soon as hemostasis is achieved. ? ?Patient was advised that if he develops new symptoms prior to surgery to contact our office to arrange a follow-up appointment.  He verbalized understanding.

## 2021-09-21 ENCOUNTER — Ambulatory Visit: Payer: Medicare Other | Admitting: Podiatry

## 2021-09-21 ENCOUNTER — Ambulatory Visit: Payer: Medicare Other

## 2021-09-21 ENCOUNTER — Other Ambulatory Visit: Payer: Self-pay

## 2021-09-21 DIAGNOSIS — M21619 Bunion of unspecified foot: Secondary | ICD-10-CM | POA: Diagnosis not present

## 2021-09-21 DIAGNOSIS — L97521 Non-pressure chronic ulcer of other part of left foot limited to breakdown of skin: Secondary | ICD-10-CM

## 2021-09-24 NOTE — Progress Notes (Signed)
Subjective: ?81 year old male presents the office today for follow evaluation of a wound on his left big toe and second toe.  He states he has been doing well has not seen any drainage or any new lesions.  Continue Betadine on both the areas.  No swelling or redness that he reports.  Denies any fevers or chills.  Has no other concerns. ? 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  Hyperkeratotic lesion noted along the plantar medial aspect of the hallux and upon debridement only small superficial granular wound is still present with any probing, no tunneling.  There was there was present the distal aspect of the second toe is also much improved and almost healed.  Still small granular superficial wound present.  There is no probing, undermining or tunneling.  There is no edema, erythema to either the lesions.  No fluctuation or crepitation. ?No pain with calf compression, swelling, warmth, erythema ? ?Assessment: ?Ulceration left hallux, ulceration second toe ? ?Plan: ?-All treatment options discussed with the patient including all alternatives, risks, complications.  ?-Sharply debrided both the wounds today utilizing #312 with scalpel any complications down to healthy, granular tissue.  No significant bleeding occurred.  Wounds were cleansed with saline and Silvadene was applied followed by dressing.  At this time would recommend washing with soap and water daily and apply antibiotic ointment followed by dressing daily. ?-Continue offloading. ?-Discussed surgical intervention.  We will very likely plan on doing this in April for first MTPJ arthrodesis. ?-Monitor for any clinical signs or symptoms of infection and directed to call the office immediately should any occur or go to the ER. ? ?No follow-ups on file.  X-ray next appointment ? ?Trula Slade DPM ?

## 2021-10-12 ENCOUNTER — Ambulatory Visit (INDEPENDENT_AMBULATORY_CARE_PROVIDER_SITE_OTHER): Payer: Medicare Other

## 2021-10-12 ENCOUNTER — Ambulatory Visit: Payer: Medicare Other | Admitting: Podiatry

## 2021-10-12 DIAGNOSIS — L97521 Non-pressure chronic ulcer of other part of left foot limited to breakdown of skin: Secondary | ICD-10-CM | POA: Diagnosis not present

## 2021-10-12 DIAGNOSIS — M21619 Bunion of unspecified foot: Secondary | ICD-10-CM

## 2021-10-15 NOTE — Progress Notes (Signed)
Subjective: ?81 year old male presents the office today for follow evaluation of a wound on his left big toe and second toe.  He states he is doing better.  He has not injury or pus.  This is one of them history denies any fevers or chills.  No other concerns today. ? ?Objective: ?AAO x3, NAD ?DP/PT pulses palpable bilaterally, CRT less than 3 seconds ?Significant bunions present resulting in pressure to the plantar aspect of hallux.  Upon debridement still small professional small granular wound present without any probing, no tunneling.  No surrounding erythema, ascending cellulitis.  No fluctuation or crepitation.  No odor.  No fluctuation or crepitation. ?Left second toe is healed. ?No pain with calf compression, swelling, warmth, erythema ? ? ?Assessment: ?Ulceration left hallux, ulceration second toe ? ?Plan: ?-All treatment options discussed with the patient including all alternatives, risks, complications.  ?-X-rays obtained reviewed.  3 views of the left foot were obtained.  No evidence of acute fracture or acute osteomyelitis. ?-Sharply debrided both the wounds today utilizing #312 with scalpel any complications down to healthy, granular tissue.  No significant bleeding occurred.  Wounds were cleansed with saline and Silvadene was applied followed by dressing.  At this time would recommend washing with soap and water daily and apply antibiotic ointment followed by dressing daily. ?-Continue offloading.  We will plan on doing this May 3.  I will see him prior to this to make sure there is no infection.  Ultimately would like to see the wound healed if possible prior to surgery.  ?-Monitor for any clinical signs or symptoms of infection and directed to call the office immediately should any occur or go to the ER. ? ?No follow-ups on file.  X-ray next appointment ? ?Trula Slade DPM ?

## 2021-10-23 ENCOUNTER — Ambulatory Visit (INDEPENDENT_AMBULATORY_CARE_PROVIDER_SITE_OTHER): Payer: Medicare Other

## 2021-10-23 ENCOUNTER — Ambulatory Visit: Payer: Medicare Other | Admitting: Podiatry

## 2021-10-23 DIAGNOSIS — M21619 Bunion of unspecified foot: Secondary | ICD-10-CM

## 2021-10-23 DIAGNOSIS — L97521 Non-pressure chronic ulcer of other part of left foot limited to breakdown of skin: Secondary | ICD-10-CM

## 2021-10-23 MED ORDER — ONDANSETRON HCL 4 MG PO TABS: 4.0000 mg | ORAL_TABLET | Freq: Three times a day (TID) | ORAL | 0 refills | Status: DC | PRN

## 2021-10-23 MED ORDER — HYDROCODONE-ACETAMINOPHEN 5-325 MG PO TABS: 1.0000 | ORAL_TABLET | Freq: Four times a day (QID) | ORAL | 0 refills | Status: DC | PRN

## 2021-10-23 MED ORDER — CEPHALEXIN 500 MG PO CAPS: 500.0000 mg | ORAL_CAPSULE | Freq: Three times a day (TID) | ORAL | 0 refills | Status: DC

## 2021-10-23 NOTE — Patient Instructions (Signed)
Do not start any of the medications until after surgery ? ?Pre-Operative Instructions ? ?Congratulations, you have decided to take an important step to improving your quality of life.  You can be assured that the doctors of [facility] will be with you every step of the way. ? ?Plan to be at the surgery center/hospital at least 1 (one) hour prior to your scheduled time unless otherwise directed by the surgical center/hospital staff.  You must have a responsible adult accompany you, remain during the surgery and drive you home.  Make sure you have directions to the surgical center/hospital and know how to get there on time. ?For hospital based surgery you will need to obtain a history and physical form from your family physician within 1 month prior to the date of surgery- we will give you a form for you primary physician.  ?We make every effort to accommodate the date you request for surgery.  There are however, times where surgery dates or times have to be moved.  We will contact you as soon as possible if a change in schedule is required.   ?No Aspirin/Ibuprofen for one week before surgery.  If you are on aspirin, any non-steroidal anti-inflammatory medications (Mobic, Aleve, Ibuprofen) you should stop taking it 7 days prior to your surgery.  You make take Tylenol  For pain prior to surgery.  ?Medications- If you are taking daily heart and blood pressure medications, seizure, reflux, allergy, asthma, anxiety, pain or diabetes medications, make sure the surgery center/hospital is aware before the day of surgery so they may notify you which medications to take or avoid the day of surgery. ?No food or drink after midnight the night before surgery unless directed otherwise by surgical center/hospital staff. ?No alcoholic beverages 24 hours prior to surgery.  No smoking 24 hours prior to or 24 hours after surgery. ?Wear loose pants or shorts- loose enough to fit over bandages, boots, and casts. ?No slip on shoes,  sneakers are best. ?Bring your boot with you to the surgery center/hospital.  Also bring crutches or a walker if your physician has prescribed it for you.  If you do not have this equipment, it will be provided for you after surgery. ?If you have not been contracted by the surgery center/hospital by the day before your surgery, call to confirm the date and time of your surgery. ?Leave-time from work may vary depending on the type of surgery you have.  Appropriate arrangements should be made prior to surgery with your employer. ?Prescriptions will be provided immediately following surgery by your doctor.  Have these filled as soon as possible after surgery and take the medication as directed. ?Remove nail polish on the operative foot. ?Wash the night before surgery.  The night before surgery wash the foot and leg well with the antibacterial soap provided and water paying special attention to beneath the toenails and in between the toes.  Rinse thoroughly with water and dry well with a towel.  Perform this wash unless told not to do so by your physician. ? ?Enclosed: 1 Ice pack (please put in freezer the night before surgery) ?  1 Hibiclens skin cleaner ?  Pre-op Instructions ? ?If you have any questions regarding the instructions, do not hesitate to call our office at any point during this process.  ? ?: [place], [place] [address] [phone] ? ?[place]: [address], [place], [place] [address] [phone] ? ?Dr. Celesta Gentile, DPM ? ?

## 2021-10-24 ENCOUNTER — Telehealth: Payer: Self-pay

## 2021-10-24 NOTE — Telephone Encounter (Signed)
DOS 10/31/2021 ? ?HALLUX MPJ FUSION 1ST LT - 07867 ? ?[employer]HC MEDICARE EFFECTIVE DATE - 07/01/2021 ? ?PLAN DEDUCTIBLE - $0.00 ?OUT OF POCKET - $3900.00 W/ $3705.00 REMAINING ? ?CO-INSURANCE ?0% / Day OUTPATIENT SURGERY ?0% / [place] ?COPAY ?$295 / Day OUTPATIENT SURGERY ?$68 / [place] ? ? ?Notification or Prior Authorization is not required for the requested services ? ?This [employer] Medicare Advantage members plan does not currently require a prior authorization for these services. If you have general questions about the prior authorization requirements, please call us at [phone] or visit [email or web address] > Clinician Resources > Advance and Admission Notification Requirements. The number above acknowledges your notification. Please write this number down for future reference. Notification is not a guarantee of coverage or payment. ? ?Decision ID #:[id number] ?

## 2021-10-26 ENCOUNTER — Other Ambulatory Visit: Payer: Self-pay

## 2021-10-26 DIAGNOSIS — L97521 Non-pressure chronic ulcer of other part of left foot limited to breakdown of skin: Secondary | ICD-10-CM

## 2021-10-30 NOTE — Progress Notes (Signed)
Subjective: ?81 year old male presents the office today for follow evaluation of a wound on his left big toe and second toe.  He states he is doing better.  He is scheduled for surgery next week for first MPJ arthrodesis and he presents today for evaluation of the wound, infection prior to surgery.  Denies any drainage or pus.  No fevers or chills.  Has no other concerns today. ? ?Objective: ?AAO x3, NAD ?DP/PT pulses palpable bilaterally, CRT less than 3 seconds ?Significant bunions present resulting in pressure to the plantar aspect of hallux.  Superficial granular skin fissure type lesion is present without any probing, undermining or tunneling.  There is no surrounding erythema, ascending cellulitis.  There is no fluctuation or crepitation.  There is no malodor.  Ulceration of the second toe is resolved.  No other ulcerations are noted today.  No signs of infection. ?No pain with calf compression, swelling, warmth, erythema ? ? ?Assessment: ?Ulceration left hallux, ulceration second toe ? ?Plan: ?-All treatment options discussed with the patient including all alternatives, risks, complications.  ?-X-rays obtained reviewed.  3 views of the left foot were obtained.  No evidence of acute fracture or acute osteomyelitis.  Severe bunion is present with previous metatarsal head resections of the second and third metatarsals.  Digital deformities are noted. ?-Sharply debrided the hyperkeratotic lesion, also with a #3 telescope without any complications and healthy, granular tissue.  No signs of infection today. ?-I again discussed the surgery as well as postoperative course.  At this point given the nature of the hallux and the valgus deformity he is continue to have recurrence of the wound, callus to the area.  I think it can be difficult to get the wound to completely heal until the toe is no longer in the varus deformity.  There is currently no signs of infection.  We will plan for surgery next week for first MPJ  arthrodesis but the meantime if anything were to change or any signs of infection prior to surgery we will have to reschedule and he understands this.  We discussed the risk of amputation. ?-The incision placement as well as the postoperative course was discussed with the patient. I discussed risks of the surgery which include, but not limited to, infection, bleeding, pain, swelling, need for further surgery, delayed or nonhealing, painful or ugly scar, numbness or sensation changes, over/under correction, recurrence, transfer lesions, further deformity, hardware failure, DVT/PE, loss of toe/foot. Patient understands these risks and wishes to proceed with surgery. The surgical consent was reviewed with the patient all 3 pages were signed. No promises or guarantees were given to the outcome of the procedure. All questions were answered to the best of my ability. Before the surgery the patient was encouraged to call the office if there is any further questions. The surgery will be performed at the [facility] on an outpatient basis. ? ?Trula Slade DPM ? ?

## 2021-10-31 ENCOUNTER — Encounter: Payer: Self-pay | Admitting: Podiatry

## 2021-10-31 DIAGNOSIS — M25572 Pain in left ankle and joints of left foot: Secondary | ICD-10-CM | POA: Diagnosis not present

## 2021-10-31 DIAGNOSIS — M21612 Bunion of left foot: Secondary | ICD-10-CM | POA: Diagnosis not present

## 2021-10-31 DIAGNOSIS — L97521 Non-pressure chronic ulcer of other part of left foot limited to breakdown of skin: Secondary | ICD-10-CM | POA: Diagnosis not present

## 2021-10-31 DIAGNOSIS — M19072 Primary osteoarthritis, left ankle and foot: Secondary | ICD-10-CM | POA: Diagnosis not present

## 2021-11-01 ENCOUNTER — Other Ambulatory Visit: Payer: Self-pay | Admitting: Cardiology

## 2021-11-05 ENCOUNTER — Ambulatory Visit (INDEPENDENT_AMBULATORY_CARE_PROVIDER_SITE_OTHER): Payer: Medicare Other | Admitting: Podiatry

## 2021-11-05 ENCOUNTER — Ambulatory Visit (INDEPENDENT_AMBULATORY_CARE_PROVIDER_SITE_OTHER): Payer: Medicare Other

## 2021-11-05 DIAGNOSIS — Z9889 Other specified postprocedural states: Secondary | ICD-10-CM | POA: Diagnosis not present

## 2021-11-05 DIAGNOSIS — M21612 Bunion of left foot: Secondary | ICD-10-CM

## 2021-11-07 NOTE — Progress Notes (Signed)
Subjective: ?Carl Young is a 81 y.o. is seen today in office s/p left foot first MPJ arthrodesis preformed on 10/31/2021.  States he has been doing well with the nerve block wore off he has not had any pain.  He is interested at the foot is much as possible.  And icing elevating as well.  Denies any fevers or chills.  No chest pain shortness of breath.  No other concerns.  ? ?Objective: ?General: No acute distress, AAOx3  ?DP/PT pulses palpable 2/4, CRT < 3 sec to all digits.  ?Protective sensation intact. Motor function intact.  ?Left foot: Incision is well coapted without any evidence of dehiscence with sutures intact. There is no surrounding erythema, ascending cellulitis, fluctuance, crepitus, malodor, drainage/purulence. There is mild to moderate edema around the surgical site. There is no significant pain along the surgical site.  Toes in rectus position.  The area of the wound on the plantar aspect of the hallux is actually already doing better.  Appears to be always healed.  There is no drainage or pus or signs of infection. ?No other areas of tenderness to bilateral lower extremities.  ?No other open lesions or pre-ulcerative lesions.  ?No pain with calf compression, swelling, warmth, erythema.  ? ?Assessment and Plan:  ?Status post left first MPJ arthrodesis, doing well with no complications  ? ?-Treatment options discussed including all alternatives, risks, and complications ?-X-rays were obtained reviewed.  3 views of the left foot were obtained.  No evidence of acute fracture.  Status post first MPJ arthrodesis without any complicating factors. ?-Incision was cleaned.  Antibiotic ointment and a bandage applied.  Keep dressing clean, dry, intact. ?-Remain nonweightbearing in the cam boot.  If needed weightbearing to the heel for short distances, transfer. ?-Ice/elevation ?-Pain medication as needed. ?-Monitor for any clinical signs or symptoms of infection and DVT/PE and directed to call the office  immediately should any occur or go to the ER. ?-Follow-up as scheduled for possible suture removal and wound check or sooner if any problems arise. In the meantime, encouraged to call the office with any questions, concerns, change in symptoms.  ? ?Celesta Gentile, DPM ? ?

## 2021-11-15 ENCOUNTER — Ambulatory Visit (INDEPENDENT_AMBULATORY_CARE_PROVIDER_SITE_OTHER): Payer: Medicare Other | Admitting: Podiatry

## 2021-11-15 DIAGNOSIS — Z9889 Other specified postprocedural states: Secondary | ICD-10-CM

## 2021-11-15 DIAGNOSIS — M21612 Bunion of left foot: Secondary | ICD-10-CM

## 2021-11-18 NOTE — Progress Notes (Signed)
Subjective: Carl Young is a 81 y.o. is seen today in office s/p left foot first MPJ arthrodesis preformed on 10/31/2021.  He presents today for suture movable.  He states he has been doing well he is not having any pain.  He uses a walker in the house with minimal weightbearing in the cam boot.  No recent injury or falls.  No other concerns.  No fevers or chills.  No chest pain or shortness of breath.    Objective: General: No acute distress, AAOx3  DP/PT pulses palpable 2/4, CRT < 3 sec to all digits.  Protective sensation intact. Motor function intact.  Left foot: Incision is well coapted without any evidence of dehiscence with sutures intact.  Minimal edema.  There is no erythema or warmth.  No drainage or pus.  No erythema or ascending cellulitis.  No signs of infection.  Superficial granular wound noted on the plantar aspect of the hallux however continues to be healing and there is no signs of infection.  No edema, erythema.  No probing, amount or tunneling. No other open lesions or pre-ulcerative lesions.  No pain with calf compression, swelling, warmth, erythema.   Assessment and Plan:  Status post left first MPJ arthrodesis, doing well with no complications   -Treatment options discussed including all alternatives, risks, and complications -Sutures removed today without complications incisions well coapted.  Steri-Strips applied for reinforcement followed by antibiotic ointment and a bandage.  He can wash the foot with soap and water and apply a similar bandage. -Remain nonweightbearing in the cam boot.  If needed weightbearing to the heel for short distances, transfer. -Ice/elevation -Pain medication as needed. -Monitor for any clinical signs or symptoms of infection and DVT/PE and directed to call the office immediately should any occur or go to the ER. -Follow-up as scheduled for possible suture removal and wound check or sooner if any problems arise. In the meantime, encouraged to  call the office with any questions, concerns, change in symptoms.   Celesta Gentile, DPM

## 2021-11-29 ENCOUNTER — Ambulatory Visit (INDEPENDENT_AMBULATORY_CARE_PROVIDER_SITE_OTHER): Payer: Medicare Other | Admitting: Podiatry

## 2021-11-29 ENCOUNTER — Ambulatory Visit (INDEPENDENT_AMBULATORY_CARE_PROVIDER_SITE_OTHER): Payer: Medicare Other

## 2021-11-29 DIAGNOSIS — M79674 Pain in right toe(s): Secondary | ICD-10-CM

## 2021-11-29 DIAGNOSIS — B351 Tinea unguium: Secondary | ICD-10-CM

## 2021-11-29 DIAGNOSIS — M21612 Bunion of left foot: Secondary | ICD-10-CM

## 2021-11-29 DIAGNOSIS — Z9889 Other specified postprocedural states: Secondary | ICD-10-CM | POA: Diagnosis not present

## 2021-11-29 DIAGNOSIS — M79675 Pain in left toe(s): Secondary | ICD-10-CM | POA: Diagnosis not present

## 2021-12-01 NOTE — Progress Notes (Signed)
Subjective: Carl Young is a 81 y.o. is seen today in office s/p left foot first MPJ arthrodesis preformed on 10/31/2021.  States he has been doing well.  He has been staying off the foot is much as possible but he does put some weight on his foot in the cam boot.  No recent injury or changes.  Denies any fevers or chills.  Asking for the nails to be trimmed as are thickened elongated and he has difficulty trimming them himself.  Has no other concerns.  Objective: General: No acute distress, AAOx3  DP/PT pulses palpable 2/4, CRT < 3 sec to all digits.  Protective sensation intact. Motor function intact.  Left foot: Incision is well coapted without any evidence of dehiscence and scar is forming.  There is trace edema.  No erythema or warmth.  Arthrodesis site appears to be stable.  There is no tenderness palpation.  The area of the previous ulceration on the plantar aspect the hallux is almost completely resolved at this time Small pinpoint superficial granular wound is present.  There is no probing, undermining or tunneling.  No surrounding erythema, ascending cellulitis.  No fluctuance or crepitation for this malodor.  Nails are hypertrophic, dystrophic, brittle, discolored, elongated 10. No surrounding redness or drainage. Tenderness nails 1-5 bilaterally. No open lesions or pre-ulcerative lesions are identified today. No pain with calf compression, swelling, warmth, erythema.   Assessment and Plan:  Status post left first MPJ arthrodesis, doing well with no complications; symptomatic onychomycosis  -Treatment options discussed including all alternatives, risks, and complications -X-rays obtained reviewed of the left foot.  3 views were obtained.  Evidence of acute fracture.  Status post first imaging arthrodesis with hardware intact without any complicating factors. -Surgery appears to be healing well.  Discussed with conservative transition to partial weightbearing in cam boot and using a  walker. -Continue to ice and elevate -Sharply to the area of the hyperkeratotic lesion, wound without any complications or bleeding.  The area the ulceration previously is almost completely resolved. -Ice/elevation -Pain medication as needed. -Nails sharply debrided x10 without any complications or bleeding. -Monitor for any clinical signs or symptoms of infection and DVT/PE and directed to call the office immediately should any occur or go to the ER. -Follow-up as scheduled for possible suture removal and wound check or sooner if any problems arise. In the meantime, encouraged to call the office with any questions, concerns, change in symptoms.   Celesta Gentile, DPM

## 2021-12-14 DIAGNOSIS — Z789 Other specified health status: Secondary | ICD-10-CM | POA: Diagnosis not present

## 2021-12-14 DIAGNOSIS — I10 Essential (primary) hypertension: Secondary | ICD-10-CM | POA: Diagnosis not present

## 2021-12-14 DIAGNOSIS — Z7189 Other specified counseling: Secondary | ICD-10-CM | POA: Diagnosis not present

## 2021-12-14 DIAGNOSIS — Z299 Encounter for prophylactic measures, unspecified: Secondary | ICD-10-CM | POA: Diagnosis not present

## 2021-12-14 DIAGNOSIS — Z0000 Encounter for general adult medical examination without abnormal findings: Secondary | ICD-10-CM | POA: Diagnosis not present

## 2021-12-17 ENCOUNTER — Ambulatory Visit (INDEPENDENT_AMBULATORY_CARE_PROVIDER_SITE_OTHER): Payer: Medicare Other | Admitting: Podiatry

## 2021-12-17 ENCOUNTER — Ambulatory Visit (INDEPENDENT_AMBULATORY_CARE_PROVIDER_SITE_OTHER): Payer: Medicare Other

## 2021-12-17 DIAGNOSIS — M216X1 Other acquired deformities of right foot: Secondary | ICD-10-CM

## 2021-12-17 DIAGNOSIS — M21612 Bunion of left foot: Secondary | ICD-10-CM

## 2021-12-17 DIAGNOSIS — Z9889 Other specified postprocedural states: Secondary | ICD-10-CM

## 2021-12-27 DIAGNOSIS — Z85828 Personal history of other malignant neoplasm of skin: Secondary | ICD-10-CM | POA: Diagnosis not present

## 2021-12-27 DIAGNOSIS — L820 Inflamed seborrheic keratosis: Secondary | ICD-10-CM | POA: Diagnosis not present

## 2021-12-27 DIAGNOSIS — L570 Actinic keratosis: Secondary | ICD-10-CM | POA: Diagnosis not present

## 2021-12-27 DIAGNOSIS — Z08 Encounter for follow-up examination after completed treatment for malignant neoplasm: Secondary | ICD-10-CM | POA: Diagnosis not present

## 2021-12-27 DIAGNOSIS — X32XXXD Exposure to sunlight, subsequent encounter: Secondary | ICD-10-CM | POA: Diagnosis not present

## 2022-01-17 ENCOUNTER — Ambulatory Visit (INDEPENDENT_AMBULATORY_CARE_PROVIDER_SITE_OTHER): Payer: Medicare Other

## 2022-01-17 ENCOUNTER — Ambulatory Visit (INDEPENDENT_AMBULATORY_CARE_PROVIDER_SITE_OTHER): Payer: Medicare Other | Admitting: Podiatry

## 2022-01-17 DIAGNOSIS — Z9889 Other specified postprocedural states: Secondary | ICD-10-CM

## 2022-01-17 DIAGNOSIS — M216X1 Other acquired deformities of right foot: Secondary | ICD-10-CM

## 2022-01-17 DIAGNOSIS — M21612 Bunion of left foot: Secondary | ICD-10-CM

## 2022-01-17 NOTE — Progress Notes (Signed)
Subjective: Chief Complaint  Patient presents with   Routine Post Op    POV #5 DOS 10/31/2021 LT 1ST MTPJ FUSION No N/V/F/C/SOB, PT denies any pain, pain states he has just soreness, X-Rays were taken today    Patient presents to the office today for follow up with above complaint. He states he is doing well. Still come soreness on the on the bottom of the left foot and also the callus area area.  Presents today wearing regular shoe but using a cane.  Denies any fevers or chills.  No other concerns.   Objective: General: No acute distress, AAOx3 - wearing regular shoes and using a cane DP/PT pulses palpable 2/4, CRT < 3 sec to all digits.  On the left foot incision is well coapted with scarring present.  There is minimal edema.  No erythema or warmth.  The hallux MPJ is rectus there is abductus noted of the hallux IPJ was not causing any irritation.  Previous wound is resolved. On the right foot there is minimal hyperkeratotic tissue submetatarsal area that any underlying ulceration drainage or signs of infection.  Prominent metatarsal heads. No pain with calf compression, swelling, warmth, erythema.   Assessment and Plan:  Status post left first MPJ arthrodesis, doing well with no complications  -Treatment options discussed including all alternatives, risks, and complications -X-rays were obtained and reviewed with the patient.  3 views of the left foot were obtained.  No evidence of acute fracture.  Hardware intact with any complicating factors. -Meaured for new  orthotics today.  We will offload the metatarsal heads. -Lightly debrided the callus on the right foot with any complications wound today as a courtesy.  There is very minimal callus formation. -Continue with shoe for now as well as with cane.  Discussed otherwise I would not increase activity much more at this time.  Continue ice, elevate as well as compression wrapping postoperative edema.  Trula Slade DPM

## 2022-01-27 ENCOUNTER — Other Ambulatory Visit: Payer: Self-pay | Admitting: Cardiology

## 2022-02-28 ENCOUNTER — Ambulatory Visit (INDEPENDENT_AMBULATORY_CARE_PROVIDER_SITE_OTHER): Payer: Medicare Other

## 2022-02-28 ENCOUNTER — Ambulatory Visit (INDEPENDENT_AMBULATORY_CARE_PROVIDER_SITE_OTHER): Payer: Medicare Other | Admitting: Podiatry

## 2022-02-28 DIAGNOSIS — M216X1 Other acquired deformities of right foot: Secondary | ICD-10-CM

## 2022-02-28 DIAGNOSIS — Z9889 Other specified postprocedural states: Secondary | ICD-10-CM

## 2022-02-28 DIAGNOSIS — M21612 Bunion of left foot: Secondary | ICD-10-CM

## 2022-02-28 DIAGNOSIS — M216X2 Other acquired deformities of left foot: Secondary | ICD-10-CM

## 2022-02-28 DIAGNOSIS — M79675 Pain in left toe(s): Secondary | ICD-10-CM

## 2022-02-28 DIAGNOSIS — B351 Tinea unguium: Secondary | ICD-10-CM

## 2022-02-28 DIAGNOSIS — M79674 Pain in right toe(s): Secondary | ICD-10-CM | POA: Diagnosis not present

## 2022-02-28 NOTE — Progress Notes (Signed)
Patient presents today to pick up custom molded foot orthotics, diagnosed with prominent metatarsal head by Dr. Jacqualyn Posey.   Orthotics were dispensed and fit was satisfactory. Reviewed instructions for break-in and wear. Written instructions given to patient.  Patient will follow up as needed.   [facility] - order # [id number]

## 2022-03-04 NOTE — Progress Notes (Signed)
Subjective: Chief Complaint  Patient presents with   Routine Post Op    POV #6 DOS 10/31/2021 LT 1ST MTPJ FUSION  No pain but has some soreness.     81 year old male presents to the office today for follow up with above complaint.  States no surgical standpoint is doing well.  Presents today to pick up orthotics.  Also his nails are thickened elongated has difficulty trimming them himself he also gets calluses to the balls of his feet which has been a chronic issue.  No open lesions that he reports.  No splinter redness or drainage.     Objective: General: No acute distress, AAOx3 - wearing regular shoes and using a cane DP/PT pulses palpable 2/4, CRT < 3 sec to all digits.  On the left foot incision is well coapted with scarring present.  There is no pain at the surgical site and the arthrodesis appears to be stable.  No significant is no erythema. Nails are hypertrophic, dystrophic, brittle, discolored, elongated 10. No surrounding redness or drainage. Tenderness nails 1-5 bilaterally. No open lesions or pre-ulcerative lesions are identified today. Hyperkeratotic lesion submetatarsal bilaterally.  No underlying ulceration drainage or signs of infection. Prominent metatarsal heads. No pain with calf compression, swelling, warmth, erythema.   Assessment and Plan:  Status post left first MPJ arthrodesis, doing well with no complications  -Treatment options discussed including all alternatives, risks, and complications -X-rays were obtained and reviewed with the patient.  3 views of the left foot were obtained.  No evidence of acute fracture.  Hardware intact with any complicating factors. -Orthotics were dispensed today.  Oral, written break instructions provided.  If he needs modifications to let me know and we can do this for him. -Sharp debrided the nails x10 without any complications or bleeding as a courtesy is minimal.  Hopefully the orthotics will help offload this with recommend  moisturizer daily. -Sharp debrided hyperkeratotic lesions x2 without any complications or bleeding. -Daily foot inspection.  Trula Slade DPM

## 2022-03-19 DIAGNOSIS — Z79899 Other long term (current) drug therapy: Secondary | ICD-10-CM | POA: Diagnosis not present

## 2022-03-19 DIAGNOSIS — R5383 Other fatigue: Secondary | ICD-10-CM | POA: Diagnosis not present

## 2022-03-19 DIAGNOSIS — Z0000 Encounter for general adult medical examination without abnormal findings: Secondary | ICD-10-CM | POA: Diagnosis not present

## 2022-03-19 DIAGNOSIS — E7800 Pure hypercholesterolemia, unspecified: Secondary | ICD-10-CM | POA: Diagnosis not present

## 2022-03-19 DIAGNOSIS — Z299 Encounter for prophylactic measures, unspecified: Secondary | ICD-10-CM | POA: Diagnosis not present

## 2022-04-04 ENCOUNTER — Ambulatory Visit: Payer: Medicare Other | Attending: Cardiology | Admitting: Cardiology

## 2022-04-04 ENCOUNTER — Encounter: Payer: Self-pay | Admitting: Cardiology

## 2022-04-04 VITALS — BP 132/82 | HR 82 | Ht 71.0 in | Wt 210.4 lb

## 2022-04-04 DIAGNOSIS — N1832 Chronic kidney disease, stage 3b: Secondary | ICD-10-CM | POA: Diagnosis not present

## 2022-04-04 DIAGNOSIS — E782 Mixed hyperlipidemia: Secondary | ICD-10-CM

## 2022-04-04 DIAGNOSIS — I25119 Atherosclerotic heart disease of native coronary artery with unspecified angina pectoris: Secondary | ICD-10-CM

## 2022-04-04 DIAGNOSIS — I350 Nonrheumatic aortic (valve) stenosis: Secondary | ICD-10-CM

## 2022-04-04 NOTE — Progress Notes (Signed)
Cardiology Office Note  Date: 04/04/2022   ID: Carl Young, DOB 08-01-1940, MRN [id number]  PCP:  Glenda Chroman, MD  Cardiologist:  Rozann Lesches, MD Electrophysiologist:  None   Chief Complaint  Patient presents with   Cardiac follow-up    History of Present Illness: Carl Young is an 81 y.o. male last assessed in March by Mr. Cleaver NP, I reviewed the note.  He is here with his wife for a follow-up visit.  He does not report any angina at this time, NYHA class II dyspnea.  Still has not quite regained all of his leg stamina after interval foot surgery.  I went over his recent lab work as noted below.  LDL was 70.  Triglycerides were 309 and we discussed increasing his omega-3 supplements to twice daily.  Last echocardiogram was in December 2021 at which point he had evidence of mild aortic stenosis, mean gradient was 11 mmHg.  We discussed getting an updated study prior to his next visit.  I personally reviewed his ECG today which shows sinus rhythm with PAC, nonspecific ST changes.  Past Medical History:  Diagnosis Date   Bell's palsy    CAD (coronary artery disease)    Multivessel status post CABG 05/2020 - LIMA to LAD, SVG to ramus intermedius, SVG to OM 2 and OM 3, and SVG to PDA   CKD (chronic kidney disease) stage 3, GFR 30-59 ml/min (HCC)    Essential hypertension    Helicobacter pylori (H. pylori) infection    History of GI bleed    Mixed hyperlipidemia    Nephrolithiasis    Postoperative atrial fibrillation [facility])     Past Surgical History:  Procedure Laterality Date   APPENDECTOMY     COLONOSCOPY  05/06/2012   Procedure: COLONOSCOPY;  Surgeon: Rogene Houston, MD;  [facility];  Service: Endoscopy;  Laterality: N/A;  730   COLONOSCOPY N/A 11/03/2019   Procedure: COLONOSCOPY;  Surgeon: Rogene Houston, MD;  [facility];  Service: Endoscopy;  Laterality: N/A;  930-rescheduled 5/5 same time per office   CORONARY ARTERY BYPASS  GRAFT N/A 06/12/2020   Procedure: CORONARY ARTERY BYPASS GRAFTING (CABG), ON PUMP, TIMES FIVE, USING LEFT INTERNAL MAMMARY ARTERY AND ENDOSCOPICALLY HARVESTED RIGHT GREATER SAPHENOUS VEIN;  Surgeon: Gaye Pollack, MD;  Location: [place];  Service: Open Heart Surgery;  Laterality: N/A;   HAMMER TOE SURGERY Left 07/09/2017   Procedure: HAMMER TOE CORRECTION 2ND DIGIT LEFT FOOT;  Surgeon: Caprice Beaver, DPM;  Location: [facility];  Service: Podiatry;  Laterality: Left;   LEFT HEART CATH AND CORONARY ANGIOGRAPHY N/A 06/09/2020   Procedure: LEFT HEART CATH AND CORONARY ANGIOGRAPHY;  Surgeon: Troy Sine, MD;  Location: [place] [facility];  Service: Cardiovascular;  Laterality: N/A;   METATARSAL HEAD EXCISION Left 08/25/2019   Procedure: METATARSAL HEAD RESECTION OF THE 2ND AND 3RD METATARSAL LEFT FOOT;  Surgeon: Caprice Beaver, DPM;  Location: [facility];  Service: Podiatry;  Laterality: Left;   POLYPECTOMY  11/03/2019   Procedure: POLYPECTOMY;  Surgeon: Rogene Houston, MD;  [facility];  Service: Endoscopy;;   TEE WITHOUT CARDIOVERSION N/A 06/12/2020   Procedure: TRANSESOPHAGEAL ECHOCARDIOGRAM (TEE);  Surgeon: Gaye Pollack, MD;  Location: [place];  Service: Open Heart Surgery;  Laterality: N/A;   WEIL OSTEOTOMY Left 07/09/2017   Procedure: WEIL OSTEOTOMY 2ND METATARSAL LEFT FOOT;  Surgeon: Caprice Beaver, DPM;  Location: [facility];  Service: Podiatry;  Laterality: Left;    Current Outpatient Medications  Medication Sig Dispense Refill   ascorbic acid (VITAMIN C) 1000 MG tablet Take 1,000 mg by mouth daily.      ASPIRIN LOW DOSE 81 MG tablet TAKE 1 TABLET BY MOUTH DAILY, swallow whole 90 tablet 1   benazepril (LOTENSIN) 40 MG tablet Take 40 mg by mouth daily.   4   Cholecalciferol (VITAMIN D3) 50 MCG (2000 UT) capsule Take 2,000 Units by mouth every evening.      fenofibrate (TRICOR) 145 MG tablet Take 145 mg by mouth every evening.      fish oil-omega-3 fatty acids 1000 MG capsule  Take 1 g by mouth at bedtime.      folic acid (FOLVITE) 062 MCG tablet Take 400 mcg by mouth daily.      Garlic 3762 MG CAPS Take 1,000 mg by mouth at bedtime.      Lysine 500 MG CAPS Take 500 mg by mouth daily.      metoprolol succinate (TOPROL-XL) 25 MG 24 hr tablet Take 1 tablet (25 mg total) by mouth daily. 90 tablet 3   Multiple Vitamin (MULTIVITAMIN WITH MINERALS) TABS Take 1 tablet by mouth daily.     Multiple Vitamins-Minerals (PRESERVISION AREDS 2+MULTI VIT PO) Take 1 capsule by mouth 2 (two) times daily.     rosuvastatin (CRESTOR) 40 MG tablet Take 1 tablet (40 mg total) by mouth daily. 30 tablet 1   zinc gluconate 50 MG tablet Take 50 mg by mouth every evening.      No current facility-administered medications for this visit.   Allergies:  Ciprofloxacin in d5w   ROS: No orthopnea or PND.  Physical Exam: VS:  BP 132/82 (BP Location: Left Arm, Patient Position: Sitting, Cuff Size: Normal)   Pulse 82   Ht '5\' 11"'$  (1.803 m)   Wt 210 lb 6.4 oz (95.4 kg)   SpO2 94%   BMI 29.34 kg/m , BMI Body mass index is 29.34 kg/m.  Wt Readings from Last 3 Encounters:  04/04/22 210 lb 6.4 oz (95.4 kg)  04/10/21 202 lb (91.6 kg)  11/13/20 204 lb 2.3 oz (92.6 kg)    General: Patient appears comfortable at rest. HEENT: Conjunctiva and lids normal. Neck: Supple, no elevated JVP or carotid bruits. Lungs: Clear to auscultation, nonlabored breathing at rest. Cardiac: Regular rate and rhythm, no S3, 2/6 systolic murmur. Extremities: No pitting edema.  ECG:  An ECG dated 08/02/2020 was personally reviewed today and demonstrated:  Sinus rhythm.  Recent Labwork: 08/02/2021: BUN 18; Creatinine, Ser 1.35; Hemoglobin 14.3; Platelets 173; Potassium 5.4; Sodium 143  September 2023: Hemoglobin 14.1, BUN 17, creatinine 1.21, platelets 169, potassium 4.3, AST 41, ALT 35, cholesterol 146, triglycerides 309, HDL 26, LDL 70, TSH 1.55  Other Studies Reviewed Today:  Echocardiogram 06/10/2020:  1. Left  ventricular ejection fraction, by estimation, is 55 to 60%. The  left ventricle has normal function. The left ventricle has no regional  wall motion abnormalities. There is mild concentric left ventricular  hypertrophy. Left ventricular diastolic  parameters are consistent with Grade I diastolic dysfunction (impaired  relaxation).   2. Right ventricular systolic function is normal. The right ventricular  size is normal. There is normal pulmonary artery systolic pressure. The  estimated right ventricular systolic pressure is 83.1 mmHg.   3. The mitral valve is normal in structure. Mild mitral valve  regurgitation. No evidence of mitral stenosis.   4. The aortic valve is normal in structure. There  is severe calcifcation  of the aortic valve. There is severe thickening of the aortic valve.  Aortic valve regurgitation is trivial. Mild aortic valve stenosis. Aortic  valve mean gradient measures 11.0  mmHg.   5. The inferior vena cava is normal in size with greater than 50%  respiratory variability, suggesting right atrial pressure of 3 mmHg.   Assessment and Plan:  1.  Multivessel CAD status post CABG in December 2021.  He is stable without active angina at this time.  ECG reviewed.  Continue aspirin, Lotensin, Toprol-XL, and Crestor.  2.  Mixed hyperlipidemia, continue current dose of Crestor and fenofibrate.  Increase omega-3 supplements to 1000 mg twice daily.  3.  Mild aortic stenosis by echocardiogram in 2021.  Updated study will be obtained prior to next visit.  4.  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  Medication Adjustments/Labs and Tests Ordered: Current medicines are reviewed at length with the patient today.  Concerns regarding medicines are outlined above.   Tests Ordered: Orders Placed This Encounter  Procedures   EKG 12-Lead   ECHOCARDIOGRAM COMPLETE    Medication Changes: No orders of the defined types were placed in this encounter.   Disposition:  Follow up  6  months.  Signed, Satira Sark, MD, [facility] 04/04/2022 4:29 PM    [place] [facility] [place] [address], [place], [place] [address] Phone: [phone]; Fax: [phone]

## 2022-04-04 NOTE — Patient Instructions (Addendum)

## 2022-04-08 ENCOUNTER — Other Ambulatory Visit: Payer: Medicare Other

## 2022-04-09 ENCOUNTER — Ambulatory Visit: Payer: Medicare Other | Attending: Cardiology

## 2022-04-09 DIAGNOSIS — I25119 Atherosclerotic heart disease of native coronary artery with unspecified angina pectoris: Secondary | ICD-10-CM | POA: Diagnosis not present

## 2022-04-09 LAB — ECHOCARDIOGRAM COMPLETE
AR max vel: 1.14 cm2
AV Area VTI: 1.16 cm2
AV Area mean vel: 1.15 cm2
AV Mean grad: 15.8 mmHg
AV Peak grad: 26.8 mmHg
Ao pk vel: 2.59 m/s
Area-P 1/2: 3.5 cm2
Calc EF: 60.4 %
MV M vel: 5.52 m/s
MV Peak grad: 121.9 mmHg
P 1/2 time: 2432 msec
S' Lateral: 3.11 cm
Single Plane A2C EF: 56.2 %
Single Plane A4C EF: 62.9 %

## 2022-04-24 DIAGNOSIS — H353132 Nonexudative age-related macular degeneration, bilateral, intermediate dry stage: Secondary | ICD-10-CM | POA: Diagnosis not present

## 2022-04-24 DIAGNOSIS — H2513 Age-related nuclear cataract, bilateral: Secondary | ICD-10-CM | POA: Diagnosis not present

## 2022-04-27 ENCOUNTER — Other Ambulatory Visit: Payer: Self-pay | Admitting: Cardiology

## 2022-06-17 DIAGNOSIS — J069 Acute upper respiratory infection, unspecified: Secondary | ICD-10-CM | POA: Diagnosis not present

## 2022-06-17 DIAGNOSIS — R0981 Nasal congestion: Secondary | ICD-10-CM | POA: Diagnosis not present

## 2022-07-26 ENCOUNTER — Other Ambulatory Visit: Payer: Self-pay | Admitting: Cardiology

## 2022-08-06 DIAGNOSIS — L97521 Non-pressure chronic ulcer of other part of left foot limited to breakdown of skin: Secondary | ICD-10-CM | POA: Diagnosis not present

## 2022-08-20 ENCOUNTER — Other Ambulatory Visit: Payer: Self-pay | Admitting: Cardiology

## 2022-08-21 DIAGNOSIS — H353221 Exudative age-related macular degeneration, left eye, with active choroidal neovascularization: Secondary | ICD-10-CM | POA: Diagnosis not present

## 2022-08-22 DIAGNOSIS — L97521 Non-pressure chronic ulcer of other part of left foot limited to breakdown of skin: Secondary | ICD-10-CM | POA: Diagnosis not present

## 2022-08-29 ENCOUNTER — Other Ambulatory Visit: Payer: Self-pay | Admitting: Cardiology

## 2022-08-30 ENCOUNTER — Telehealth: Payer: Self-pay | Admitting: Cardiology

## 2022-08-30 MED ORDER — AMLODIPINE BESYLATE 5 MG PO TABS: 5.0000 mg | ORAL_TABLET | Freq: Every day | ORAL | 1 refills | Status: DC

## 2022-08-30 NOTE — Telephone Encounter (Signed)
Patient's wife is calling saying that the patient is almost out of amlodipine but that medication is not on his medication list.

## 2022-08-30 NOTE — Telephone Encounter (Signed)
[name] contacted and confirmed with Estill Bamberg, amlodipine 5 mg daily last filled 05/07/2022 #90 Medication profile updated and refill sent.

## 2022-08-30 NOTE — Telephone Encounter (Signed)
Per wife patient has been taking amlodipine 5 mg daily although it was reported that he was no longer taking this at last visit in October 2023. Advised that it would be sent to provider for approval of amlodipine 5 mg refill.

## 2022-09-11 DIAGNOSIS — H353221 Exudative age-related macular degeneration, left eye, with active choroidal neovascularization: Secondary | ICD-10-CM | POA: Diagnosis not present

## 2022-09-11 DIAGNOSIS — H2513 Age-related nuclear cataract, bilateral: Secondary | ICD-10-CM | POA: Diagnosis not present

## 2022-09-12 DIAGNOSIS — L97521 Non-pressure chronic ulcer of other part of left foot limited to breakdown of skin: Secondary | ICD-10-CM | POA: Diagnosis not present

## 2022-09-18 DIAGNOSIS — H353221 Exudative age-related macular degeneration, left eye, with active choroidal neovascularization: Secondary | ICD-10-CM | POA: Diagnosis not present

## 2022-09-24 DIAGNOSIS — Z299 Encounter for prophylactic measures, unspecified: Secondary | ICD-10-CM | POA: Diagnosis not present

## 2022-09-24 DIAGNOSIS — I10 Essential (primary) hypertension: Secondary | ICD-10-CM | POA: Diagnosis not present

## 2022-09-24 DIAGNOSIS — Z01818 Encounter for other preprocedural examination: Secondary | ICD-10-CM | POA: Diagnosis not present

## 2022-09-24 DIAGNOSIS — I700 Atherosclerosis of aorta: Secondary | ICD-10-CM | POA: Diagnosis not present

## 2022-10-03 DIAGNOSIS — L97521 Non-pressure chronic ulcer of other part of left foot limited to breakdown of skin: Secondary | ICD-10-CM | POA: Diagnosis not present

## 2022-10-10 ENCOUNTER — Encounter: Payer: Self-pay | Admitting: Cardiology

## 2022-10-10 ENCOUNTER — Ambulatory Visit: Payer: Medicare Other | Attending: Cardiology | Admitting: Cardiology

## 2022-10-10 VITALS — BP 154/80 | HR 76 | Ht 71.0 in | Wt 211.8 lb

## 2022-10-10 DIAGNOSIS — I25119 Atherosclerotic heart disease of native coronary artery with unspecified angina pectoris: Secondary | ICD-10-CM

## 2022-10-10 DIAGNOSIS — E782 Mixed hyperlipidemia: Secondary | ICD-10-CM

## 2022-10-10 DIAGNOSIS — I350 Nonrheumatic aortic (valve) stenosis: Secondary | ICD-10-CM | POA: Diagnosis not present

## 2022-10-10 DIAGNOSIS — N1832 Chronic kidney disease, stage 3b: Secondary | ICD-10-CM

## 2022-10-10 NOTE — Patient Instructions (Addendum)
Medication Instructions:  Your physician recommends that you continue on your current medications as directed. Please refer to the Current Medication list given to you today.  Labwork: none  Testing/Procedures: Your physician has requested that you have an echocardiogram in October 2024. Echocardiography is a painless test that uses sound waves to create images of your heart. It provides your doctor with information about the size and shape of your heart and how well your heart's chambers and valves are working. This procedure takes approximately one hour. There are no restrictions for this procedure. Please do NOT wear cologne, perfume, aftershave, or lotions (deodorant is allowed). Please arrive 15 minutes prior to your appointment time.  Follow-Up: Your physician recommends that you schedule a follow-up appointment in: 4-6 weeks  Any Other Special Instructions Will Be Listed Below (If Applicable).  If you need a refill on your cardiac medications before your next appointment, please call your pharmacy.

## 2022-10-10 NOTE — Progress Notes (Signed)
Cardiology Office Note  Date: 10/10/2022   ID: Carl Young, DOB 19-Jan-1941, MRN [id number]  History of Present Illness: Carl Young is an 82 y.o. male last seen in October 2023.  He is here with his wife for a follow-up visit.  Reports no angina, general decrease in stamina as before which is most likely multifactorial.  Blood pressure has been elevated and he just recently had Toprol-XL increased to 25 mg twice daily by his PCP in anticipation of cataract surgery.  In the past we had cut back his beta-blocker due to fatigue.  His last echocardiogram was in October 2023 as noted below.  We discussed getting a follow-up study this October for reassessment of his aortic stenosis.  He does not report any palpitations or syncope.  I reviewed the remainder of his medications which are otherwise stable.  LDL was 75 in February of last year.  Physical Exam: VS:  BP (!) 154/80   Pulse 76   Ht 5\' 11"  (1.803 m)   Wt 211 lb 12.8 oz (96.1 kg)   SpO2 93%   BMI 29.54 kg/m , BMI Body mass index is 29.54 kg/m.  Wt Readings from Last 3 Encounters:  10/10/22 211 lb 12.8 oz (96.1 kg)  04/04/22 210 lb 6.4 oz (95.4 kg)  04/10/21 202 lb (91.6 kg)    General: Patient appears comfortable at rest. HEENT: Conjunctiva and lids normal. Neck: Supple, no elevated JVP or carotid bruits. Lungs: Clear to auscultation, nonlabored breathing at rest. Cardiac: Regular rate and rhythm, no S3, 3/6 systolic murmur. Extremities: No pitting edema.  ECG:  An ECG dated 04/04/2022 was personally reviewed today and demonstrated:  Sinus rhythm with PAC, nonspecific ST changes.  Labwork:  February 2023: BUN 19, creatinine 1.28, potassium 4.4, AST 34, ALT 28, cholesterol 144, triglycerides 238, HDL 30, LDL 75, hemoglobin 14.3, platelets 173  Other Studies Reviewed Today:  Echocardiogram 04/09/2022:  1. Left ventricular ejection fraction, by estimation, is 60 to 65%. The  left ventricle has normal function.  The left ventricle has no regional  wall motion abnormalities. There is mild asymmetric left ventricular  hypertrophy of the basal segment. Left  ventricular diastolic parameters are consistent with Grade II diastolic  dysfunction (pseudonormalization). The average left ventricular global  longitudinal strain is -24.2 %. The global longitudinal strain is normal.   2. Right ventricular systolic function is normal. The right ventricular  size is normal. There is normal pulmonary artery systolic pressure. The  estimated right ventricular systolic pressure is 25.7 mmHg.   3. The mitral valve is abnormal. Mild mitral valve regurgitation.   4. The aortic valve has an indeterminant number of cusps. There is  moderate calcification of the aortic valve. Aortic valve regurgitation is  trivial. Moderate aortic valve stenosis. Aortic regurgitation PHT measures  2432 msec. Aortic valve area, by VTI  measures 1.16 cm. Aortic valve mean gradient measures 15.8 mmHg.   5. The inferior vena cava is normal in size with greater than 50%  respiratory variability, suggesting right atrial pressure of 3 mmHg.   Assessment and Plan:  1.  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  Echocardiogram in October 2023 revealed LVEF 60 to 65%.  He does not report any definite angina at this time.  Continue aspirin, Toprol-XL, and Crestor.  2.  Degenerative calcific aortic stenosis.  Echocardiogram in  October 2023 revealed evidence of moderate stenosis with mean AV gradient 15.8 mmHg and dimensionless index 0.41.  Plan to obtain a follow-up echocardiogram in October of this year for surveillance.  3.  Mixed hyperlipidemia.  LDL 75 with triglycerides 025 in February 2023.  4.  Essential hypertension.  Blood pressure has been up recently.  Toprol-XL just increased to 25 mg twice daily by PCP.  In the past we had cut back his beta-blocker  due to fatigue.  We will set up a follow-up visit after he gets through cataract surgery and may think about switching his Toprol-XL dose back with up titration of Norvasc.  5.  CKD stage IIIb.  Creatinine 1.28 in February 2023.  Disposition:  Follow up  4 to 6 weeks.  Signed, Jonelle Sidle, M.D., F.A.C.C. [facility] at [facility]

## 2022-10-11 DIAGNOSIS — H269 Unspecified cataract: Secondary | ICD-10-CM | POA: Diagnosis not present

## 2022-10-11 DIAGNOSIS — H2512 Age-related nuclear cataract, left eye: Secondary | ICD-10-CM | POA: Diagnosis not present

## 2022-10-16 DIAGNOSIS — H353221 Exudative age-related macular degeneration, left eye, with active choroidal neovascularization: Secondary | ICD-10-CM | POA: Diagnosis not present

## 2022-10-24 DIAGNOSIS — L97521 Non-pressure chronic ulcer of other part of left foot limited to breakdown of skin: Secondary | ICD-10-CM | POA: Diagnosis not present

## 2022-10-25 DIAGNOSIS — H269 Unspecified cataract: Secondary | ICD-10-CM | POA: Diagnosis not present

## 2022-10-25 DIAGNOSIS — H2511 Age-related nuclear cataract, right eye: Secondary | ICD-10-CM | POA: Diagnosis not present

## 2022-11-14 DIAGNOSIS — L97521 Non-pressure chronic ulcer of other part of left foot limited to breakdown of skin: Secondary | ICD-10-CM | POA: Diagnosis not present

## 2022-11-15 ENCOUNTER — Encounter: Payer: Self-pay | Admitting: *Deleted

## 2022-11-15 ENCOUNTER — Encounter: Payer: Self-pay | Admitting: Nurse Practitioner

## 2022-11-15 ENCOUNTER — Ambulatory Visit: Payer: Medicare Other | Attending: Nurse Practitioner | Admitting: Nurse Practitioner

## 2022-11-15 VITALS — BP 158/88 | HR 82 | Ht 71.0 in | Wt 209.6 lb

## 2022-11-15 DIAGNOSIS — E782 Mixed hyperlipidemia: Secondary | ICD-10-CM

## 2022-11-15 DIAGNOSIS — I2510 Atherosclerotic heart disease of native coronary artery without angina pectoris: Secondary | ICD-10-CM

## 2022-11-15 DIAGNOSIS — I350 Nonrheumatic aortic (valve) stenosis: Secondary | ICD-10-CM | POA: Diagnosis not present

## 2022-11-15 DIAGNOSIS — I10 Essential (primary) hypertension: Secondary | ICD-10-CM | POA: Diagnosis not present

## 2022-11-15 DIAGNOSIS — N1832 Chronic kidney disease, stage 3b: Secondary | ICD-10-CM

## 2022-11-15 MED ORDER — AMLODIPINE BESYLATE 10 MG PO TABS: 10.0000 mg | ORAL_TABLET | Freq: Every day | ORAL | 6 refills | Status: DC

## 2022-11-15 NOTE — Patient Instructions (Addendum)
Medication Instructions:   Increase Amlodipine to 10mg  daily  Continue all other medications.     Labwork:  none  Testing/Procedures:  none  Follow-Up:  6 months   Any Other Special Instructions Will Be Listed Below (If Applicable).  BP log  Salty six  Nurse BP check 3-4 weeks, bring monitor for comparison & log with you.   If you need a refill on your cardiac medications before your next appointment, please call your pharmacy.

## 2022-11-15 NOTE — Progress Notes (Unsigned)
Office Visit    Patient Name: Carl Young Date of Encounter: 11/15/2022 PCP:  Ignatius Specking, MD   [facility]  Cardiologist:  Nona Dell, MD  Advanced Practice Provider:  No care team member to display Electrophysiologist:  None   Chief Complaint    Carl Young is a 82 y.o. male with a hx of CAD, s/p CABG in 2021, aortic valve stenosis, hypertension, mixed hyperlipidemia, history of Bell's palsy, hx of post-op A-fib, CKD stage IIIb, who presents today for 4-6 week follow-up.    Past Medical History    Past Medical History:  Diagnosis Date   Bell's palsy    CAD (coronary artery disease)    Multivessel status post CABG 05/2020 - LIMA to LAD, SVG to ramus intermedius, SVG to OM 2 and OM 3, and SVG to PDA   CKD (chronic kidney disease) stage 3, GFR 30-59 ml/min (HCC)    Essential hypertension    Helicobacter pylori (H. pylori) infection    History of GI bleed    Mixed hyperlipidemia    Nephrolithiasis    Postoperative atrial fibrillation [facility])    Past Surgical History:  Procedure Laterality Date   APPENDECTOMY     COLONOSCOPY  05/06/2012   Procedure: COLONOSCOPY;  Surgeon: Malissa Hippo, MD;  Location: [facility];  Service: Endoscopy;  Laterality: N/A;  730   COLONOSCOPY N/A 11/03/2019   Procedure: COLONOSCOPY;  Surgeon: Malissa Hippo, MD;  Location: [facility];  Service: Endoscopy;  Laterality: N/A;  930-rescheduled 5/5 same time per office   CORONARY ARTERY BYPASS GRAFT N/A 06/12/2020   Procedure: CORONARY ARTERY BYPASS GRAFTING (CABG), ON PUMP, TIMES FIVE, USING LEFT INTERNAL MAMMARY ARTERY AND ENDOSCOPICALLY HARVESTED RIGHT GREATER SAPHENOUS VEIN;  Surgeon: Alleen Borne, MD;  Location: [facility] OR;  Service: Open Heart Surgery;  Laterality: N/A;   HAMMER TOE SURGERY Left 07/09/2017   Procedure: HAMMER TOE CORRECTION 2ND DIGIT LEFT FOOT;  Surgeon: Ferman Hamming, DPM;  Location: [facility] ORS;  Service: Podiatry;  Laterality: Left;    LEFT HEART CATH AND CORONARY ANGIOGRAPHY N/A 06/09/2020   Procedure: LEFT HEART CATH AND CORONARY ANGIOGRAPHY;  Surgeon: Lennette Bihari, MD;  Location: [facility] INVASIVE CV LAB;  Service: Cardiovascular;  Laterality: N/A;   METATARSAL HEAD EXCISION Left 08/25/2019   Procedure: METATARSAL HEAD RESECTION OF THE 2ND AND 3RD METATARSAL LEFT FOOT;  Surgeon: Ferman Hamming, DPM;  Location: [facility] ORS;  Service: Podiatry;  Laterality: Left;   POLYPECTOMY  11/03/2019   Procedure: POLYPECTOMY;  Surgeon: Malissa Hippo, MD;  Location: [facility];  Service: Endoscopy;;   TEE WITHOUT CARDIOVERSION N/A 06/12/2020   Procedure: TRANSESOPHAGEAL ECHOCARDIOGRAM (TEE);  Surgeon: Alleen Borne, MD;  Location: [facility] OR;  Service: Open Heart Surgery;  Laterality: N/A;   WEIL OSTEOTOMY Left 07/09/2017   Procedure: WEIL OSTEOTOMY 2ND METATARSAL LEFT FOOT;  Surgeon: Ferman Hamming, DPM;  Location: [facility] ORS;  Service: Podiatry;  Laterality: Left;    Allergies  Allergies  Allergen Reactions   Ciprofloxacin In D5w Rash and Other (See Comments)    Pruritis     History of Present Illness    Carl Young is a 82 y.o. male with a PMH as mentioned above.   TTE 03/2022 revealed evidence of moderate aortic valve stenosis with mean gradient at 15.8 mmHg.  Last seen by Dr. Diona Browner on October 10, 2022.  He denied any chest pain, did admit to decrease in stamina.  Echocardiogram was arranged for October 2024.  It was suggested that Toprol may need to be switched to Norvasc with up titration for better BP control.  Today he presents for 4 to 6-week follow-up.  He states he is doing well. No significant changes since last OV. Denies any chest pain, shortness of breath, palpitations, syncope, presyncope, dizziness, orthopnea, PND, swelling or significant weight changes, acute bleeding, or claudication. Does not check BP at home.   EKGs/Labs/Other Studies Reviewed:   The following studies were reviewed today:   EKG:  EKG is  not ordered today.    Echo 03/2022:    1. Left ventricular ejection fraction, by estimation, is 60 to 65%. The  left ventricle has normal function. The left ventricle has no regional  wall motion abnormalities. There is mild asymmetric left ventricular  hypertrophy of the basal segment. Left  ventricular diastolic parameters are consistent with Grade II diastolic  dysfunction (pseudonormalization). The average left ventricular global  longitudinal strain is -24.2 %. The global longitudinal strain is normal.   2. Right ventricular systolic function is normal. The right ventricular  size is normal. There is normal pulmonary artery systolic pressure. The  estimated right ventricular systolic pressure is 25.7 mmHg.   3. The mitral valve is abnormal. Mild mitral valve regurgitation.   4. The aortic valve has an indeterminant number of cusps. There is  moderate calcification of the aortic valve. Aortic valve regurgitation is  trivial. Moderate aortic valve stenosis. Aortic regurgitation PHT measures  2432 msec. Aortic valve area, by VTI  measures 1.16 cm. Aortic valve mean gradient measures 15.8 mmHg.   5. The inferior vena cava is normal in size with greater than 50%  respiratory variability, suggesting right atrial pressure of 3 mmHg.   Comparison(s): Prior images reviewed side by side. LVEF normal range at  60-65%. Aortic stenosis is moderate with mean gradient up to 16 mmHg and  dimentionless index 0.41.   Recent Labs: No results found for requested labs within last 365 days.  Recent Lipid Panel No results found for: "CHOL", "TRIG", "HDL", "CHOLHDL", "VLDL", "LDLCALC", "LDLDIRECT"  Home Medications   Current Meds  Medication Sig   ascorbic acid (VITAMIN C) 1000 MG tablet Take 1,000 mg by mouth daily.    ASPIRIN LOW DOSE 81 MG tablet TAKE 1 TABLET BY MOUTH DAILY, swallow whole   benazepril (LOTENSIN) 40 MG tablet Take 40 mg by mouth daily.    Cholecalciferol (VITAMIN D3) 50 MCG  (2000 UT) capsule Take 2,000 Units by mouth every evening.    fenofibrate (TRICOR) 145 MG tablet Take 145 mg by mouth every evening.    fish oil-omega-3 fatty acids 1000 MG capsule Take 2 g by mouth at bedtime.   folic acid (FOLVITE) 400 MCG tablet Take 400 mcg by mouth daily.    Garlic 1000 MG CAPS Take 1,000 mg by mouth at bedtime.    Lysine 500 MG CAPS Take 500 mg by mouth daily.    metoprolol succinate (TOPROL-XL) 25 MG 24 hr tablet Take 25 mg by mouth 2 (two) times daily.   Multiple Vitamin (MULTIVITAMIN WITH MINERALS) TABS Take 1 tablet by mouth daily.   Multiple Vitamins-Minerals (PRESERVISION AREDS 2+MULTI VIT PO) Take 1 capsule by mouth 2 (two) times daily.   rosuvastatin (CRESTOR) 40 MG tablet Take 1 tablet (40 mg total) by mouth daily.   zinc gluconate 50 MG tablet Take 50 mg by mouth every evening.    amLODipine (NORVASC) 5 MG tablet Take  1 tablet (5 mg total) by mouth daily.     Review of Systems   All other systems reviewed and are otherwise negative except as noted above.  Physical Exam    VS:  BP (!) 158/88   Pulse 82   Ht 5\' 11"  (1.803 m)   Wt 209 lb 9.6 oz (95.1 kg)   SpO2 93%   BMI 29.23 kg/m  , BMI Body mass index is 29.23 kg/m.  Wt Readings from Last 3 Encounters:  11/15/22 209 lb 9.6 oz (95.1 kg)  10/10/22 211 lb 12.8 oz (96.1 kg)  04/04/22 210 lb 6.4 oz (95.4 kg)     GEN: Well nourished, well developed, in no acute distress. HEENT: normal. Neck: Supple, no JVD, carotid bruits, or masses. Cardiac: S1/S2, RRR, Grade 2/6 murmur, no rubs, no gallops. No clubbing, cyanosis, edema.  Radials/PT 2+ and equal bilaterally.  Respiratory:  Respirations regular and unlabored, clear to auscultation bilaterally. MS: No deformity or atrophy. Skin: Warm and dry, no rash. Neuro:  Strength and sensation are intact. Psych: Normal affect.  Assessment & Plan    Aortic valve stenosis TTE 03/2022 revealed moderate AS with gradient measuring 15.8 mmHg. Denies any red  flag symptoms. Doing well. Follow-up Echo arranged for 04/2023 for surveillance.   HTN BP elevated today. Will increase Amlodipine to 10 mg daily. Continue benazepril and Toprol XL. Discussed to monitor BP at home at least 2 hours after medications and sitting for 5-10 minutes. Given BP log and salty six. Heart healthy diet and regular cardiovascular exercise encouraged. Will request most recent labs from Dr. Bonnita Levan office. Will bring back in 3-4 weeks for nurse visit, have asked him to bring his BP log and BP monitor to check for accuracy.   CAD, s/p CABG in 2021 Stable with no anginal symptoms. No indication for ischemic evaluation. Continue current medication regimen. Heart healthy diet and regular cardiovascular exercise encouraged.   Mixed HLD LDL 70 from 03/2022. Continue rosuvastatin and fenofibrate. Heart healthy diet and regular cardiovascular exercise encouraged.   CKD stage 3b Do not have recent labs. Will request most recent labs from PCP. Avoid nephrotoxic agents. Continue to follow with PCP.  Disposition: Follow up in 6 month(s) with Nona Dell, MD or APP.  Signed, Sharlene Dory, NP 11/18/2022, 11:21 AM [facility]

## 2022-12-04 ENCOUNTER — Ambulatory Visit: Payer: Medicare Other | Attending: Cardiology | Admitting: *Deleted

## 2022-12-04 ENCOUNTER — Encounter: Payer: Self-pay | Admitting: *Deleted

## 2022-12-04 VITALS — BP 146/70 | HR 72 | Ht 71.0 in | Wt 212.2 lb

## 2022-12-04 DIAGNOSIS — I10 Essential (primary) hypertension: Secondary | ICD-10-CM | POA: Diagnosis not present

## 2022-12-04 DIAGNOSIS — Z79899 Other long term (current) drug therapy: Secondary | ICD-10-CM

## 2022-12-04 NOTE — Patient Instructions (Addendum)
Your physician recommends that you continue on your current medications as directed. Please refer to the Current Medication list given to you today. Your physician recommends that you schedule a follow-up appointment in: as planned We will contact you with any changes

## 2022-12-04 NOTE — Progress Notes (Signed)
Presents to office for nurse visit for BP check per last OV with [facility] with home BP monitor and BP log. Accompanied by wife. 2.HTN BP elevated today. Will increase Amlodipine to 10 mg daily. Continue benazepril and Toprol XL. Discussed to monitor BP at home at least 2 hours after medications and sitting for 5-10 minutes. Given BP log and salty six. Heart healthy diet and regular cardiovascular exercise encouraged. Will request most recent labs from Dr. Bonnita Levan office. Will bring back in 3-4 weeks for nurse visit, have asked him to bring his BP log and BP monitor to check for accuracy.   Reports taking all medications as prescribed without missing doses or side effects. Denies dizziness, chest pain or sob. Vitals taken and compared to home BP monitor. Home BP monitor reading: BP 143/81 & HR 77 Routed to provider for review

## 2022-12-05 ENCOUNTER — Other Ambulatory Visit: Payer: Self-pay | Admitting: *Deleted

## 2022-12-05 DIAGNOSIS — Z79899 Other long term (current) drug therapy: Secondary | ICD-10-CM

## 2022-12-05 DIAGNOSIS — L97521 Non-pressure chronic ulcer of other part of left foot limited to breakdown of skin: Secondary | ICD-10-CM | POA: Diagnosis not present

## 2022-12-05 DIAGNOSIS — I10 Essential (primary) hypertension: Secondary | ICD-10-CM

## 2022-12-05 NOTE — Progress Notes (Signed)
Patient's wife Corrie Dandy informed and verbalized understanding of plan. Lab order faxed to [facility]

## 2022-12-05 NOTE — Progress Notes (Signed)
Sharlene Dory, NP  Eustace Moore, RN Don't have most recent labs from PCP. If it's okay with him, would like to check his kidney function first before adding on medication to his regimen. Would recommend obtaining BMET in 1-2 weeks for medication management. Then if kidney function permits, planning on adding on Aldactone 12.5 mg daily.  Thanks!  Best, Sharlene Dory, NP

## 2022-12-05 NOTE — Addendum Note (Signed)
Addended by: Eustace Moore on: 12/05/2022 08:22 AM   Modules accepted: Orders

## 2022-12-20 DIAGNOSIS — I10 Essential (primary) hypertension: Secondary | ICD-10-CM | POA: Diagnosis not present

## 2022-12-20 DIAGNOSIS — Z79899 Other long term (current) drug therapy: Secondary | ICD-10-CM | POA: Diagnosis not present

## 2022-12-20 LAB — BASIC METABOLIC PANEL
BUN/Creatinine Ratio: 17 (calc) (ref 6–22)
BUN: 23 mg/dL (ref 7–25)
CO2: 26 mmol/L (ref 20–32)
Calcium: 9.7 mg/dL (ref 8.6–10.3)
Chloride: 107 mmol/L (ref 98–110)
Creat: 1.38 mg/dL — ABNORMAL HIGH (ref 0.70–1.22)
Glucose, Bld: 77 mg/dL (ref 65–139)
Potassium: 4.5 mmol/L (ref 3.5–5.3)
Sodium: 142 mmol/L (ref 135–146)

## 2022-12-24 ENCOUNTER — Telehealth: Payer: Self-pay

## 2022-12-24 MED ORDER — METOPROLOL SUCCINATE ER 50 MG PO TB24: 50.0000 mg | ORAL_TABLET | Freq: Two times a day (BID) | ORAL | 1 refills | Status: DC

## 2022-12-24 NOTE — Telephone Encounter (Signed)
-----   Message from Sharlene Dory, NP sent at 12/23/2022  4:20 PM EDT ----- Kidney function stable. Please increase Metoprolol to 50 mg twice a day from 25 mg twice a day. Keep follow-up as scheduled.   Sharlene Dory, AGNP-C

## 2022-12-24 NOTE — Telephone Encounter (Signed)
Called patient advised him of the increase of Metoprolol 50 mg twice a day and that his kidney function is stable. Patient verbalized understanding

## 2022-12-26 DIAGNOSIS — L97521 Non-pressure chronic ulcer of other part of left foot limited to breakdown of skin: Secondary | ICD-10-CM | POA: Diagnosis not present

## 2022-12-30 DIAGNOSIS — N1832 Chronic kidney disease, stage 3b: Secondary | ICD-10-CM | POA: Diagnosis not present

## 2022-12-30 DIAGNOSIS — Z0000 Encounter for general adult medical examination without abnormal findings: Secondary | ICD-10-CM | POA: Diagnosis not present

## 2022-12-30 DIAGNOSIS — I10 Essential (primary) hypertension: Secondary | ICD-10-CM | POA: Diagnosis not present

## 2022-12-30 DIAGNOSIS — Z299 Encounter for prophylactic measures, unspecified: Secondary | ICD-10-CM | POA: Diagnosis not present

## 2022-12-30 DIAGNOSIS — D692 Other nonthrombocytopenic purpura: Secondary | ICD-10-CM | POA: Diagnosis not present

## 2022-12-30 DIAGNOSIS — I25119 Atherosclerotic heart disease of native coronary artery with unspecified angina pectoris: Secondary | ICD-10-CM | POA: Diagnosis not present

## 2022-12-30 DIAGNOSIS — Z7189 Other specified counseling: Secondary | ICD-10-CM | POA: Diagnosis not present

## 2022-12-30 DIAGNOSIS — Z87891 Personal history of nicotine dependence: Secondary | ICD-10-CM | POA: Diagnosis not present

## 2023-01-13 DIAGNOSIS — D225 Melanocytic nevi of trunk: Secondary | ICD-10-CM | POA: Diagnosis not present

## 2023-01-13 DIAGNOSIS — X32XXXD Exposure to sunlight, subsequent encounter: Secondary | ICD-10-CM | POA: Diagnosis not present

## 2023-01-13 DIAGNOSIS — Z1283 Encounter for screening for malignant neoplasm of skin: Secondary | ICD-10-CM | POA: Diagnosis not present

## 2023-01-13 DIAGNOSIS — L820 Inflamed seborrheic keratosis: Secondary | ICD-10-CM | POA: Diagnosis not present

## 2023-01-13 DIAGNOSIS — L570 Actinic keratosis: Secondary | ICD-10-CM | POA: Diagnosis not present

## 2023-01-16 DIAGNOSIS — L97521 Non-pressure chronic ulcer of other part of left foot limited to breakdown of skin: Secondary | ICD-10-CM | POA: Diagnosis not present

## 2023-01-25 ENCOUNTER — Other Ambulatory Visit: Payer: Self-pay | Admitting: Cardiology

## 2023-01-30 DIAGNOSIS — L97521 Non-pressure chronic ulcer of other part of left foot limited to breakdown of skin: Secondary | ICD-10-CM | POA: Diagnosis not present

## 2023-02-13 DIAGNOSIS — L97521 Non-pressure chronic ulcer of other part of left foot limited to breakdown of skin: Secondary | ICD-10-CM | POA: Diagnosis not present

## 2023-02-19 DIAGNOSIS — H353222 Exudative age-related macular degeneration, left eye, with inactive choroidal neovascularization: Secondary | ICD-10-CM | POA: Diagnosis not present

## 2023-02-27 DIAGNOSIS — L97521 Non-pressure chronic ulcer of other part of left foot limited to breakdown of skin: Secondary | ICD-10-CM | POA: Diagnosis not present

## 2023-03-20 DIAGNOSIS — L97521 Non-pressure chronic ulcer of other part of left foot limited to breakdown of skin: Secondary | ICD-10-CM | POA: Diagnosis not present

## 2023-03-27 DIAGNOSIS — E7800 Pure hypercholesterolemia, unspecified: Secondary | ICD-10-CM | POA: Diagnosis not present

## 2023-03-27 DIAGNOSIS — Z299 Encounter for prophylactic measures, unspecified: Secondary | ICD-10-CM | POA: Diagnosis not present

## 2023-03-27 DIAGNOSIS — Z79899 Other long term (current) drug therapy: Secondary | ICD-10-CM | POA: Diagnosis not present

## 2023-03-27 DIAGNOSIS — R5383 Other fatigue: Secondary | ICD-10-CM | POA: Diagnosis not present

## 2023-03-27 DIAGNOSIS — I10 Essential (primary) hypertension: Secondary | ICD-10-CM | POA: Diagnosis not present

## 2023-03-27 DIAGNOSIS — Z0000 Encounter for general adult medical examination without abnormal findings: Secondary | ICD-10-CM | POA: Diagnosis not present

## 2023-04-01 ENCOUNTER — Ambulatory Visit: Payer: Medicare Other | Attending: Cardiology

## 2023-04-01 DIAGNOSIS — I350 Nonrheumatic aortic (valve) stenosis: Secondary | ICD-10-CM | POA: Diagnosis not present

## 2023-04-02 LAB — ECHOCARDIOGRAM COMPLETE
AR max vel: 1.06 cm2
AV Area VTI: 0.95 cm2
AV Area mean vel: 1 cm2
AV Mean grad: 23 mm[Hg]
AV Peak grad: 39.7 mm[Hg]
Ao pk vel: 3.15 m/s
Area-P 1/2: 2.91 cm2
Calc EF: 62.6 %
MV VTI: 1.97 cm2
P 1/2 time: 728 ms
S' Lateral: 3 cm
Single Plane A2C EF: 65.3 %
Single Plane A4C EF: 61.9 %

## 2023-04-03 DIAGNOSIS — L97521 Non-pressure chronic ulcer of other part of left foot limited to breakdown of skin: Secondary | ICD-10-CM | POA: Diagnosis not present

## 2023-04-17 DIAGNOSIS — L97521 Non-pressure chronic ulcer of other part of left foot limited to breakdown of skin: Secondary | ICD-10-CM | POA: Diagnosis not present

## 2023-04-24 ENCOUNTER — Other Ambulatory Visit: Payer: Self-pay | Admitting: Nurse Practitioner

## 2023-04-30 DIAGNOSIS — H353132 Nonexudative age-related macular degeneration, bilateral, intermediate dry stage: Secondary | ICD-10-CM | POA: Diagnosis not present

## 2023-05-01 DIAGNOSIS — L97521 Non-pressure chronic ulcer of other part of left foot limited to breakdown of skin: Secondary | ICD-10-CM | POA: Diagnosis not present

## 2023-05-22 DIAGNOSIS — C44319 Basal cell carcinoma of skin of other parts of face: Secondary | ICD-10-CM | POA: Diagnosis not present

## 2023-05-22 DIAGNOSIS — L97521 Non-pressure chronic ulcer of other part of left foot limited to breakdown of skin: Secondary | ICD-10-CM | POA: Diagnosis not present

## 2023-05-26 ENCOUNTER — Encounter: Payer: Self-pay | Admitting: Cardiology

## 2023-05-26 ENCOUNTER — Ambulatory Visit: Payer: Medicare Other | Attending: Cardiology | Admitting: Cardiology

## 2023-05-26 VITALS — BP 132/84 | HR 80 | Ht 71.0 in | Wt 211.8 lb

## 2023-05-26 DIAGNOSIS — I10 Essential (primary) hypertension: Secondary | ICD-10-CM

## 2023-05-26 DIAGNOSIS — I25110 Atherosclerotic heart disease of native coronary artery with unstable angina pectoris: Secondary | ICD-10-CM

## 2023-05-26 DIAGNOSIS — I25119 Atherosclerotic heart disease of native coronary artery with unspecified angina pectoris: Secondary | ICD-10-CM

## 2023-05-26 DIAGNOSIS — N1832 Chronic kidney disease, stage 3b: Secondary | ICD-10-CM | POA: Diagnosis not present

## 2023-05-26 DIAGNOSIS — I350 Nonrheumatic aortic (valve) stenosis: Secondary | ICD-10-CM

## 2023-05-26 DIAGNOSIS — E782 Mixed hyperlipidemia: Secondary | ICD-10-CM | POA: Diagnosis not present

## 2023-05-26 MED ORDER — METOPROLOL SUCCINATE ER 25 MG PO TB24: 25.0000 mg | ORAL_TABLET | Freq: Two times a day (BID) | ORAL | 1 refills | Status: DC

## 2023-05-26 NOTE — Progress Notes (Signed)
Cardiology Office Note  Date: 05/26/2023   ID: CAL Carl Young, DOB [birth year], MRN [id number]  History of Present Illness: Carl Young is an 82 y.o. male last seen in May by [name] NP, I reviewed the note.  He is here with his wife for a follow-up visit.  Reports no definite angina, has had some sternal soreness which sounds more like arthritic/inflammatory etiology (worse after sneezing or certain movements).  Has also been more fatigued than normal since beta-blocker was uptitrated.  I reviewed his medications.  Current cardiac regimen includes Norvasc, Lotensin, Tricor,Toprol-XL, and Crestor.  Blood pressure control is better today.  We discussed his most recent follow-up echocardiogram from October.  Plan on getting an updated study next year for follow-up of aortic stenosis.  I reviewed his ECG today which shows sinus rhythm.  Physical Exam: VS:  BP 132/84   Pulse 80   Ht 5\' 11"  (1.803 m)   Wt 211 lb 12.8 oz (96.1 kg)   SpO2 94%   BMI 29.54 kg/m , BMI Body mass index is 29.54 kg/m.  Wt Readings from Last 3 Encounters:  05/26/23 211 lb 12.8 oz (96.1 kg)  12/04/22 212 lb 3.2 oz (96.3 kg)  11/15/22 209 lb 9.6 oz (95.1 kg)    General: Patient appears comfortable at rest. HEENT: Conjunctiva and lids normal. Neck: Supple, no elevated JVP or carotid bruits. Lungs: Clear to auscultation, nonlabored breathing at rest. Cardiac: Regular rate and rhythm, no S3, 3/6 systolic murmur. Extremities: No pitting edema.  ECG:  An ECG dated 04/04/2022 was personally reviewed today and demonstrated:  Sinus rhythm with PAC, nonspecific ST changes.  Labwork: 12/20/2022: BUN 23; Creat 1.38; Potassium 4.5; Sodium 142  September 2024: Hemoglobin 15.2, platelets 184, BUN 24, creatinine 1.45, potassium 4.5, AST 33, ALT 28, cholesterol 136, glycerides 243, HDL 27, LDL 69, TSH 1.44  Other Studies Reviewed Today:  Echocardiogram 04/01/2023:  1. Left ventricular ejection fraction, by  estimation, is 60 to 65%. The  left ventricle has normal function. The left ventricle has no regional  wall motion abnormalities. There is mild concentric left ventricular  hypertrophy. Left ventricular diastolic  parameters are consistent with Grade I diastolic dysfunction (impaired  relaxation). Elevated left ventricular end-diastolic pressure.   2. Right ventricular systolic function is normal. The right ventricular  size is normal. Tricuspid regurgitation signal is inadequate for assessing  PA pressure.   3. The mitral valve is normal in structure. Trivial mitral valve  regurgitation. No evidence of mitral stenosis.   4. The aortic valve has an indeterminant number of cusps. There is severe  calcifcation of the aortic valve. Aortic valve regurgitation is mild.  Moderate aortic valve stenosis. Aortic regurgitation PHT measures 728  msec. Aortic valve area, by VTI  measures 0.95 cm. Aortic valve mean gradient measures 23.0 mmHg. Aortic  valve Vmax measures 3.15 m/s. DVI is 0.34.   5. Aortic dilatation noted. There is borderline dilatation of the aortic  root, measuring 37 mm. There is borderline dilatation of the ascending  aorta, measuring 37 mm.   6. The inferior vena cava is normal in size with greater than 50%  respiratory variability, suggesting right atrial pressure of 3 mmHg.   Assessment and Plan:  1.  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  LVEF 60 to 65% by echocardiogram in October of this year.  Plan to continue medical therapy including aspirin and Crestor.   2.  Degenerative calcific aortic stenosis.  Follow-up echocardiogram in October of this year revealed moderate aortic stenosis with mean AV gradient 23 mmHg and dimensionless index 0.34.  Update echocardiogram next year.  3.  Mixed hyperlipidemia.  LDL 69 in September of this year.  Continue Crestor.   4.  Primary  hypertension.  Blood pressure trend is better.  Reduce Toprol-XL to 25 mg twice daily to see if this helps with his fatigue.  Otherwise continue Norvasc and Lotensin at current doses.   5.  CKD stage IIIb.  Creatinine 1.45 in September of this year.  Disposition:  Follow up  6 months.  Signed, Carl Young, M.D., F.A.C.C. [facility] at [facility]

## 2023-05-26 NOTE — Patient Instructions (Addendum)
Medication Instructions:  Your physician has recommended you make the following change in your medication:  Decrease metoprolol succinate to 25 mg twice daily Continue all other medications as prescribed  Labwork: none  Testing/Procedures: none  Follow-Up: Your physician recommends that you schedule a follow-up appointment in: 6 months  Any Other Special Instructions Will Be Listed Below (If Applicable).  If you need a refill on your cardiac medications before your next appointment, please call your pharmacy.

## 2023-06-10 DIAGNOSIS — L97521 Non-pressure chronic ulcer of other part of left foot limited to breakdown of skin: Secondary | ICD-10-CM | POA: Diagnosis not present

## 2023-07-03 DIAGNOSIS — L97521 Non-pressure chronic ulcer of other part of left foot limited to breakdown of skin: Secondary | ICD-10-CM | POA: Diagnosis not present

## 2023-07-17 DIAGNOSIS — Z85828 Personal history of other malignant neoplasm of skin: Secondary | ICD-10-CM | POA: Diagnosis not present

## 2023-07-17 DIAGNOSIS — Z08 Encounter for follow-up examination after completed treatment for malignant neoplasm: Secondary | ICD-10-CM | POA: Diagnosis not present

## 2023-07-21 ENCOUNTER — Other Ambulatory Visit: Payer: Self-pay | Admitting: Cardiology

## 2023-07-24 ENCOUNTER — Other Ambulatory Visit: Payer: Self-pay | Admitting: Nurse Practitioner

## 2023-07-24 DIAGNOSIS — L97521 Non-pressure chronic ulcer of other part of left foot limited to breakdown of skin: Secondary | ICD-10-CM | POA: Diagnosis not present

## 2023-07-25 DIAGNOSIS — D692 Other nonthrombocytopenic purpura: Secondary | ICD-10-CM | POA: Diagnosis not present

## 2023-07-25 DIAGNOSIS — Z299 Encounter for prophylactic measures, unspecified: Secondary | ICD-10-CM | POA: Diagnosis not present

## 2023-07-25 DIAGNOSIS — I25119 Atherosclerotic heart disease of native coronary artery with unspecified angina pectoris: Secondary | ICD-10-CM | POA: Diagnosis not present

## 2023-07-25 DIAGNOSIS — I700 Atherosclerosis of aorta: Secondary | ICD-10-CM | POA: Diagnosis not present

## 2023-07-25 DIAGNOSIS — I10 Essential (primary) hypertension: Secondary | ICD-10-CM | POA: Diagnosis not present

## 2023-08-13 DIAGNOSIS — H353221 Exudative age-related macular degeneration, left eye, with active choroidal neovascularization: Secondary | ICD-10-CM | POA: Diagnosis not present

## 2023-08-14 DIAGNOSIS — L97521 Non-pressure chronic ulcer of other part of left foot limited to breakdown of skin: Secondary | ICD-10-CM | POA: Diagnosis not present

## 2023-08-26 ENCOUNTER — Emergency Department (HOSPITAL_COMMUNITY): Payer: Medicare Other

## 2023-08-26 ENCOUNTER — Inpatient Hospital Stay (HOSPITAL_COMMUNITY)
Admission: EM | Admit: 2023-08-26 | Discharge: 2023-08-29 | DRG: 066 | Disposition: A | Discharge status: Home Health | Payer: Medicare Other | Attending: Internal Medicine | Admitting: Internal Medicine

## 2023-08-26 ENCOUNTER — Encounter (HOSPITAL_COMMUNITY): Payer: Self-pay | Admitting: *Deleted

## 2023-08-26 ENCOUNTER — Other Ambulatory Visit: Payer: Self-pay

## 2023-08-26 DIAGNOSIS — Z7901 Long term (current) use of anticoagulants: Secondary | ICD-10-CM | POA: Diagnosis not present

## 2023-08-26 DIAGNOSIS — I129 Hypertensive chronic kidney disease with stage 1 through stage 4 chronic kidney disease, or unspecified chronic kidney disease: Secondary | ICD-10-CM | POA: Diagnosis present

## 2023-08-26 DIAGNOSIS — Z808 Family history of malignant neoplasm of other organs or systems: Secondary | ICD-10-CM | POA: Diagnosis not present

## 2023-08-26 DIAGNOSIS — I639 Cerebral infarction, unspecified: Secondary | ICD-10-CM | POA: Diagnosis not present

## 2023-08-26 DIAGNOSIS — E782 Mixed hyperlipidemia: Secondary | ICD-10-CM | POA: Diagnosis not present

## 2023-08-26 DIAGNOSIS — I6340 Cerebral infarction due to embolism of unspecified cerebral artery: Secondary | ICD-10-CM | POA: Diagnosis not present

## 2023-08-26 DIAGNOSIS — Z1152 Encounter for screening for COVID-19: Secondary | ICD-10-CM

## 2023-08-26 DIAGNOSIS — Z823 Family history of stroke: Secondary | ICD-10-CM

## 2023-08-26 DIAGNOSIS — I2510 Atherosclerotic heart disease of native coronary artery without angina pectoris: Secondary | ICD-10-CM | POA: Diagnosis present

## 2023-08-26 DIAGNOSIS — Z825 Family history of asthma and other chronic lower respiratory diseases: Secondary | ICD-10-CM | POA: Diagnosis not present

## 2023-08-26 DIAGNOSIS — Z881 Allergy status to other antibiotic agents status: Secondary | ICD-10-CM | POA: Diagnosis not present

## 2023-08-26 DIAGNOSIS — Z841 Family history of disorders of kidney and ureter: Secondary | ICD-10-CM

## 2023-08-26 DIAGNOSIS — Z7982 Long term (current) use of aspirin: Secondary | ICD-10-CM

## 2023-08-26 DIAGNOSIS — Z803 Family history of malignant neoplasm of breast: Secondary | ICD-10-CM | POA: Diagnosis not present

## 2023-08-26 DIAGNOSIS — I480 Paroxysmal atrial fibrillation: Secondary | ICD-10-CM | POA: Diagnosis not present

## 2023-08-26 DIAGNOSIS — R29700 NIHSS score 0: Secondary | ICD-10-CM | POA: Diagnosis present

## 2023-08-26 DIAGNOSIS — Z8249 Family history of ischemic heart disease and other diseases of the circulatory system: Secondary | ICD-10-CM

## 2023-08-26 DIAGNOSIS — I10 Essential (primary) hypertension: Secondary | ICD-10-CM | POA: Diagnosis present

## 2023-08-26 DIAGNOSIS — Z951 Presence of aortocoronary bypass graft: Secondary | ICD-10-CM | POA: Diagnosis not present

## 2023-08-26 DIAGNOSIS — I63441 Cerebral infarction due to embolism of right cerebellar artery: Secondary | ICD-10-CM | POA: Diagnosis not present

## 2023-08-26 DIAGNOSIS — N183 Chronic kidney disease, stage 3 unspecified: Secondary | ICD-10-CM | POA: Diagnosis not present

## 2023-08-26 DIAGNOSIS — I6782 Cerebral ischemia: Secondary | ICD-10-CM | POA: Diagnosis not present

## 2023-08-26 DIAGNOSIS — I4891 Unspecified atrial fibrillation: Secondary | ICD-10-CM | POA: Diagnosis present

## 2023-08-26 DIAGNOSIS — Z79899 Other long term (current) drug therapy: Secondary | ICD-10-CM | POA: Diagnosis not present

## 2023-08-26 DIAGNOSIS — Z87891 Personal history of nicotine dependence: Secondary | ICD-10-CM | POA: Diagnosis not present

## 2023-08-26 DIAGNOSIS — S0990XA Unspecified injury of head, initial encounter: Secondary | ICD-10-CM | POA: Diagnosis not present

## 2023-08-26 LAB — CBC WITH DIFFERENTIAL/PLATELET
Abs Immature Granulocytes: 0.01 10*3/uL (ref 0.00–0.07)
Basophils Absolute: 0 10*3/uL (ref 0.0–0.1)
Basophils Relative: 1 %
Eosinophils Absolute: 0.1 10*3/uL (ref 0.0–0.5)
Eosinophils Relative: 1 %
HCT: 45.6 % (ref 39.0–52.0)
Hemoglobin: 15 g/dL (ref 13.0–17.0)
Immature Granulocytes: 0 %
Lymphocytes Relative: 30 %
Lymphs Abs: 2 10*3/uL (ref 0.7–4.0)
MCH: 31.3 pg (ref 26.0–34.0)
MCHC: 32.9 g/dL (ref 30.0–36.0)
MCV: 95.2 fL (ref 80.0–100.0)
Monocytes Absolute: 0.7 10*3/uL (ref 0.1–1.0)
Monocytes Relative: 10 %
Neutro Abs: 3.8 10*3/uL (ref 1.7–7.7)
Neutrophils Relative %: 58 %
Platelets: 173 10*3/uL (ref 150–400)
RBC: 4.79 MIL/uL (ref 4.22–5.81)
RDW: 14.4 % (ref 11.5–15.5)
WBC: 6.6 10*3/uL (ref 4.0–10.5)
nRBC: 0 % (ref 0.0–0.2)

## 2023-08-26 LAB — URINALYSIS, ROUTINE W REFLEX MICROSCOPIC
Bacteria, UA: NONE SEEN
Bilirubin Urine: NEGATIVE
Glucose, UA: NEGATIVE mg/dL
Hgb urine dipstick: NEGATIVE
Ketones, ur: NEGATIVE mg/dL
Leukocytes,Ua: NEGATIVE
Nitrite: NEGATIVE
Protein, ur: 100 mg/dL — AB
Specific Gravity, Urine: 1.013 (ref 1.005–1.030)
pH: 7 (ref 5.0–8.0)

## 2023-08-26 LAB — BASIC METABOLIC PANEL
Anion gap: 13 (ref 5–15)
BUN: 23 mg/dL (ref 8–23)
CO2: 24 mmol/L (ref 22–32)
Calcium: 9.9 mg/dL (ref 8.9–10.3)
Chloride: 104 mmol/L (ref 98–111)
Creatinine, Ser: 1.21 mg/dL (ref 0.61–1.24)
GFR, Estimated: 59 mL/min — ABNORMAL LOW (ref >60)
Glucose, Bld: 90 mg/dL (ref 70–99)
Potassium: 4.1 mmol/L (ref 3.5–5.1)
Sodium: 141 mmol/L (ref 135–145)

## 2023-08-26 LAB — RESP PANEL BY RT-PCR (RSV, FLU A&B, COVID)  RVPGX2
Influenza A by PCR: NEGATIVE
Influenza B by PCR: NEGATIVE
Resp Syncytial Virus by PCR: NEGATIVE
SARS Coronavirus 2 by RT PCR: NEGATIVE

## 2023-08-26 MED ORDER — METOPROLOL SUCCINATE ER 25 MG PO TB24
25.0000 mg | ORAL_TABLET | Freq: Two times a day (BID) | ORAL | Status: DC
  Administered 2023-08-26 – 2023-08-29 (×6): 25 mg via ORAL
  Filled 2023-08-26 (×6): qty 1

## 2023-08-26 MED ORDER — ROSUVASTATIN CALCIUM 20 MG PO TABS
40.0000 mg | ORAL_TABLET | Freq: Every day | ORAL | Status: DC
  Administered 2023-08-26 – 2023-08-28 (×3): 40 mg via ORAL
  Filled 2023-08-26 (×3): qty 2

## 2023-08-26 MED ORDER — FOLIC ACID 1 MG PO TABS
0.5000 mg | ORAL_TABLET | Freq: Every day | ORAL | Status: DC
  Administered 2023-08-26 – 2023-08-28 (×3): 0.5 mg via ORAL
  Filled 2023-08-26 (×3): qty 1

## 2023-08-26 MED ORDER — ASPIRIN 81 MG PO TBEC
81.0000 mg | DELAYED_RELEASE_TABLET | Freq: Every day | ORAL | Status: DC
  Administered 2023-08-27 – 2023-08-29 (×3): 81 mg via ORAL
  Filled 2023-08-26 (×3): qty 1

## 2023-08-26 MED ORDER — SENNOSIDES-DOCUSATE SODIUM 8.6-50 MG PO TABS: 1.0000 | ORAL_TABLET | Freq: Every evening | ORAL | Status: DC | PRN

## 2023-08-26 MED ORDER — BENAZEPRIL HCL 20 MG PO TABS
40.0000 mg | ORAL_TABLET | Freq: Every day | ORAL | Status: DC
  Administered 2023-08-27 – 2023-08-29 (×3): 40 mg via ORAL
  Filled 2023-08-26 (×3): qty 2

## 2023-08-26 MED ORDER — FENOFIBRATE 160 MG PO TABS
160.0000 mg | ORAL_TABLET | Freq: Every day | ORAL | Status: DC
  Administered 2023-08-27 – 2023-08-29 (×3): 160 mg via ORAL
  Filled 2023-08-26 (×3): qty 1

## 2023-08-26 MED ORDER — MECLIZINE HCL 12.5 MG PO TABS
25.0000 mg | ORAL_TABLET | Freq: Once | ORAL | Status: AC
  Administered 2023-08-26: 25 mg via ORAL
  Filled 2023-08-26: qty 2

## 2023-08-26 MED ORDER — STROKE: EARLY STAGES OF RECOVERY BOOK
Freq: Once | Status: AC
  Filled 2023-08-26: qty 1

## 2023-08-26 MED ORDER — APIXABAN 5 MG PO TABS
5.0000 mg | ORAL_TABLET | Freq: Two times a day (BID) | ORAL | Status: DC
  Administered 2023-08-26 – 2023-08-28 (×4): 5 mg via ORAL
  Filled 2023-08-26 (×4): qty 1

## 2023-08-26 MED ORDER — AMLODIPINE BESYLATE 5 MG PO TABS
10.0000 mg | ORAL_TABLET | Freq: Every day | ORAL | Status: DC
  Administered 2023-08-27 – 2023-08-29 (×3): 10 mg via ORAL
  Filled 2023-08-26 (×3): qty 2

## 2023-08-26 MED ORDER — ACETAMINOPHEN 500 MG PO TABS: 500.0000 mg | ORAL_TABLET | Freq: Four times a day (QID) | ORAL | Status: DC | PRN

## 2023-08-26 MED ORDER — SODIUM CHLORIDE 0.9 % IV SOLN: INTRAVENOUS | Status: DC

## 2023-08-26 NOTE — Assessment & Plan Note (Signed)
 On admission. BP a little high. Continue present home medications  08-27-2023 allow for permissive hypertension for now.

## 2023-08-26 NOTE — Assessment & Plan Note (Addendum)
 Patient with h/o   transient a. 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 Dr. Selina Cooley reviewed MRI. MRA negative for LVO. Dr. Selina Cooley felt admission to [facility] reasonable - no need to transfer. High probability of embolic CVA although ASVD also possible.  Plan Stroke protocol observation admission to telemetry  Eliquis 5 mg bid  D/c ASA

## 2023-08-26 NOTE — Assessment & Plan Note (Addendum)
 Patient reports he "had some a fib around the time of his CABG but was not aware of being in a fib on a regular basis. He was taking ASA 81 mg daily as part of his cardiovascular regimen.  Plan Tele obs admit  Eliquis 5mg  bid

## 2023-08-26 NOTE — ED Provider Notes (Signed)
 [place] EMERGENCY DEPARTMENT AT [facility] Provider Note   CSN: [id number] Arrival date & time: 08/26/23  1354     History  Chief Complaint  Patient presents with   Carl Young    Carl Young is a 83 y.o. male.  This ER today feeling "funny", had a fall.  He reports for the past several days has been feeling like his head was full and slightly dizzy, today he woke up feeling like this as well, though at 1:00 approximately he tried to get up and states he was trying to walk straight and he was falling to the left.  He went to lay down for a little bit but still was not feeling better so he came to the ER for further evaluation.  Denies headache, numbness tingling or weakness, slurred speech, vision changes.  He states he did not fall to the ground, he was able to lower himself to the couch gently and then to the ground.  He is not on blood thinners, no history of CVA but does have history of CAD with prior CABG   Fall       Home Medications Prior to Admission medications   Medication Sig Start Date End Date Taking? Authorizing Provider  amLODipine (NORVASC) 10 MG tablet TAKE 1 TABLET BY MOUTH DAILY 07/24/23   Jonelle Sidle, MD  ascorbic acid (VITAMIN C) 1000 MG tablet Take 1,000 mg by mouth daily.     [provider]  ASPIRIN LOW DOSE 81 MG tablet TAKE 1 TABLET BY MOUTH DAILY, swallow whole 07/21/23   Jonelle Sidle, MD  benazepril (LOTENSIN) 40 MG tablet Take 40 mg by mouth daily.  06/05/17   [provider]  Cholecalciferol (VITAMIN D3) 50 MCG (2000 UT) capsule Take 2,000 Units by mouth every evening.     [provider]  fenofibrate (TRICOR) 145 MG tablet Take 145 mg by mouth every evening.     [provider]  fish oil-omega-3 fatty acids 1000 MG capsule Take 2 g by mouth at bedtime.    [provider]  folic acid (FOLVITE) 400 MCG tablet Take 400 mcg by mouth daily.     [provider]  Garlic 1000 MG CAPS  Take 1,000 mg by mouth at bedtime.     [provider]  Lysine 500 MG CAPS Take 500 mg by mouth daily.     [provider]  metoprolol succinate (TOPROL XL) 25 MG 24 hr tablet Take 1 tablet (25 mg total) by mouth 2 (two) times daily. 05/26/23   Jonelle Sidle, MD  Multiple Vitamin (MULTIVITAMIN WITH MINERALS) TABS Take 1 tablet by mouth daily.    [provider]  Multiple Vitamins-Minerals (PRESERVISION AREDS 2+MULTI VIT PO) Take 1 capsule by mouth 2 (two) times daily.    [provider]  rosuvastatin (CRESTOR) 40 MG tablet Take 1 tablet (40 mg total) by mouth daily. 06/18/20   Gershon Crane E, PA-C  zinc gluconate 50 MG tablet Take 50 mg by mouth every evening.     [provider]      Allergies    Ciprofloxacin in d5w    Review of Systems   Review of Systems  Physical Exam Updated Vital Signs BP (!) 158/91 (BP Location: Right Arm)   Pulse 87   Temp (!) 97.3 F (36.3 C) (Oral)   Resp 16   Ht 5\' 11"  (1.803 m)   Wt 93.9 kg   SpO2 96%  BMI 28.87 kg/m  Physical Exam Vitals and nursing note reviewed.  Constitutional:      General: He is not in acute distress.    Appearance: He is well-developed.  HENT:     Head: Normocephalic and atraumatic.     Mouth/Throat:     Mouth: Mucous membranes are moist.  Eyes:     General: No visual field deficit.    Conjunctiva/sclera: Conjunctivae normal.  Cardiovascular:     Rate and Rhythm: Normal rate and regular rhythm.     Heart sounds: No murmur heard. Pulmonary:     Effort: Pulmonary effort is normal. No respiratory distress.     Breath sounds: Normal breath sounds.  Abdominal:     Palpations: Abdomen is soft.     Tenderness: There is no abdominal tenderness.  Musculoskeletal:        General: No swelling.     Cervical back: Neck supple.  Skin:    General: Skin is warm and dry.     Capillary Refill: Capillary refill takes less than 2 seconds.  Neurological:     Mental Status: He is  alert and oriented to person, place, and time.     GCS: GCS eye subscore is 4. GCS verbal subscore is 5. GCS motor subscore is 6.     Cranial Nerves: No dysarthria or facial asymmetry.     Sensory: No sensory deficit.     Motor: No weakness, tremor, abnormal muscle tone or pronator drift.     Coordination: Finger-Nose-Finger Test and Heel to [employer] normal.  Psychiatric:        Mood and Affect: Mood normal.     ED Results / Procedures / Treatments   Labs (all labs ordered are listed, but only abnormal results are displayed) Labs Reviewed  BASIC METABOLIC PANEL - Abnormal; Notable for the following components:      Result Value   GFR, Estimated 59 (*)    All other components within normal limits  CBC WITH DIFFERENTIAL/PLATELET    EKG None  Radiology No results found.  Procedures Procedures    Medications Ordered in ED Medications - No data to display  ED Course/ Medical Decision Making/ A&P                                 Medical Decision Making Differential diagnosis includes but not limited to BPPV, CVA, bronchitis, other ED course: Patient had episode today of not being well to walk straight, around 1 PM, he has been having fullness in his head and dizziness since waking up this morning, states has been on and off for several days, he attributed this to possible head cold.  Denies fever or chills.  Denies any numbness tingling or weakness otherwise.  Not have any speech difficulties.  Patient was brought in with complaint of fall, upon evaluation it seems that he had some ataxia, CT head was normal, labs so far have been reassuring though waiting on urine and respiratory panel.  Concern for possible posterior circulation stroke so MRI is ordered at this time.  Signed out to Dr. Rhunette Croft.  At time of my evaluation patient had been symptomatic for over 5 hours and unsure if he was totally normal this morning so out of window for TNK.  He is VAN negative. Signed out to Dr.  Rhunette Croft  Amount and/or Complexity of Data Reviewed Labs: ordered. Radiology: ordered.  Final Clinical Impression(s) / ED Diagnoses Final diagnoses:  None    Rx / DC Orders ED Discharge Orders     None         Ma Rings, PA-C 08/26/23 1816    Derwood Kaplan, MD 08/26/23 2035

## 2023-08-26 NOTE — ED Notes (Signed)
Pt given sandwich and beverage.

## 2023-08-26 NOTE — H&P (Signed)
 History and Physical    Carl Young ZOX:[id number] DOB: 10/10/1940 DOA: 08/26/2023  DOS: the patient was seen and examined on 08/26/2023  PCP: Ignatius Specking, MD   Patient coming from: Home  I have personally briefly reviewed patient's old medical records in [facility]  Mr.Knepp an 83 y/o with h/o HTN, CAD s/p CABG reports for the past several days has been feeling like his head was full and slightly dizzy. today he woke up feeling like this as well but was abmulatory, able to walk down his driveway to the mailbox. At 1:00 approximately he tried to get up from his nap and states he was trying to walk straight and he was falling to the left. He did not fall per se but let himself down in front of the couch. His wife was able with some difficulty to get him up. He was brought to [facility] for evaluation. He denies an Chest pain, any focal weakness or loss of sensation, his speech remained clear, no report of facial droop, no SOB/DOE.    ED Course: afebrile, 161/81  HR 82  RR 17. General - patient seen while on a stretcher in the hallway of the ED. He is awake alert, cognating normally and his speech is clear. He has good recollection of the days events. Lab: Bmet - nl, CBCD - nl, U/A negative. CT head without acute injury or hemorrhage. 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 MRQ- no LVO. EDP consulted Dr. Selina Cooley who reviewed the MRI/MRA. The recommendation was to admit to [facility]. [facility] called to admit for routince stroke protocol.   Review of Systems:  Review of Systems  Constitutional: Negative.   HENT: Negative.    Eyes: Negative.   Respiratory: Negative.    Cardiovascular: Negative.   Gastrointestinal: Negative.   Genitourinary: Negative.   Musculoskeletal: Negative.   Skin: Negative.   Neurological:  Positive for dizziness. Negative for sensory change, speech change, focal weakness and loss of consciousness.       Balance problems - dysequilibrium   Endo/Heme/Allergies: Negative.   Psychiatric/Behavioral: Negative.      Past Medical History:  Diagnosis Date   Bell's palsy    CAD (coronary artery disease)    Multivessel status post CABG 05/2020 - LIMA to LAD, SVG to ramus intermedius, SVG to OM 2 and OM 3, and SVG to PDA   CKD (chronic kidney disease) stage 3, GFR 30-59 ml/min (HCC)    Essential hypertension    Helicobacter pylori (H. pylori) infection    History of GI bleed    Mixed hyperlipidemia    Nephrolithiasis    Postoperative atrial fibrillation [facility])     Past Surgical History:  Procedure Laterality Date   APPENDECTOMY     COLONOSCOPY  05/06/2012   Procedure: COLONOSCOPY;  Surgeon: Malissa Hippo, MD;  Location: [facility] ENDO [facility]ITE;  Service: Endoscopy;  Laterality: N/A;  730   COLONOSCOPY N/A 11/03/2019   Procedure: COLONOSCOPY;  Surgeon: Malissa Hippo, MD;  Location: [facility] ENDO SUITE;  Service: Endoscopy;  Laterality: N/A;  930-rescheduled 5/5 same time per office   CORONARY ARTERY BYPASS GRAFT N/A 06/12/2020   Procedure: CORONARY ARTERY BYPASS GRAFTING (CABG), ON PUMP, TIMES FIVE, USING LEFT INTERNAL MAMMARY ARTERY AND ENDOSCOPICALLY HARVESTED RIGHT GREATER SAPHENOUS VEIN;  Surgeon: Alleen Borne, MD;  Location: [facility] OR;  Service: Open Heart Surgery;  Laterality: N/A;   HAMMER TOE SURGERY Left 07/09/2017   Procedure: HAMMER TOE CORRECTION 2ND  DIGIT LEFT FOOT;  Surgeon: Ferman Hamming, DPM;  Location: [facility] ORS;  Service: Podiatry;  Laterality: Left;   LEFT HEART CATH AND CORONARY ANGIOGRAPHY N/A 06/09/2020   Procedure: LEFT HEART CATH AND CORONARY ANGIOGRAPHY;  Surgeon: Lennette Bihari, MD;  Location: [facility] INVASIVE CV LAB;  Service: Cardiovascular;  Laterality: N/A;   METATARSAL HEAD EXCISION Left 08/25/2019   Procedure: METATARSAL HEAD RESECTION OF THE 2ND AND 3RD METATARSAL LEFT FOOT;  Surgeon: Ferman Hamming, DPM;  Location: [facility] ORS;  Service: Podiatry;  Laterality: Left;   POLYPECTOMY  11/03/2019   Procedure: POLYPECTOMY;   Surgeon: Malissa Hippo, MD;  Location: [facility] ENDO [facility]ITE;  Service: Endoscopy;;   TEE WITHOUT CARDIOVERSION N/A 06/12/2020   Procedure: TRANSESOPHAGEAL ECHOCARDIOGRAM (TEE);  Surgeon: Alleen Borne, MD;  Location: [facility] OR;  Service: Open Heart Surgery;  Laterality: N/A;   WEIL OSTEOTOMY Left 07/09/2017   Procedure: WEIL OSTEOTOMY 2ND METATARSAL LEFT FOOT;  Surgeon: Ferman Hamming, DPM;  Location: [facility] ORS;  Service: Podiatry;  Laterality: Left;    Soc Hx - married 57 years. Lives with his wife. Children and extended family nearby   reports that he quit smoking about 38 years ago. His smoking use included cigarettes and cigars. He has never been exposed to tobacco smoke. He has never used smokeless tobacco. He reports current alcohol use. He reports that he does not use drugs.  Allergies  Allergen Reactions   Ciprofloxacin In D5w Rash and Other (See Comments)    Pruritis     Family History  Problem Relation Age of Onset   COPD Mother    Heart disease Father    Stroke Father    Breast cancer Sister    Melanoma Brother    Kidney disease Maternal Grandmother     Prior to Admission medications   Medication Sig Start Date End Date Taking? Authorizing Provider  acetaminophen (TYLENOL) 500 MG tablet Take 500 mg by mouth every 6 (six) hours as needed for mild pain (pain score 1-3).   Yes [provider]  amLODipine (NORVASC) 10 MG tablet TAKE 1 TABLET BY MOUTH DAILY 07/24/23  Yes Jonelle Sidle, MD  ascorbic acid (VITAMIN C) 1000 MG tablet Take 1,000 mg by mouth daily.    Yes [provider]  ASPIRIN LOW DOSE 81 MG tablet TAKE 1 TABLET BY MOUTH DAILY, swallow whole 07/21/23  Yes Jonelle Sidle, MD  benazepril (LOTENSIN) 40 MG tablet Take 40 mg by mouth daily.  06/05/17  Yes [provider]  carboxymethylcellulose (REFRESH PLUS) 0.5 % SOLN Place 1 drop into both eyes daily as needed (dry eyes).   Yes [provider]  Cholecalciferol (VITAMIN D3) 50 MCG  (2000 UT) capsule Take 2,000 Units by mouth every evening.    Yes [provider]  fenofibrate (TRICOR) 145 MG tablet Take 145 mg by mouth every evening.    Yes [provider]  fish oil-omega-3 fatty acids 1000 MG capsule Take 1 g by mouth in the morning and at bedtime.   Yes [provider]  folic acid (FOLVITE) 400 MCG tablet Take 400 mcg by mouth daily.    Yes [provider]  Garlic 1000 MG CAPS Take 1,000 mg by mouth at bedtime.    Yes [provider]  Lysine 500 MG CAPS Take 500 mg by mouth daily.    Yes [provider]  metoprolol succinate (TOPROL-XL) 50 MG 24 hr tablet Take 25 mg by mouth 2 (two) times daily.  08/01/23  Yes [provider]  Misc Natural Products (BEET ROOT PO) Take 1 capsule by mouth daily.   Yes [provider]  Multiple Vitamin (MULTIVITAMIN WITH MINERALS) TABS Take 1 tablet by mouth daily.   Yes [provider]  Multiple Vitamins-Minerals (PRESERVISION AREDS 2+MULTI VIT PO) Take 1 capsule by mouth 2 (two) times daily.   Yes [provider]  rosuvastatin (CRESTOR) 40 MG tablet Take 1 tablet (40 mg total) by mouth daily. 06/18/20  Yes Gold, Deniece Portela E, PA-C  zinc gluconate 50 MG tablet Take 50 mg by mouth every evening.    Yes [provider]    Physical Exam: Vitals:   08/26/23 1419 08/26/23 1422 08/26/23 2030  BP:  (!) 158/91 (!) 161/81  Pulse:  87 82  Resp:  16 17  Temp:  (!) 97.3 F (36.3 C) 98.2 F (36.8 C)  TempSrc:  Oral Oral  SpO2:  96% 94%  Weight: 93.9 kg    Height: 5\' 11"  (1.803 m)      Physical Exam Vitals and nursing note reviewed.  Constitutional:      General: He is not in acute distress.    Appearance: Normal appearance. He is obese. He is not ill-appearing.  HENT:     Head: Normocephalic and atraumatic.     Mouth/Throat:     Mouth: Mucous membranes are dry.     Pharynx: Oropharynx is clear. No oropharyngeal exudate.  Eyes:     Extraocular  Movements: Extraocular movements intact.     Conjunctiva/sclera: Conjunctivae normal.     Pupils: Pupils are equal, round, and reactive to light.  Cardiovascular:     Rate and Rhythm: Normal rate. Rhythm irregular.     Pulses: Normal pulses.     Heart sounds: Murmur heard.     Comments: Soft II/VI systolic mm best at RSB Pulmonary:     Effort: Pulmonary effort is normal. No respiratory distress.     Breath sounds: Normal breath sounds.  Abdominal:     General: Bowel sounds are normal.     Palpations: Abdomen is soft.  Musculoskeletal:        General: Normal range of motion.     Cervical back: Normal range of motion and neck supple. No rigidity.     Right lower leg: No edema.     Left lower leg: No edema.  Skin:    General: Skin is warm and dry.  Neurological:     Mental Status: He is alert and oriented to person, place, and time.     Comments: CN II-XII - nl, no facial droop, PERRLA,EOMI, nl shoulder shrug MS - 5/5 thruout DTRs - 2+ symmetrical Sensation in tact to light touch Cerebellar - no tremor, was unable to stand and ambulate  Psychiatric:        Mood and Affect: Mood normal.        Behavior: Behavior normal.      Labs on Admission: I have personally reviewed following labs and imaging studies  CBC: Recent Labs  Lab 08/26/23 1456  WBC 6.6  NEUTROABS 3.8  HGB 15.0  HCT 45.6  MCV 95.2  PLT 173   Basic Metabolic Panel: Recent Labs  Lab 08/26/23 1456  NA 141  K 4.1  CL 104  CO2 24  GLUCOSE 90  BUN 23  CREATININE 1.21  CALCIUM 9.9   GFR: Estimated Creatinine Clearance: 52.2 mL/min (by C-G formula based on SCr of 1.21 mg/dL). Liver Function Tests: No  results for input(s): "AST", "ALT", "ALKPHOS", "BILITOT", "PROT", "ALBUMIN" in the last 168 hours. No results for input(s): "LIPASE", "AMYLASE" in the last 168 hours. No results for input(s): "AMMONIA" in the last 168 hours. Coagulation Profile: No results for input(s): "INR", "PROTIME" in the last 168  hours. Cardiac Enzymes: No results for input(s): "CKTOTAL", "CKMB", "CKMBINDEX", "TROPONINI" in the last 168 hours. BNP (last 3 results) No results for input(s): "PROBNP" in the last 8760 hours. HbA1C: No results for input(s): "HGBA1C" in the last 72 hours. CBG: No results for input(s): "GLUCAP" in the last 168 hours. Lipid Profile: No results for input(s): "CHOL", "HDL", "LDLCALC", "TRIG", "CHOLHDL", "LDLDIRECT" in the last 72 hours. Thyroid Function Tests: No results for input(s): "TSH", "T4TOTAL", "FREET4", "T3FREE", "THYROIDAB" in the last 72 hours. Anemia Panel: No results for input(s): "VITAMINB12", "FOLATE", "FERRITIN", "TIBC", "IRON", "RETICCTPCT" in the last 72 hours. Urine analysis:    Component Value Date/Time   COLORURINE YELLOW 08/26/2023 1856   APPEARANCEUR CLEAR 08/26/2023 1856   LABSPEC 1.013 08/26/2023 1856   PHURINE 7.0 08/26/2023 1856   GLUCOSEU NEGATIVE 08/26/2023 1856   HGBUR NEGATIVE 08/26/2023 1856   BILIRUBINUR NEGATIVE 08/26/2023 1856   KETONESUR NEGATIVE 08/26/2023 1856   PROTEINUR 100 (A) 08/26/2023 1856   UROBILINOGEN 0.2 03/27/2015 1152   NITRITE NEGATIVE 08/26/2023 1856   LEUKOCYTESUR NEGATIVE 08/26/2023 1856    Radiological Exams on Admission: I have personally reviewed images CT Head Wo Contrast Result Date: 08/26/2023 CLINICAL DATA:  Head trauma. EXAM: CT HEAD WITHOUT CONTRAST TECHNIQUE: Contiguous axial images were obtained from the base of the skull through the vertex without intravenous contrast. RADIATION DOSE REDUCTION: This exam was performed according to the departmental dose-optimization program which includes automated exposure control, adjustment of the mA and/or kV according to patient size and/or use of iterative reconstruction technique. COMPARISON:  None Available. FINDINGS: Brain: Mild age-related atrophy and chronic microvascular ischemic changes. There is no acute intracranial hemorrhage. No mass effect or midline shift. No  extra-axial fluid collection. Vascular: No hyperdense vessel or unexpected calcification. Skull: Normal. Negative for fracture or focal lesion. Sinuses/Orbits: No acute finding. Other: None IMPRESSION: 1. No acute intracranial pathology. 2. Mild age-related atrophy and chronic microvascular ischemic changes. Electronically Signed   By: Elgie Collard M.D.   On: 08/26/2023 17:43    EKG: I have personally reviewed EKG: Sinus Rhytm with PACs  no acute changes.  Assessment/Plan Principal Problem:   CVA (cerebral vascular accident) [facility]) Active Problems:   Essential hypertension   Atrial fibrillation (HCC)    Assessment and Plan: * CVA (cerebral vascular accident) [facility]) Patient with h/o   transient a. 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 Dr. Selina Cooley reviewed MRI. MRA negative for LVO. Dr. Selina Cooley felt admission to [facility] reasonable - no need to transfer. High probability of embolic CVA although ASVD also possible.  Plan Stroke protocol observation admission to telemetry  Eliquis 5 mg bid  D/c ASA  Atrial fibrillation [facility]) Patient reports he "had some a fib around the time of his CABG but was not aware of being in a fib on a regular basis. He was taking ASA 81 mg daily as part of his cardiovascular regimen.  Plan Tele obs admit  Eliquis 5mg  bid  Essential hypertension BP a little high.   Plan Continue present home medications       DVT prophylaxis: Eliquis Code Status: Full Code Family Communication: wife present  during evaluation  Disposition Plan: home when stable  Consults called: Neuro - Dr. Selina Cooley and associates  Admission status: Observation, Telemetry bed   Illene Regulus, MD [facility] 08/26/2023, 11:38 PM

## 2023-08-26 NOTE — ED Notes (Signed)
 Admitting physician at bedside. Dr Ranell Patrick

## 2023-08-26 NOTE — Subjective & Objective (Addendum)
 Pt seen and examined. He felt fine before he went outside to get his mail. Walked about 70-80 yards(each way). And then went back inside to take a nap. When he woke up from his nap, he could not walk without feeling like he was going to fall down.  No vision changes.  MRI brain shows "Patchy small volume acute to early subacute right PCA distribution infarcts  involving the right temporoccipital junction and right thalamus"  Neurology consulted and felt pt could be kept at [facility].

## 2023-08-26 NOTE — ED Triage Notes (Signed)
 Pt states he was sitting in his chair and he states he got up but his feet did not want to work and he "controlled" himself on the floor  Pt states he woke up this am with his head not feeling right   Pt states he feels out of balance

## 2023-08-27 ENCOUNTER — Telehealth: Payer: Self-pay | Admitting: *Deleted

## 2023-08-27 ENCOUNTER — Observation Stay: Payer: Medicare Other

## 2023-08-27 ENCOUNTER — Observation Stay (HOSPITAL_BASED_OUTPATIENT_CLINIC_OR_DEPARTMENT_OTHER): Payer: Medicare Other

## 2023-08-27 ENCOUNTER — Telehealth: Payer: Self-pay | Admitting: Physician Assistant

## 2023-08-27 DIAGNOSIS — Z79899 Other long term (current) drug therapy: Secondary | ICD-10-CM | POA: Diagnosis not present

## 2023-08-27 DIAGNOSIS — I63441 Cerebral infarction due to embolism of right cerebellar artery: Secondary | ICD-10-CM | POA: Diagnosis not present

## 2023-08-27 DIAGNOSIS — I10 Essential (primary) hypertension: Secondary | ICD-10-CM | POA: Diagnosis not present

## 2023-08-27 DIAGNOSIS — Z87891 Personal history of nicotine dependence: Secondary | ICD-10-CM | POA: Diagnosis not present

## 2023-08-27 DIAGNOSIS — I639 Cerebral infarction, unspecified: Secondary | ICD-10-CM | POA: Diagnosis present

## 2023-08-27 DIAGNOSIS — Z881 Allergy status to other antibiotic agents status: Secondary | ICD-10-CM | POA: Diagnosis not present

## 2023-08-27 DIAGNOSIS — Z7982 Long term (current) use of aspirin: Secondary | ICD-10-CM | POA: Diagnosis not present

## 2023-08-27 DIAGNOSIS — Z808 Family history of malignant neoplasm of other organs or systems: Secondary | ICD-10-CM | POA: Diagnosis not present

## 2023-08-27 DIAGNOSIS — I6340 Cerebral infarction due to embolism of unspecified cerebral artery: Secondary | ICD-10-CM | POA: Diagnosis present

## 2023-08-27 DIAGNOSIS — Z8249 Family history of ischemic heart disease and other diseases of the circulatory system: Secondary | ICD-10-CM | POA: Diagnosis not present

## 2023-08-27 DIAGNOSIS — I129 Hypertensive chronic kidney disease with stage 1 through stage 4 chronic kidney disease, or unspecified chronic kidney disease: Secondary | ICD-10-CM | POA: Diagnosis present

## 2023-08-27 DIAGNOSIS — Z803 Family history of malignant neoplasm of breast: Secondary | ICD-10-CM | POA: Diagnosis not present

## 2023-08-27 DIAGNOSIS — E782 Mixed hyperlipidemia: Secondary | ICD-10-CM | POA: Diagnosis present

## 2023-08-27 DIAGNOSIS — Z951 Presence of aortocoronary bypass graft: Secondary | ICD-10-CM | POA: Diagnosis not present

## 2023-08-27 DIAGNOSIS — I6389 Other cerebral infarction: Secondary | ICD-10-CM | POA: Diagnosis not present

## 2023-08-27 DIAGNOSIS — I480 Paroxysmal atrial fibrillation: Secondary | ICD-10-CM | POA: Diagnosis not present

## 2023-08-27 DIAGNOSIS — Z7901 Long term (current) use of anticoagulants: Secondary | ICD-10-CM | POA: Diagnosis not present

## 2023-08-27 DIAGNOSIS — N183 Chronic kidney disease, stage 3 unspecified: Secondary | ICD-10-CM | POA: Diagnosis present

## 2023-08-27 DIAGNOSIS — Z1152 Encounter for screening for COVID-19: Secondary | ICD-10-CM | POA: Diagnosis not present

## 2023-08-27 DIAGNOSIS — I2510 Atherosclerotic heart disease of native coronary artery without angina pectoris: Secondary | ICD-10-CM | POA: Diagnosis present

## 2023-08-27 DIAGNOSIS — Z841 Family history of disorders of kidney and ureter: Secondary | ICD-10-CM | POA: Diagnosis not present

## 2023-08-27 DIAGNOSIS — R29700 NIHSS score 0: Secondary | ICD-10-CM | POA: Diagnosis present

## 2023-08-27 DIAGNOSIS — Z823 Family history of stroke: Secondary | ICD-10-CM | POA: Diagnosis not present

## 2023-08-27 DIAGNOSIS — Z825 Family history of asthma and other chronic lower respiratory diseases: Secondary | ICD-10-CM | POA: Diagnosis not present

## 2023-08-27 DIAGNOSIS — I6523 Occlusion and stenosis of bilateral carotid arteries: Secondary | ICD-10-CM | POA: Diagnosis not present

## 2023-08-27 LAB — LIPID PANEL
Cholesterol: 135 mg/dL (ref 0–200)
HDL: 23 mg/dL — ABNORMAL LOW (ref >40)
LDL Cholesterol: 36 mg/dL (ref 0–99)
Total CHOL/HDL Ratio: 5.9 {ratio}
Triglycerides: 378 mg/dL — ABNORMAL HIGH (ref <150)
VLDL: 76 mg/dL — ABNORMAL HIGH (ref 0–40)

## 2023-08-27 LAB — ECHOCARDIOGRAM COMPLETE
AR max vel: 1 cm2
AV Area VTI: 0.93 cm2
AV Area mean vel: 1 cm2
AV Mean grad: 16.4 mm[Hg]
AV Peak grad: 28.2 mm[Hg]
Ao pk vel: 2.65 m/s
Area-P 1/2: 4.12 cm2
Calc EF: 64.1 %
Height: 71 in
MV VTI: 3.66 cm2
S' Lateral: 2.8 cm
Single Plane A2C EF: 63.1 %
Single Plane A4C EF: 63.2 %
Weight: 3312 [oz_av]

## 2023-08-27 LAB — HEMOGLOBIN A1C
Hgb A1c MFr Bld: 6.2 % — ABNORMAL HIGH (ref 4.8–5.6)
Mean Plasma Glucose: 131 mg/dL

## 2023-08-27 NOTE — Evaluation (Signed)
 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   Coronary artery disease involving native coronary artery of native heart with unstable angina pectoris (HCC) 06/09/2020   Abnormal stress test 06/06/2020   Hx of colonic polyps 06/21/2019   Noninfectious gastroenteritis and colitis 03/28/2015   Essential hypertension 03/28/2015   High cholesterol 03/28/2015   History of colonic polyps 03/28/2015   Past Medical History:  Past Medical History:  Diagnosis Date   Bell's palsy    CAD (coronary artery disease)    Multivessel status post CABG 05/2020 - LIMA to LAD, SVG to ramus intermedius, SVG to OM 2 and OM 3, and SVG to PDA   CKD (chronic kidney disease) stage 3, GFR 30-59 ml/min (HCC)    Essential hypertension    Helicobacter pylori (H. pylori) infection    History of GI bleed    Mixed hyperlipidemia    Nephrolithiasis    Postoperative atrial fibrillation (HCC)    Past Surgical History:  Past Surgical History:  Procedure Laterality Date   APPENDECTOMY     COLONOSCOPY  05/06/2012   Procedure: COLONOSCOPY;  Surgeon: Malissa Hippo, MD;  [facility];  Service: Endoscopy;  Laterality: N/A;  730   COLONOSCOPY N/A 11/03/2019   Procedure: COLONOSCOPY;  Surgeon: Malissa Hippo, MD;  [facility];  Service: Endoscopy;  Laterality: N/A;  930-rescheduled 5/5 same time per office   CORONARY ARTERY BYPASS GRAFT N/A 06/12/2020   Procedure: CORONARY ARTERY BYPASS GRAFTING (CABG), ON PUMP, TIMES FIVE, USING LEFT INTERNAL MAMMARY ARTERY AND ENDOSCOPICALLY HARVESTED RIGHT GREATER SAPHENOUS VEIN;  Surgeon: Alleen Borne, MD;   Location: [facility];  Service: Open Heart Surgery;  Laterality: N/A;   HAMMER TOE SURGERY Left 07/09/2017   Procedure: HAMMER TOE CORRECTION 2ND DIGIT LEFT FOOT;  Surgeon: Ferman Hamming, DPM;  Location: [facility];  Service: Podiatry;  Laterality: Left;   LEFT HEART CATH AND CORONARY ANGIOGRAPHY N/A 06/09/2020   Procedure: LEFT HEART CATH AND CORONARY ANGIOGRAPHY;  Surgeon: Lennette Bihari, MD;  Location: [facility];  Service: Cardiovascular;  Laterality: N/A;   METATARSAL HEAD EXCISION Left 08/25/2019   Procedure: METATARSAL HEAD RESECTION OF THE 2ND AND 3RD METATARSAL LEFT FOOT;  Surgeon: Ferman Hamming, DPM;  Location: [facility];  Service: Podiatry;  Laterality: Left;   POLYPECTOMY  11/03/2019   Procedure: POLYPECTOMY;  Surgeon: Malissa Hippo, MD;  [facility];  Service: Endoscopy;;   TEE WITHOUT CARDIOVERSION N/A 06/12/2020   Procedure: TRANSESOPHAGEAL ECHOCARDIOGRAM (TEE);  Surgeon: Alleen Borne, MD;  Location[facility] OR;  Service: Open Heart Surgery;  Laterality: N/A;   WEIL OSTEOTOMY Left 07/09/2017   Procedure: WEIL OSTEOTOMY 2ND METATARSAL LEFT FOOT;  Surgeon: Ferman Hamming, DPM;  Location: [facility];  Service: Podiatry;  Laterality: Left;   HPI:  Pt seen and examined. He felt fine before he went outside to get his mail. Walked about 70-80 yards(each way). And then went back inside to take a nap. When he woke up from his nap, he could not walk without feeling like he was going to fall down.  No vision changes.     MRI brain shows "  Patchy small volume acute to early subacute right PCA distribution infarcts  involving the right temporoccipital junction and right thalamus"   Assessment / Plan / Recommendation Clinical Impression  Pt's cognitive linguistic skills appear to be at baseline and also confirmed by Pt and his spouse.    SLP Assessment  SLP Recommendation/Assessment: All further Speech Lanaguage Pathology  needs can be addressed in the next venue of care SLP Visit  Diagnosis: Other (comment) (dysphonia, may need ENT consult)    Recommendations for follow up therapy are one component of a multi-disciplinary discharge planning process, led by the attending physician.  Recommendations may be updated based on patient status, additional functional criteria and insurance authorization.    Follow Up Recommendations  Follow physician's recommendations for discharge plan and follow up therapies    Assistance Recommended at Discharge  None  Functional Status Assessment Patient has not had a recent decline in their functional status  Frequency and Duration           SLP Evaluation Cognition  Overall Cognitive Status: Within Functional Limits for tasks assessed Arousal/Alertness: Awake/alert Orientation Level: Oriented X4 Year: 2025 Month: February Day of Week: Correct Memory: Appears intact Awareness: Appears intact Problem Solving: Appears intact Safety/Judgment: Appears intact       Comprehension  Auditory Comprehension Overall Auditory Comprehension: Appears within functional limits for tasks assessed Yes/No Questions: Within Functional Limits Commands: Within Functional Limits Conversation: Complex Visual Recognition/Discrimination Discrimination: Within Function Limits Reading Comprehension Reading Status: Within funtional limits    Expression Expression Primary Mode of Expression: Verbal Verbal Expression Overall Verbal Expression: Appears within functional limits for tasks assessed Initiation: No impairment Level of Generative/Spontaneous Verbalization: Conversation Repetition: No impairment Naming: No impairment Pragmatics: No impairment Non-Verbal Means of Communication: Not applicable   Oral / Motor  Oral Motor/Sensory Function Overall Oral Motor/Sensory Function: Within functional limits Motor Speech Overall Motor Speech: Appears within functional limits for tasks assessed Respiration: Impaired Level of Impairment:  Sentence Phonation: Hoarse Resonance: Within functional limits Articulation: Within functional limitis Intelligibility: Intelligible Motor Planning: Witnin functional limits Motor Speech Errors: Not applicable           Thank you,  Havery Moros, CCC-SLP [phone]  Bode Pieper 08/27/2023, 1:28 PM

## 2023-08-27 NOTE — Telephone Encounter (Signed)
 Request 14 Day Zio by Dr. Imogene Burn for CVA. Order placed.

## 2023-08-27 NOTE — Plan of Care (Signed)
  Problem: Acute Rehab OT Goals (only OT should resolve) Goal: Pt. Will Perform Grooming Flowsheets (Taken 08/27/2023 0931) Pt Will Perform Grooming: with modified independence Goal: Pt. Will Perform Lower Body Dressing Flowsheets (Taken 08/27/2023 0931) Pt Will Perform Lower Body Dressing: with modified independence Goal: Pt. Will Transfer To Toilet Flowsheets (Taken 08/27/2023 [phone]) Pt Will Transfer to Toilet: with modified independence Goal: Pt. Will Perform Toileting-Clothing Manipulation Flowsheets (Taken 08/27/2023 0931) Pt Will Perform Toileting - Clothing Manipulation and hygiene: with modified independence Goal: Pt/Caregiver Will Perform Home Exercise Program Flowsheets (Taken 08/27/2023 [phone]) Pt/caregiver will Perform Home Exercise Program:  Increased strength  Right Upper extremity  Independently  Shauntell Iglesia OT, MOT

## 2023-08-27 NOTE — Plan of Care (Signed)

## 2023-08-27 NOTE — Telephone Encounter (Signed)
 14 day Zio requested by Dr. Imogene Burn for acute CVA, will route to [facility] staff to help arrange to mail to pt's house. OK for non-live per Dr. Diona Browner. I added FYI to AVS to expect this in the mail.

## 2023-08-27 NOTE — ED Notes (Signed)
 Pt spouse given recliner, blanket and pillow

## 2023-08-27 NOTE — TOC CM/SW Note (Signed)
 Transition of Care [facility]) - Inpatient Brief Assessment   Patient Details  Name: Carl Young MRN: [id number] Date of Birth: 18-May-1941  Transition of Care [facility] CM/SW Contact:    Villa Herb, LCSWA Phone Number: 08/27/2023, 9:07 AM   Clinical Narrative: Transition of Care Department [facility]) has reviewed patient and no TOC needs have been identified at this time. We will continue to monitor patient advancement through interdiciplinary progression rounds. If new patient transition needs arise, please place a TOC consult.   Transition of Care Asessment: Insurance and Status: Insurance coverage has been reviewed Patient has primary care physician: Yes Home environment has been reviewed: From home Prior level of function:: Independent Prior/Current Home Services: No current home services Social Drivers of Health Review: SDOH reviewed no interventions necessary Readmission risk has been reviewed: Yes Transition of care needs: no transition of care needs at this time

## 2023-08-27 NOTE — Progress Notes (Signed)
 PT Plan of Care Note  Patient Details Name: Carl Young MRN: [id number] DOB: May 30, 1941   POC: PT and OT changing discharge recommendations to CIR to maximize returns for functional gains. See evaluation note for details, however, pt CGA<> min assist for all mobility with 75ft of ambulation with RW.  Attending in agreement with change in recommendations. Pt is on priority treatment list and will be seen regularly.   Nelida Meuse PT, DPT [facility] Outpatient Rehabilitation- [facility] [phone] office      Nelida Meuse 08/27/2023, 12:47 PM

## 2023-08-27 NOTE — Hospital Course (Signed)
 HPI: Carl Young an 83 y/o with h/o HTN, CAD s/p CABG reports for the past several days has been feeling like his head was full and slightly dizzy. today he woke up feeling like this as well but was abmulatory, able to walk down his driveway to the mailbox. At 1:00 approximately he tried to get up from his nap and states he was trying to walk straight and he was falling to the left. He did not fall per se but let himself down in front of the couch. His wife was able with some difficulty to get him up. [facility] was brought to [facility] for evaluation. He denies an Chest pain, any focal weakness or loss of sensation, his speech remained clear, no report of facial droop, no SOB/DOE.     ED Course: afebrile, 161/81  HR 82  RR 17. General - patient seen while on a stretcher in the hallway of the ED. He is awake alert, cognating normally and his speech is clear. He has good recollection of the days events. Lab: Bmet - nl, CBCD - nl, U/A negative. CT head without acute injury or hemorrhage. 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 MRQ- no LVO. EDP consulted Dr. Selina Cooley who reviewed the MRI/MRA. The recommendation was to admit to [facility]. TRH called to admit for routince stroke protocol.   Significant Events: Admitted 08/26/2023 for acute CVA   Significant Labs: WBC 6.6, HgB 15, Plt 173 Na 141, K 4.1, CO2 of 24, BUN 23, Scr 1.21, glu 90 TC 135, TG 378, LDL 36, HDL 23 HgA1c   Significant Imaging Studies: CT head No acute intracranial pathology. 2. Mild age-related atrophy and chronic microvascular ischemic changes. MRI brain Patchy small volume acute to early subacute right PCA distribution infarcts involving the right temporoccipital junction and right thalamus. No associated hemorrhage or mass effect. 2. Underlying moderate chronic microvascular ischemic disease.  Antibiotic Therapy: Anti-infectives (From admission, onward)    None       Procedures:   Consultants:

## 2023-08-27 NOTE — Progress Notes (Signed)
   Inpatient Rehab Admissions Coordinator :  Per therapy change in recommendations, patient was screened for CIR candidacy by Ottie Glazier RN MSN.  At this time patient appears to be a potential candidate for CIR. I will place a rehab consult per protocol for full assessment. Please call me with any questions.  Ottie Glazier RN MSN Admissions Coordinator [phone]

## 2023-08-27 NOTE — Evaluation (Signed)
 Physical Therapy Evaluation Patient Details Name: Carl Young MRN: [id number] DOB: 14-Aug-1940 Today's Date: 08/27/2023  History of Present Illness  Mr.Hirata an 83 y/o with h/o HTN, CAD s/p CABG reports for the past several days has been feeling like his head was full and slightly dizzy. today he woke up feeling like this as well but was abmulatory, able to walk down his driveway to the mailbox. At 1:00 approximately he tried to get up from his nap and states he was trying to walk straight and he was falling to the left. He did not fall per se but let himself down in front of the couch. His wife was able with some difficulty to get him up. He was brought to [facility] for evaluation. He denies an Chest pain, any focal weakness or loss of sensation, his speech remained clear, no report of facial droop, no SOB/DOE. (per MD on last H&P note)  Clinical Impression   Pt tolerated today's co-evaluation, well with great return for mobility. At baseline, pt independent with PRN use of walking stick. Currently, Pt demonstrating limitations in transfers, ambulation and ADLS with CGA<> min assist for transfers and ambulation with use RW due to muscle weakness, motor planning deficits, balance deficits in setting of acute CVA. Pt with mild unsteadiness and reduced motor planning but shows good carryover with cues. At EOS, pt performed step pivot transfer with wife assisting for power to stand and steadying when turning to recliner with no concerns and limited assist. Pt is active and shows great potential for return of PLOF when outpatient rehabilitation for consistent skilled care.. Based upon these deficits/impairments, patient will benefit from continued skilled physical therapy services during remainder of hospital stay and at the next recommended venue of care to address deficits and promote return to optimal function.                If plan is discharge home, recommend the following: A little help with  walking and/or transfers;A little help with bathing/dressing/bathroom   Can travel by private vehicle        Equipment Recommendations Rolling walker (2 wheels)  Recommendations for Other Services       Functional Status Assessment Patient has had a recent decline in their functional status and demonstrates the ability to make significant improvements in function in a reasonable and predictable amount of time.     Precautions / Restrictions Precautions Precautions: Fall Restrictions Weight Bearing Restrictions Per Provider Order: No      Mobility  Bed Mobility Overal bed mobility: Needs Assistance Bed Mobility: Supine to Sit     Supine to sit: Supervision     General bed mobility comments: mild labored movement Patient Response: Cooperative  Transfers Overall transfer level: Needs assistance Equipment used: Rolling walker (2 wheels) Transfers: Sit to/from Stand, Bed to chair/wheelchair/BSC Sit to Stand: Contact guard assist, Min assist   Step pivot transfers: Contact guard assist, Min assist       General transfer comment: Multiple sit/stand transfers with min assist and progressing to CGA with tactile cues and verbal cues for proper setup. unsteady with SPC, needing use of RW; wife assist for step pivot to chair with CGA to min A at EOS for edcuation and testing capacity to manage at home.    Ambulation/Gait Ambulation/Gait assistance: Contact guard assist, Min assist Gait Distance (Feet): 55 Feet Assistive device: Rolling walker (2 wheels) Gait Pattern/deviations: WFL(Within Functional Limits), Step-through pattern, Decreased step length - right, Decreased stance time -  right, Decreased weight shift to right       General Gait Details: 54ft tested with SPC @ min assist to due lateral balancing deficits. 55ft with RW at [facility] with limited swing of RLE. Slow, long turning, safe and steady with RW.  Stairs            Wheelchair Mobility     Tilt Bed Tilt  Bed Patient Response: Cooperative  Modified Rankin (Stroke Patients Only)       Balance Overall balance assessment: Needs assistance Sitting-balance support: Feet supported, No upper extremity supported Sitting balance-Leahy Scale: Good Sitting balance - Comments: seated at EOB   Standing balance support: Bilateral upper extremity supported, During functional activity, Reliant on assistive device for balance Standing balance-Leahy Scale: Fair Standing balance comment: using RW                             Pertinent Vitals/Pain Pain Assessment Pain Assessment: No/denies pain    Home Living Family/patient expects to be discharged to:: Private residence Living Arrangements: Spouse/significant other Available Help at Discharge: Family;Available 24 hours/day Type of Home: House Home Access: Stairs to enter Entrance Stairs-Rails: Left Entrance Stairs-Number of Steps: 2 steps from den to kitchen with L side hand hold. Alternate Level Stairs-Number of Steps: 13 Home Layout: Two level;Laundry or work area in basement;Able to live on main level with bedroom/bathroom Home Equipment: Agricultural consultant (2 wheels);Cane - single point      Prior Function Prior Level of Function : Independent/Modified Independent             Mobility Comments: Community ambulator with use of SPC most of the time. ADLs Comments: Independent.     Extremity/Trunk Assessment   Upper Extremity Assessment Upper Extremity Assessment: Defer to OT evaluation RUE Deficits / Details: 4/5 whoulder flexion and abduction. 5/5 otherwise. WFL A/ROM. RUE Sensation: WNL RUE Coordination: WNL    Lower Extremity Assessment Lower Extremity Assessment: RLE deficits/detail;LLE deficits/detail RLE Deficits / Details: 4-/5 knee extension and ankle DF RLE Sensation: WNL RLE Coordination: WNL LLE Deficits / Details: 4+/5 knee extension and ankle DF. LLE Sensation: WNL LLE Coordination: WNL    Cervical /  Trunk Assessment Cervical / Trunk Assessment: Normal  Communication   Communication Communication: No apparent difficulties    Cognition Arousal: Alert Behavior During Therapy: WFL for tasks assessed/performed   PT - Cognitive impairments: No apparent impairments                         Following commands: Intact       Cueing Cueing Techniques: Verbal cues     General Comments      Exercises     Assessment/Plan    PT Assessment Patient needs continued PT services  PT Problem List Decreased strength;Decreased activity tolerance;Decreased balance;Decreased mobility       PT Treatment Interventions DME instruction;Gait training;Stair training;Functional mobility training;Therapeutic activities;Therapeutic exercise;Balance training;Neuromuscular re-education    PT Goals (Current goals can be found in the Care Plan section)  Acute Rehab PT Goals Patient Stated Goal: return home and get better PT Goal Formulation: With patient Time For Goal Achievement: 09/10/23 Potential to Achieve Goals: Good    Frequency Min 4X/week     Co-evaluation PT/OT/SLP Co-Evaluation/Treatment: Yes Reason for Co-Treatment: To address functional/ADL transfers PT goals addressed during session: Mobility/safety with mobility OT goals addressed during session: ADL's and self-care  AM-PAC PT "6 Clicks" Mobility  Outcome Measure Help needed turning from your back to your side while in a flat bed without using bedrails?: A Little Help needed moving from lying on your back to sitting on the side of a flat bed without using bedrails?: A Little Help needed moving to and from a bed to a chair (including a wheelchair)?: A Little Help needed standing up from a chair using your arms (e.g., wheelchair or bedside chair)?: A Little Help needed to walk in hospital room?: A Little Help needed climbing 3-5 steps with a railing? : A Lot 6 Click Score: 17    End of Session Equipment  Utilized During Treatment: Gait belt Activity Tolerance: Patient tolerated treatment well Patient left: in chair;with call bell/phone within reach Nurse Communication: Mobility status PT Visit Diagnosis: Unsteadiness on feet (R26.81);Other abnormalities of gait and mobility (R26.89);Muscle weakness (generalized) (M62.81)    Time: 0981-1914 PT Time Calculation (min) (ACUTE ONLY): 38 min   Charges:   PT Evaluation $PT Eval Low Complexity: 1 Low PT Treatments $Therapeutic Activity: 8-22 mins PT General Charges $$ ACUTE PT VISIT: 1 Visit         Elie Goody, DPT [facility] Outpatient Rehabilitation- [place] [address] [phone] office  Nelida Meuse 08/27/2023, 9:35 AM

## 2023-08-27 NOTE — ED Notes (Signed)
 Pt O2 drop to 89% hovering around 90 while pt sleeping, pt placed on 2L oxygen while sleeping to maintain O2 sats above 90%

## 2023-08-27 NOTE — Progress Notes (Signed)
  Echocardiogram 2D Echocardiogram has been performed.  Ocie Doyne RDCS 08/27/2023, 12:26 PM

## 2023-08-27 NOTE — Plan of Care (Signed)
  Problem: Acute Rehab PT Goals(only PT should resolve) Goal: Patient Will Transfer Sit To/From Stand Flowsheets (Taken 08/27/2023 [phone]) Patient will transfer sit to/from stand: Independently Goal: Pt Will Perform Standing Balance Or Pre-Gait Flowsheets (Taken 08/27/2023 0939) Pt will perform standing balance or pre-gait: Independently Goal: Pt Will Ambulate Flowsheets (Taken 08/27/2023 0939) Pt will Ambulate:  > 125 feet  with modified independence  with least restrictive assistive device Goal: Pt Will Go Up/Down Stairs Flowsheets (Taken 08/27/2023 0939) Pt will Go Up / Down Stairs:  3-5 stairs  with modified independence  Elie Goody, DPT [facility] Outpatient Rehabilitation- [place] [phone] office

## 2023-08-27 NOTE — Evaluation (Addendum)
 Occupational Therapy Evaluation Patient Details Name: Carl Young MRN: [id number] DOB: [birth year] Today's Date: 08/27/2023   History of Present Illness   Carl Young an 83 y/o with h/o HTN, CAD s/p CABG reports for the past several days has been feeling like his head was full and slightly dizzy. today he woke up feeling like this as well but was abmulatory, able to walk down his driveway to the mailbox. At 1:00 approximately he tried to get up from his nap and states he was trying to walk straight and he was falling to the left. He did not fall per se but let himself down in front of the couch. His wife was able with some difficulty to get him up. He was brought to [facility] for evaluation. He denies an Chest pain, any focal weakness or loss of sensation, his speech remained clear, no report of facial droop, no SOB/DOE. (per MD)     Clinical Impressions Pt agreeable to OT and PT co-evaluation. Pt uses SPC most of the time at baseline. Pt is independent for ADL's at baseline. Today pt required min to CGA for sit to stand and ambulation with use of RW; unsteady with use of can in brief attempt at ambulation. Pt demonstrates mild R UE shoulder weakness with not other UE deficits noted. Mild R directional nystagmus noted during visual tracking. Pt left in the chair with call bell within reach. Pt will benefit from continued OT in the hospital and recommended venue below to increase strength, balance, and endurance for safe ADL's.         If plan is discharge home, recommend the following:   A little help with walking and/or transfers;A little help with bathing/dressing/bathroom;Assist for transportation;Help with stairs or ramp for entrance     Functional Status Assessment   Patient has had a recent decline in their functional status and demonstrates the ability to make significant improvements in function in a reasonable and predictable amount of time.     Equipment Recommendations    None recommended by OT             Precautions/Restrictions   Precautions Precautions: Fall Restrictions Weight Bearing Restrictions Per Provider Order: No     Mobility Bed Mobility Overal bed mobility: Needs Assistance Bed Mobility: Supine to Sit     Supine to sit: Supervision     General bed mobility comments: mild labored movement    Transfers Overall transfer level: Needs assistance Equipment used: Rolling walker (2 wheels) Transfers: Sit to/from Stand, Bed to chair/wheelchair/BSC Sit to Stand: Contact guard assist, Min assist     Step pivot transfers: Contact guard assist, Min assist     General transfer comment: unsteady needing use of RW; wife assist for step pivot to chair with CGA to min A.      Balance Overall balance assessment: Needs assistance Sitting-balance support: Feet supported, No upper extremity supported Sitting balance-Leahy Scale: Good Sitting balance - Comments: seated at EOB   Standing balance support: Bilateral upper extremity supported, During functional activity, Reliant on assistive device for balance Standing balance-Leahy Scale: Fair Standing balance comment: using RW                           ADL either performed or assessed with clinical judgement   ADL Overall ADL's : Needs assistance/impaired   Feeding: independent    Grooming: Contact guard assist;Standing   Upper Body Bathing: Set up;Sitting   Lower Body  Bathing: Supervison/ safety;Sitting/lateral leans;Set up   Upper Body Dressing : Set up;Sitting   Lower Body Dressing: Set up;Sitting/lateral leans Lower Body Dressing Details (indicate cue type and reason): able to don shoes seated at EOB Toilet Transfer: Contact guard assist;Rolling walker (2 wheels);Minimal assistance Toilet Transfer Details (indicate cue type and reason): EOB to toilet with RW Toileting- Clothing Manipulation and Hygiene: Set up;Sitting/lateral lean  Tub/shower transfer- min A  with RW     Functional mobility during ADLs: Contact guard assist;Minimal assistance;Rolling walker (2 wheels) General ADL Comments: Able to ambulate ~50 feet in the hall and room with RW     Vision Baseline Vision/History: 1 Wears glasses;4 Cataracts Ability to See in Adequate Light: 1 Impaired Patient Visual Report: No change from baseline Vision Assessment?: Mild R direction nystagmus noted during horizontal tracking.  Additional Comments: baseline deficits     Perception Perception: Not tested       Praxis Praxis: Not tested       Pertinent Vitals/Pain Pain Assessment Pain Assessment: No/denies pain     Extremity/Trunk Assessment Upper Extremity Assessment Upper Extremity Assessment: Right hand dominant;RUE deficits/detail RUE Deficits / Details: 4/5 whoulder flexion and abduction. 5/5 otherwise. WFL A/ROM. RUE Sensation: WNL RUE Coordination: WNL   Lower Extremity Assessment Lower Extremity Assessment: Defer to PT evaluation   Cervical / Trunk Assessment Cervical / Trunk Assessment: Normal   Communication Communication Communication: No apparent difficulties   Cognition Arousal: Alert Behavior During Therapy: WFL for tasks assessed/performed Cognition: No apparent impairments                               Following commands: Intact       Cueing  General Comments   Cueing Techniques: Verbal cues                 Home Living Family/patient expects to be discharged to:: Private residence Living Arrangements: Spouse/significant other Available Help at Discharge: Family;Available 24 hours/day Type of Home: House Home Access: Stairs to enter [employer] of Steps: 2 steps from den to kitchen with L side hand hold. Entrance Stairs-Rails: Left Home Layout: Two level;Laundry or work area in basement;Able to live on main level with bedroom/bathroom Alternate Teacher, music of Steps: 13 Alternate Level Stairs-Rails:  Right;Left;Can reach both Bathroom Shower/Tub: Chief Strategy Officer: Standard Bathroom Accessibility: Yes How Accessible: Accessible via wheelchair;Accessible via walker Home Equipment: Agricultural consultant (2 wheels);Cane - single point          Prior Functioning/Environment Prior Level of Function : Independent/Modified Independent             Mobility Comments: Community ambulator with use of SPC most of the time. ADLs Comments: Independent.    OT Problem List: Decreased strength;Decreased activity tolerance;Impaired balance (sitting and/or standing)   OT Treatment/Interventions: Self-care/ADL training;Therapeutic exercise;Neuromuscular education;DME and/or AE instruction;Therapeutic activities;Patient/family education;Balance training      OT Goals(Current goals can be found in the care plan section)   Acute Rehab OT Goals Patient Stated Goal: return home OT Goal Formulation: With patient/family Time For Goal Achievement: 09/10/23 Potential to Achieve Goals: Good   OT Frequency:  Min 2X/week    Co-evaluation PT/OT/SLP Co-Evaluation/Treatment: Yes Reason for Co-Treatment: To address functional/ADL transfers   OT goals addressed during session: ADL's and self-care      AM-PAC OT "6 Clicks" Daily Activity     Outcome Measure Help from another person eating meals?: None  Help from another person taking care of personal grooming?: A Little Help from another person toileting, which includes using toliet, bedpan, or urinal?: A Little Help from another person bathing (including washing, rinsing, drying)?: A Little Help from another person to put on and taking off regular upper body clothing?: A Little Help from another person to put on and taking off regular lower body clothing?: A Little 6 Click Score: 19   End of Session Equipment Utilized During Treatment: Rolling walker (2 wheels);Gait belt Nurse Communication: Mobility status  Activity Tolerance:  Patient tolerated treatment well Patient left: in chair;with call bell/phone within reach  OT Visit Diagnosis: Unsteadiness on feet (R26.81);Other abnormalities of gait and mobility (R26.89);Muscle weakness (generalized) (M62.81);Other symptoms and signs involving the nervous system (Z61.096)                Time: 0454-0981 OT Time Calculation (min): 38 min Charges:  OT General Charges $OT Visit: 1 Visit OT Evaluation $OT Eval Low Complexity: 1 Low  Raidon Swanner OT, MOT   Danie Chandler 08/27/2023, 9:28 AM

## 2023-08-27 NOTE — Progress Notes (Addendum)
 PROGRESS NOTE    Carl Young  WGN:[id number] DOB: 1940/07/06 DOA: 08/26/2023 PCP: Ignatius Specking, MD  Subjective: Pt seen and examined. He felt fine before he went outside to get his mail. Walked about 70-80 yards(each way). And then went back inside to take a nap. When he woke up from his nap, he could not walk without feeling like he was going to fall down.  No vision changes.  MRI brain shows "Patchy small volume acute to early subacute right PCA distribution infarcts  involving the right temporoccipital junction and right thalamus"  Neurology consulted and felt pt could be kept at [facility].   Hospital Course: HPI: Carl Young an 83 y/o with h/o HTN, CAD s/p CABG reports for the past several days has been feeling like his head was full and slightly dizzy. today he woke up feeling like this as well but was abmulatory, able to walk down his driveway to the mailbox. At 1:00 approximately he tried to get up from his nap and states he was trying to walk straight and he was falling to the left. He did not fall per se but let himself down in front of the couch. His wife was able with some difficulty to get him up. He was brought to [facility] for evaluation. He denies an Chest pain, any focal weakness or loss of sensation, his speech remained clear, no report of facial droop, no SOB/DOE.     ED Course: afebrile, 161/81  HR 82  RR 17. General - patient seen while on a stretcher in the hallway of the ED. He is awake alert, cognating normally and his speech is clear. He has good recollection of the days events. Lab: Bmet - nl, CBCD - nl, U/A negative. CT head without acute injury or hemorrhage. 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 MRQ- no LVO. EDP consulted Dr. Selina Cooley who reviewed the MRI/MRA. The recommendation was to admit to [facility]. [facility] called to admit for routince stroke protocol.   Significant Events: Admitted 08/26/2023 for acute CVA   Significant Labs: WBC 6.6, HgB 15, Plt 173 Na  141, K 4.1, CO2 of 24, BUN 23, Scr 1.21, glu 90 TC 135, TG 378, LDL 36, HDL 23 HgA1c   Significant Imaging Studies: CT head No acute intracranial pathology. 2. Mild age-related atrophy and chronic microvascular ischemic changes. MRI brain Patchy small volume acute to early subacute right PCA distribution infarcts involving the right temporoccipital junction and right thalamus. No associated hemorrhage or mass effect. 2. Underlying moderate chronic microvascular ischemic disease.  Antibiotic Therapy: Anti-infectives (From admission, onward)    None       Procedures:   Consultants:     Assessment and Plan: * CVA (cerebral vascular accident) (HCC) On admission. Patient with h/o   transient a. 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 Dr. Selina Cooley reviewed MRI. MRA negative for LVO. Dr. Selina Cooley felt admission to [facility] reasonable - no need to transfer. High probability of embolic CVA although ASVD also possible. Plan. Stroke protocol observation admission to telemetry. Eliquis 5 mg bid.   08-27-2023 have reached out to cardiology to arrange for 14 day ziopatch(non-live) as outpatient. Continue with Eliquis. Check echo. He will need walker. Outpatient PT/OT. Pt states he feels like his legs are very heavy with walking. He and wife do not have any children. He was still driving a car and performing ADLs prior to  admission. Continue ASA due to hx of CABG. On crestor and fenofibrate.  PAF (paroxysmal atrial fibrillation) (HCC) Patient reports he "had some a fib around the time of his CABG but was not aware of being in a fib on a regular basis. He was taking ASA 81 mg daily as part of his cardiovascular regimen.  Plan Tele obs admit  Eliquis 5mg  bid  Essential hypertension On admission. BP a little high. Continue present home medications  08-27-2023 allow for permissive hypertension for now.   DVT  prophylaxis:  apixaban (ELIQUIS) tablet 5 mg     Code Status: Full Code Family Communication: no family at bedside. He is decisional. Disposition Plan: return home Reason for continuing need for hospitalization: awaiting echo. Arranging outpatient ziopatch. Continue with PT/OT.  Objective: Vitals:   08/27/23 0231 08/27/23 0634 08/27/23 0931 08/27/23 1212  BP: 137/80 (!) 136/106 135/67 (!) 150/69  Pulse: 84 68 87 76  Resp: 18   17  Temp: 97.8 F (36.6 C) 98 F (36.7 C)  97.8 F (36.6 C)  TempSrc:      SpO2: 94% 96%  97%  Weight:      Height:        Intake/Output Summary (Last 24 hours) at 08/27/2023 1238 Last data filed at 08/27/2023 1100 Gross per 24 hour  Intake 399.03 ml  Output 600 ml  Net -200.97 ml   Filed Weights   08/26/23 1419  Weight: 93.9 kg    Examination:  Physical Exam Vitals and nursing note reviewed.  Constitutional:      General: He is not in acute distress.    Appearance: He is not toxic-appearing or diaphoretic.  HENT:     Head: Normocephalic and atraumatic.     Nose: Nose normal.  Eyes:     General: No scleral icterus. Cardiovascular:     Rate and Rhythm: Normal rate and regular rhythm.  Pulmonary:     Effort: Pulmonary effort is normal.     Breath sounds: Normal breath sounds.  Abdominal:     General: Abdomen is flat. Bowel sounds are normal. There is no distension.     Palpations: Abdomen is soft.  Musculoskeletal:     Right lower leg: No edema.     Left lower leg: No edema.  Skin:    General: Skin is warm and dry.     Capillary Refill: Capillary refill takes less than 2 seconds.  Neurological:     General: No focal deficit present.     Mental Status: He is alert and oriented to person, place, and time.    Data Reviewed: I have personally reviewed following labs and imaging studies  CBC: Recent Labs  Lab 08/26/23 1456  WBC 6.6  NEUTROABS 3.8  HGB 15.0  HCT 45.6  MCV 95.2  PLT 173   Basic Metabolic Panel: Recent Labs   Lab 08/26/23 1456  NA 141  K 4.1  CL 104  CO2 24  GLUCOSE 90  BUN 23  CREATININE 1.21  CALCIUM 9.9   GFR: Estimated Creatinine Clearance: 52.2 mL/min (by C-G formula based on SCr of 1.21 mg/dL). Lipid Profile: Recent Labs    08/27/23 0357  CHOL 135  HDL 23*  LDLCALC 36  TRIG 161*  CHOLHDL 5.9   Recent Results (from the past 240 hours)  Resp panel by RT-PCR (RSV, Flu A&B, Covid) Anterior Nasal Swab     Status: None   Collection Time: 08/26/23  6:56 PM   Specimen: Anterior Nasal  Swab  Result Value Ref Range Status   SARS Coronavirus 2 by RT PCR NEGATIVE NEGATIVE Final    Comment: (NOTE) SARS-CoV-2 target nucleic acids are NOT DETECTED.  The SARS-CoV-2 RNA is generally detectable in upper respiratory specimens during the acute phase of infection. The lowest concentration of SARS-CoV-2 viral copies this assay can detect is 138 copies/mL. A negative result does not preclude SARS-Cov-2 infection and should not be used as the sole basis for treatment or other patient management decisions. A negative result may occur with  improper specimen collection/handling, submission of specimen other than nasopharyngeal swab, presence of viral mutation(s) within the areas targeted by this assay, and inadequate number of viral copies(<138 copies/mL). A negative result must be combined with clinical observations, patient history, and epidemiological information. The expected result is Negative.  Fact Sheet for Patients:  BloggerCourse.com  Fact Sheet for Healthcare Providers:  SeriousBroker.it  This test is no t yet approved or cleared by the [place] FDA and  has been authorized for detection and/or diagnosis of SARS-CoV-2 by FDA under an Emergency Use Authorization (EUA). This EUA will remain  in effect (meaning this test can be used) for the duration of the COVID-19 declaration under Section 564(b)(1) of the Act,  21 U.S.C.section 360bbb-3(b)(1), unless the authorization is terminated  or revoked sooner.       Influenza A by PCR NEGATIVE NEGATIVE Final   Influenza B by PCR NEGATIVE NEGATIVE Final    Comment: (NOTE) The Xpert Xpress SARS-CoV-2/FLU/RSV plus assay is intended as an aid in the diagnosis of influenza from Nasopharyngeal swab specimens and should not be used as a sole basis for treatment. Nasal washings and aspirates are unacceptable for Xpert Xpress SARS-CoV-2/FLU/RSV testing.  Fact Sheet for Patients: BloggerCourse.com  Fact Sheet for Healthcare Providers: SeriousBroker.it  This test is not yet approved or cleared by the [place] FDA and has been authorized for detection and/or diagnosis of SARS-CoV-2 by FDA under an Emergency Use Authorization (EUA). This EUA will remain in effect (meaning this test can be used) for the duration of the COVID-19 declaration under Section 564(b)(1) of the Act, 21 U.S.C. section 360bbb-3(b)(1), unless the authorization is terminated or revoked.     Resp Syncytial Virus by PCR NEGATIVE NEGATIVE Final    Comment: (NOTE) Fact Sheet for Patients: BloggerCourse.com  Fact Sheet for Healthcare Providers: SeriousBroker.it  This test is not yet approved or cleared by the [place] FDA and has been authorized for detection and/or diagnosis of SARS-CoV-2 by FDA under an Emergency Use Authorization (EUA). This EUA will remain in effect (meaning this test can be used) for the duration of the COVID-19 declaration under Section 564(b)(1) of the Act, 21 U.S.C. section 360bbb-3(b)(1), unless the authorization is terminated or revoked.  Performed at [facility], [address], [place], [place] [address]      Radiology Studies: MR BRAIN WO CONTRAST Result Date: 08/26/2023 CLINICAL DATA:  Initial evaluation for acute neuro deficit, stroke  suspected. EXAM: MRI HEAD WITHOUT CONTRAST MRA HEAD WITHOUT CONTRAST TECHNIQUE: Multiplanar, multi-echo pulse sequences of the brain and surrounding structures were acquired without intravenous contrast. Angiographic images of the Circle of Willis were acquired using MRA technique without intravenous contrast. COMPARISON:  CT from earlier the same day. FINDINGS: MRI HEAD FINDINGS Brain: Cerebral volume within normal limits. Patchy T2/FLAIR hyperintensity involving the periventricular deep white matter both cerebral hemispheres, consistent with chronic small vessel ischemic disease, moderate in nature. Patchy foci of restricted diffusion involving the right  temporal occipital junction and right thalamus, consistent with a small acute to early subacute right PCA distribution infarct (series 6, images 19, 22). No associated hemorrhage or mass effect. No other evidence for acute or subacute ischemia. No acute intracranial hemorrhage. Few chronic micro hemorrhages noted at the left frontal and right temporal lobes. No mass lesion, midline shift or mass effect. No hydrocephalus or extra-axial fluid collection. Pituitary gland within normal limits. Vascular: Major intracranial vascular flow voids are maintained. Skull and upper cervical spine: Craniocervical junction within normal limits. Bone marrow signal intensity normal. No scalp soft tissue abnormality. Sinuses/Orbits: Prior bilateral ocular lens replacement. Paranasal sinuses are clear. No significant mastoid effusion. Other: None. MRA HEAD FINDINGS Anterior circulation: Examination mildly degraded by motion artifact. Both internal carotid arteries are patent through the siphons without hemodynamically significant stenosis. A1 segments patent bilaterally. Normal anterior communicating artery complex. Evaluation of the proximal ACAs mildly limited by motion. Visualized ACAs patent to their distal aspects without convincing stenosis. No significant M1 stenosis. No  proximal MCA branch occlusion or high-grade stenosis. Distal MCA branches perfused and fairly symmetric. Posterior circulation: Visualized distal V4 segments patent without stenosis. Both PICA patent. Basilar patent without significant stenosis. Superior cerebral arteries patent bilaterally. Right PCA supplied via the basilar. Left PCA supplied via a hypoplastic left P1 segment and robust left posterior communicating artery. Both PCAs patent to their distal aspects without significant stenosis. Anatomic variants: As above.  No aneurysm. IMPRESSION: MRI HEAD: 1. Patchy small volume acute to early subacute right PCA distribution infarcts involving the right temporoccipital junction and right thalamus. No associated hemorrhage or mass effect. 2. Underlying moderate chronic microvascular ischemic disease. MRA HEAD: Negative intracranial MRA for large vessel occlusion. No hemodynamically significant or correctable stenosis. Electronically Signed   By: Rise Mu M.D.   On: 08/26/2023 19:04   MR ANGIO HEAD WO CONTRAST Result Date: 08/26/2023 CLINICAL DATA:  Initial evaluation for acute neuro deficit, stroke suspected. EXAM: MRI HEAD WITHOUT CONTRAST MRA HEAD WITHOUT CONTRAST TECHNIQUE: Multiplanar, multi-echo pulse sequences of the brain and surrounding structures were acquired without intravenous contrast. Angiographic images of the Circle of Willis were acquired using MRA technique without intravenous contrast. COMPARISON:  CT from earlier the same day. FINDINGS: MRI HEAD FINDINGS Brain: Cerebral volume within normal limits. Patchy T2/FLAIR hyperintensity involving the periventricular deep white matter both cerebral hemispheres, consistent with chronic small vessel ischemic disease, moderate in nature. Patchy foci of restricted diffusion involving the right temporal occipital junction and right thalamus, consistent with a small acute to early subacute right PCA distribution infarct (series 6, images 19,  22). No associated hemorrhage or mass effect. No other evidence for acute or subacute ischemia. No acute intracranial hemorrhage. Few chronic micro hemorrhages noted at the left frontal and right temporal lobes. No mass lesion, midline shift or mass effect. No hydrocephalus or extra-axial fluid collection. Pituitary gland within normal limits. Vascular: Major intracranial vascular flow voids are maintained. Skull and upper cervical spine: Craniocervical junction within normal limits. Bone marrow signal intensity normal. No scalp soft tissue abnormality. Sinuses/Orbits: Prior bilateral ocular lens replacement. Paranasal sinuses are clear. No significant mastoid effusion. Other: None. MRA HEAD FINDINGS Anterior circulation: Examination mildly degraded by motion artifact. Both internal carotid arteries are patent through the siphons without hemodynamically significant stenosis. A1 segments patent bilaterally. Normal anterior communicating artery complex. Evaluation of the proximal ACAs mildly limited by motion. Visualized ACAs patent to their distal aspects without convincing stenosis. No significant M1 stenosis. No proximal MCA  branch occlusion or high-grade stenosis. Distal MCA branches perfused and fairly symmetric. Posterior circulation: Visualized distal V4 segments patent without stenosis. Both PICA patent. Basilar patent without significant stenosis. Superior cerebral arteries patent bilaterally. Right PCA supplied via the basilar. Left PCA supplied via a hypoplastic left P1 segment and robust left posterior communicating artery. Both PCAs patent to their distal aspects without significant stenosis. Anatomic variants: As above.  No aneurysm. IMPRESSION: MRI HEAD: 1. Patchy small volume acute to early subacute right PCA distribution infarcts involving the right temporoccipital junction and right thalamus. No associated hemorrhage or mass effect. 2. Underlying moderate chronic microvascular ischemic disease. MRA  HEAD: Negative intracranial MRA for large vessel occlusion. No hemodynamically significant or correctable stenosis. Electronically Signed   By: Rise Mu M.D.   On: 08/26/2023 19:04   CT Head Wo Contrast Result Date: 08/26/2023 CLINICAL DATA:  Head trauma. EXAM: CT HEAD WITHOUT CONTRAST TECHNIQUE: Contiguous axial images were obtained from the base of the skull through the vertex without intravenous contrast. RADIATION DOSE REDUCTION: This exam was performed according to the departmental dose-optimization program which includes automated exposure control, adjustment of the mA and/or kV according to patient size and/or use of iterative reconstruction technique. COMPARISON:  None Available. FINDINGS: Brain: Mild age-related atrophy and chronic microvascular ischemic changes. There is no acute intracranial hemorrhage. No mass effect or midline shift. No extra-axial fluid collection. Vascular: No hyperdense vessel or unexpected calcification. Skull: Normal. Negative for fracture or focal lesion. Sinuses/Orbits: No acute finding. Other: None IMPRESSION: 1. No acute intracranial pathology. 2. Mild age-related atrophy and chronic microvascular ischemic changes. Electronically Signed   By: Elgie Collard M.D.   On: 08/26/2023 17:43    Scheduled Meds:   stroke: early stages of recovery book   Does not apply Once   amLODipine  10 mg Oral Daily   apixaban  5 mg Oral BID   aspirin EC  81 mg Oral Daily   benazepril  40 mg Oral Daily   fenofibrate  160 mg Oral Daily   folic acid  0.5 mg Oral QHS   metoprolol succinate  25 mg Oral BID   rosuvastatin  40 mg Oral QHS   Continuous Infusions:   LOS: 0 days   Time spent: 40 minutes  Carollee Herter, DO  [facility]  08/27/2023, 12:38 PM

## 2023-08-28 ENCOUNTER — Inpatient Hospital Stay (HOSPITAL_COMMUNITY): Payer: Medicare Other

## 2023-08-28 DIAGNOSIS — I639 Cerebral infarction, unspecified: Secondary | ICD-10-CM | POA: Diagnosis not present

## 2023-08-28 DIAGNOSIS — I63441 Cerebral infarction due to embolism of right cerebellar artery: Secondary | ICD-10-CM | POA: Diagnosis not present

## 2023-08-28 MED ORDER — APIXABAN 5 MG PO TABS: 5.0000 mg | ORAL_TABLET | Freq: Two times a day (BID) | ORAL | 2 refills | Status: DC

## 2023-08-28 MED ORDER — FENOFIBRATE 160 MG PO TABS: 160.0000 mg | ORAL_TABLET | Freq: Every day | ORAL | 2 refills | Status: DC

## 2023-08-28 NOTE — Consult Note (Addendum)
 I connected with  Carl Young on 08/28/23 by a video enabled telemedicine application and verified that I am speaking with the correct person using two identifiers.   I discussed the limitations of evaluation and management by telemedicine. The patient expressed understanding and agreed to proceed.  Location of patient: [facility] Location of physician: [facility]   Neurology Consultation Reason for Consult: Stroke Referring Physician: Dr. Maurilio Lovely  CC: Balance issues  History is obtained from: Patient, chart review  HPI: Carl Young is a 83 y.o. male with past medical history of hypertension, hyperlipidemia, postoperative atrial fibrillation, coronary artery disease status post CABG, CKD stage III who presented on 08/26/2023.  States around 1230-1pm he noticed that he was feeling off balance and leaning towards the left side.  On arrival to hospital MRI brain was obtained which showed acute ischemic stroke.  Neurology was consulted for further recommendation.  Appropriate is on aspirin 81 mg daily at baseline.  States he developed A-fib briefly after his CABG in 2021.  Last known normal: 08/26/2023 at around 12:30 PM No tPA as outside window No thrombectomy as no large vessel occlusion Event happened at home mRS 0   ROS: All other systems reviewed and negative except as noted in the HPI.  Past Medical History:  Diagnosis Date   Bell's palsy    CAD (coronary artery disease)    Multivessel status post CABG 05/2020 - LIMA to LAD, SVG to ramus intermedius, SVG to OM 2 and OM 3, and SVG to PDA   CKD (chronic kidney disease) stage 3, GFR 30-59 ml/min (HCC)    Essential hypertension    Helicobacter pylori (H. pylori) infection    History of GI bleed    Mixed hyperlipidemia    Nephrolithiasis    Postoperative atrial fibrillation (HCC)     Family History  Problem Relation Age of Onset   COPD Mother    Heart disease Father    Stroke Father    Breast  cancer Sister    Melanoma Brother    Kidney disease Maternal Grandmother      Social History:  reports that he quit smoking about 38 years ago. His smoking use included cigarettes and cigars. He has never been exposed to tobacco smoke. He has never used smokeless tobacco. He reports current alcohol use. He reports that he does not use drugs.   Medications Prior to Admission  Medication Sig Dispense Refill Last Dose/Taking   acetaminophen (TYLENOL) 500 MG tablet Take 500 mg by mouth every 6 (six) hours as needed for mild pain (pain score 1-3).   08/24/2023   amLODipine (NORVASC) 10 MG tablet TAKE 1 TABLET BY MOUTH DAILY 30 tablet 6 08/26/2023 Morning   ascorbic acid (VITAMIN C) 1000 MG tablet Take 1,000 mg by mouth daily.    08/26/2023 Morning   ASPIRIN LOW DOSE 81 MG tablet TAKE 1 TABLET BY MOUTH DAILY, swallow whole 90 tablet 1 08/26/2023 Morning   benazepril (LOTENSIN) 40 MG tablet Take 40 mg by mouth daily.   4 08/26/2023 Morning   carboxymethylcellulose (REFRESH PLUS) 0.5 % SOLN Place 1 drop into both eyes daily as needed (dry eyes).   08/25/2023 Morning   Cholecalciferol (VITAMIN D3) 50 MCG (2000 UT) capsule Take 2,000 Units by mouth every evening.    08/25/2023 Bedtime   fenofibrate (TRICOR) 145 MG tablet Take 145 mg by mouth every evening.    08/25/2023 Bedtime   fish oil-omega-3 fatty acids 1000 MG  capsule Take 1 g by mouth in the morning and at bedtime.   08/26/2023 Morning   folic acid (FOLVITE) 400 MCG tablet Take 400 mcg by mouth daily.    08/25/2023 Bedtime   Garlic 1000 MG CAPS Take 1,000 mg by mouth at bedtime.    08/25/2023 Bedtime   Lysine 500 MG CAPS Take 500 mg by mouth daily.    08/26/2023 Morning   metoprolol succinate (TOPROL-XL) 50 MG 24 hr tablet Take 25 mg by mouth 2 (two) times daily.   08/26/2023 Morning   Misc Natural Products (BEET ROOT PO) Take 1 capsule by mouth daily.   08/25/2023 Bedtime   Multiple Vitamin (MULTIVITAMIN WITH MINERALS) TABS Take 1 tablet by mouth daily.    08/26/2023 Morning   Multiple Vitamins-Minerals (PRESERVISION AREDS 2+MULTI VIT PO) Take 1 capsule by mouth 2 (two) times daily.   08/26/2023 Morning   rosuvastatin (CRESTOR) 40 MG tablet Take 1 tablet (40 mg total) by mouth daily. 30 tablet 1 08/25/2023 Bedtime   zinc gluconate 50 MG tablet Take 50 mg by mouth every evening.    08/25/2023 Bedtime      Exam: Current vital signs: BP 134/72 (BP Location: Left Arm)   Pulse 80   Temp 98 F (36.7 C) (Oral)   Resp 19   Ht 5\' 11"  (1.803 m)   Wt 93.9 kg   SpO2 94%   BMI 28.87 kg/m  Vital signs in last 24 hours: Temp:  [97.5 F (36.4 C)-98.8 F (37.1 C)] 98 F (36.7 C) (02/27 1045) Pulse Rate:  [63-80] 80 (02/27 1045) Resp:  [17-20] 19 (02/27 1045) BP: (134-158)/(69-79) 134/72 (02/27 1045) SpO2:  [93 %-100 %] 94 % (02/27 1045)   Physical Exam  Constitutional: Appears well-developed and well-nourished.  Psych: Affect appropriate to situation Neuro: AOx3, cranial nerves grossly intact, antigravity strength without drift in upper extremities, FTN intact bilaterally, sensory intact to light touch   NIHSS 0  I have reviewed labs in epic and the results pertinent to this consultation are: CBC:  Recent Labs  Lab 08/26/23 1456  WBC 6.6  NEUTROABS 3.8  HGB 15.0  HCT 45.6  MCV 95.2  PLT 173    Basic Metabolic Panel:  Lab Results  Component Value Date   NA 141 08/26/2023   K 4.1 08/26/2023   CO2 24 08/26/2023   GLUCOSE 90 08/26/2023   BUN 23 08/26/2023   CREATININE 1.21 08/26/2023   CALCIUM 9.9 08/26/2023   GFRNONAA 59 (L) 08/26/2023   GFRAA 43 (L) 03/24/2020   Lipid Panel:  Lab Results  Component Value Date   LDLCALC 36 08/27/2023   HgbA1c:  Lab Results  Component Value Date   HGBA1C 6.2 (H) 08/26/2023   Urine Drug Screen: No results found for: "LABOPIA", "COCAINSCRNUR", "LABBENZ", "AMPHETMU", "THCU", "LABBARB"  Alcohol Level No results found for: "ETH"   I have reviewed the images obtained:   CT Head without  contrast 08/26/2023: No acute intracranial pathology. Mild age-related atrophy and chronic microvascular ischemic changes.  MRI Brain and MRA head wo contrast 08/26/2023:  Patchy small volume acute to early subacute right PCA distribution infarcts involving the right temporoccipital junction and right thalamus. No associated hemorrhage or mass effect. Underlying moderate chronic microvascular ischemic disease. Negative intracranial MRA for large vessel occlusion. No hemodynamically significant or correctable stenosis.     ASSESSMENT/PLAN: 83 year old male with history of coronary artery disease status post CABG and only postoperative atrial fibrillation who presented on 08/26/2023 with trouble walking  and falling to the left.  Acute ischemic stroke -Etiology: Cardioembolic versus atheroembolic  Recommendations: -Carotid ultrasound to look for right carotid stenosis, atheroembolic disease -If there is significant right carotid stenosis/atheroembolic disease, would recommend aspirin 81 mg daily and Plavix 75 mg daily for 3 months followed by monotherapy with Plavix 75 mg daily -If carotid ultrasound is within normal limits, I have discussed the case with patient's primary cardiologist Dr. Diona Browner as well as stroke neurologist Dr. Roda Shutters. Given his history of A-fib after CABG, he is definitely increased risk of atrial fibrillation.  He has already been on Eliquis for a day and a half and tolerated it well.  Therefore would recommend continuing Eliquis 5 mg twice daily due to high suspicion for cardioembolic etiology.  If he is discharged on Eliquis, then we do not need to discharge him on any antiplatelets -Recommend cardiac monitor to look for paroxysmal A. fib -Follow-up with neurology in 3 months (order placed) -PT/OT -Goal blood pressure: Normotension -Stroke education -Discussed plan with Dr. Sherryll Burger via secure chat   Thank you for allowing [place] to participate in the care of this patient. If you have  any further questions, please contact  me or neurohospitalist.   Lindie Spruce [facility] neuro[facility]ist

## 2023-08-28 NOTE — Progress Notes (Signed)
 [facility] IP rehab admissions - Noted PT/OT now recommending home with [facility] services.  Patient is already doing well and does not need an inpatient rehab stay.  I will sign off for CIR at this time.  [phone]

## 2023-08-28 NOTE — Progress Notes (Signed)
 PROGRESS NOTE    Carl Young  UJW:[id number] DOB: 05-31-1941 DOA: 08/26/2023 PCP: Ignatius Specking, MD  Subjective: Pt seen and examined. He felt fine before he went outside to get his mail. Walked about 70-80 yards(each way). And then went back inside to take a nap. When he woke up from his nap, he could not walk without feeling like he was going to fall down.  No vision changes.  MRI brain shows "Patchy small volume acute to early subacute right PCA distribution infarcts  involving the right temporoccipital junction and right thalamus"  Neurology consulted and felt pt could be kept at [facility].   Hospital Course: HPI: Carl Young an 83 y/o with h/o HTN, CAD s/p CABG reports for the past several days has been feeling like his head was full and slightly dizzy. today he woke up feeling like this as well but was abmulatory, able to walk down his driveway to the mailbox. At 1:00 approximately he tried to get up from his nap and states he was trying to walk straight and he was falling to the left. He did not fall per se but let himself down in front of the couch. His wife was able with some difficulty to get him up. He was brought to [facility] for evaluation. He denies an Chest pain, any focal weakness or loss of sensation, his speech remained clear, no report of facial droop, no SOB/DOE.     ED Course: afebrile, 161/81  HR 82  RR 17. General - patient seen while on a stretcher in the hallway of the ED. He is awake alert, cognating normally and his speech is clear. He has good recollection of the days events. Lab: Bmet - nl, CBCD - nl, U/A negative. CT head without acute injury or hemorrhage. 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 MRQ- no LVO. EDP consulted Dr. Selina Cooley who reviewed the MRI/MRA. The recommendation was to admit to [facility]. [facility] called to admit for routince stroke protocol.   Significant Events: Admitted 08/26/2023 for acute CVA   Significant Labs: WBC 6.6, HgB 15, Plt 173 Na  141, K 4.1, CO2 of 24, BUN 23, Scr 1.21, glu 90 TC 135, TG 378, LDL 36, HDL 23 HgA1c   Significant Imaging Studies: CT head No acute intracranial pathology. 2. Mild age-related atrophy and chronic microvascular ischemic changes. MRI brain Patchy small volume acute to early subacute right PCA distribution infarcts involving the right temporoccipital junction and right thalamus. No associated hemorrhage or mass effect. 2. Underlying moderate chronic microvascular ischemic disease.  Antibiotic Therapy: Anti-infectives (From admission, onward)    None       Procedures:   Consultants:     Assessment and Plan: * CVA (cerebral vascular accident) (HCC) On admission. Patient with h/o   transient a. 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 Dr. Selina Cooley reviewed MRI. MRA negative for LVO. Dr. Selina Cooley felt admission to [facility] reasonable - no need to transfer. High probability of embolic CVA although ASVD also possible. Plan. Stroke protocol observation admission to telemetry. Eliquis 5 mg bid.   08-27-2023 have reached out to cardiology to arrange for 14 day ziopatch(non-live) as outpatient. Continue with Eliquis. Check echo. He will need walker. Outpatient PT/OT. Pt states he feels like his legs are very heavy with walking. He and wife do not have any children. He was still driving a car and performing ADLs prior to  admission. Continue ASA due to hx of CABG. On crestor and fenofibrate.  PAF (paroxysmal atrial fibrillation) (HCC) Patient reports he "had some a fib around the time of his CABG but was not aware of being in a fib on a regular basis. He was taking ASA 81 mg daily as part of his cardiovascular regimen.  Plan Tele obs admit  Eliquis 5mg  bid  Essential hypertension On admission. BP a little high. Continue present home medications  08-27-2023 allow for permissive hypertension for now.   DVT  prophylaxis:      Code Status: Full Code Family Communication: no family at bedside. He is decisional. Disposition Plan: return home Reason for continuing need for hospitalization: awaiting echo. Arranging outpatient ziopatch. Continue with PT/OT.  Objective: Vitals:   08/28/23 0126 08/28/23 0603 08/28/23 1045 08/28/23 1521  BP: (!) 150/79 (!) 158/79 134/72 (!) 147/78  Pulse: 72 63 80 78  Resp: 18 20 19 18   Temp: (!) 97.5 F (36.4 C) 97.6 F (36.4 C) 98 F (36.7 C) 98.3 F (36.8 C)  TempSrc: Oral Oral Oral Oral  SpO2: 100% 96% 94% 93%  Weight:      Height:        Intake/Output Summary (Last 24 hours) at 08/28/2023 1829 Last data filed at 08/28/2023 1443 Gross per 24 hour  Intake 480 ml  Output --  Net 480 ml   Filed Weights   08/26/23 1419  Weight: 93.9 kg    Examination:  Physical Exam Vitals and nursing note reviewed.  Constitutional:      General: He is not in acute distress.    Appearance: He is not toxic-appearing or diaphoretic.  HENT:     Head: Normocephalic and atraumatic.     Nose: Nose normal.  Eyes:     General: No scleral icterus. Cardiovascular:     Rate and Rhythm: Normal rate and regular rhythm.  Pulmonary:     Effort: Pulmonary effort is normal.     Breath sounds: Normal breath sounds.  Abdominal:     General: Abdomen is flat. Bowel sounds are normal. There is no distension.     Palpations: Abdomen is soft.  Musculoskeletal:     Right lower leg: No edema.     Left lower leg: No edema.  Skin:    General: Skin is warm and dry.     Capillary Refill: Capillary refill takes less than 2 seconds.  Neurological:     General: No focal deficit present.     Mental Status: He is alert and oriented to person, place, and time.    Data Reviewed: I have personally reviewed following labs and imaging studies  CBC: Recent Labs  Lab 08/26/23 1456  WBC 6.6  NEUTROABS 3.8  HGB 15.0  HCT 45.6  MCV 95.2  PLT 173   Basic Metabolic Panel: Recent  Labs  Lab 08/26/23 1456  NA 141  K 4.1  CL 104  CO2 24  GLUCOSE 90  BUN 23  CREATININE 1.21  CALCIUM 9.9   GFR: Estimated Creatinine Clearance: 52.2 mL/min (by C-G formula based on SCr of 1.21 mg/dL). Lipid Profile: Recent Labs    08/27/23 0357  CHOL 135  HDL 23*  LDLCALC 36  TRIG 191*  CHOLHDL 5.9   Recent Results (from the past 240 hours)  Resp panel by RT-PCR (RSV, Flu A&B, Covid) Anterior Nasal Swab     Status: None   Collection Time: 08/26/23  6:56 PM   Specimen: Anterior Nasal Swab  Result Value Ref Range Status   SARS Coronavirus 2 by RT PCR NEGATIVE NEGATIVE Final    Comment: (NOTE) SARS-CoV-2 target nucleic acids are NOT DETECTED.  The SARS-CoV-2 RNA is generally detectable in upper respiratory specimens during the acute phase of infection. The lowest concentration of SARS-CoV-2 viral copies this assay can detect is 138 copies/mL. A negative result does not preclude SARS-Cov-2 infection and should not be used as the sole basis for treatment or other patient management decisions. A negative result may occur with  improper specimen collection/handling, submission of specimen other than nasopharyngeal swab, presence of viral mutation(s) within the areas targeted by this assay, and inadequate number of viral copies(<138 copies/mL). A negative result must be combined with clinical observations, patient history, and epidemiological information. The expected result is Negative.  Fact Sheet for Patients:  BloggerCourse.com  Fact Sheet for Healthcare Providers:  SeriousBroker.it  This test is no t yet approved or cleared by the [place] FDA and  has been authorized for detection and/or diagnosis of SARS-CoV-2 by FDA under an Emergency Use Authorization (EUA). This EUA will remain  in effect (meaning this test can be used) for the duration of the COVID-19 declaration under Section 564(b)(1) of the Act,  21 U.S.C.section 360bbb-3(b)(1), unless the authorization is terminated  or revoked sooner.       Influenza A by PCR NEGATIVE NEGATIVE Final   Influenza B by PCR NEGATIVE NEGATIVE Final    Comment: (NOTE) The Xpert Xpress SARS-CoV-2/FLU/RSV plus assay is intended as an aid in the diagnosis of influenza from Nasopharyngeal swab specimens and should not be used as a sole basis for treatment. Nasal washings and aspirates are unacceptable for Xpert Xpress SARS-CoV-2/FLU/RSV testing.  Fact Sheet for Patients: BloggerCourse.com  Fact Sheet for Healthcare Providers: SeriousBroker.it  This test is not yet approved or cleared by the [place] FDA and has been authorized for detection and/or diagnosis of SARS-CoV-2 by FDA under an Emergency Use Authorization (EUA). This EUA will remain in effect (meaning this test can be used) for the duration of the COVID-19 declaration under Section 564(b)(1) of the Act, 21 U.S.C. section 360bbb-3(b)(1), unless the authorization is terminated or revoked.     Resp Syncytial Virus by PCR NEGATIVE NEGATIVE Final    Comment: (NOTE) Fact Sheet for Patients: BloggerCourse.com  Fact Sheet for Healthcare Providers: SeriousBroker.it  This test is not yet approved or cleared by the [place] FDA and has been authorized for detection and/or diagnosis of SARS-CoV-2 by FDA under an Emergency Use Authorization (EUA). This EUA will remain in effect (meaning this test can be used) for the duration of the COVID-19 declaration under Section 564(b)(1) of the Act, 21 U.S.C. section 360bbb-3(b)(1), unless the authorization is terminated or revoked.  Performed at [facility], [address], [place], [place] [address]      Radiology Studies: ECHOCARDIOGRAM COMPLETE Result Date: 08/27/2023    ECHOCARDIOGRAM REPORT   Patient Name:   AYVEN PHEASANT Date of  Exam: 08/27/2023 Medical Rec #:  [id number]       Height:       71.0 in Accession #:    [id number]      Weight:       207.0 lb Date of Birth:  1940/07/11       BSA:          2.140 m Patient Age:    83 years        BP:  135/67 mmHg Patient Gender: M               HR:           79 bpm. [facility] Procedure: 2D Echo, Cardiac Doppler and Color Doppler (Both Spectral and Color            Flow Doppler were utilized during procedure). Indications:    Stroke  History:        Patient has prior history of Echocardiogram examinations, most                 recent 04/01/2023. CAD, Prior CABG, Stroke, Arrythmias:Atrial                 Fibrillation; Risk Factors:Hypertension, Dyslipidemia and Former                 Smoker. CKD.  Sonographer:    Vern Claude Referring Phys: [id number] ERIC CHEN IMPRESSIONS  1. Left ventricular ejection fraction, by estimation, is 60 to 65%. The left ventricle has normal function. The left ventricle has no regional wall motion abnormalities. There is mild concentric left ventricular hypertrophy. Left ventricular diastolic parameters are consistent with Grade I diastolic dysfunction (impaired relaxation).  2. Right ventricular systolic function is normal. The right ventricular size is normal. Tricuspid regurgitation signal is inadequate for assessing PA pressure.  3. The mitral valve is degenerative. Trivial mitral valve regurgitation.  4. The aortic valve has an indeterminant number of cusps. There is moderate calcification of the aortic valve. Aortic valve stenosis is moderate to severe, likely paradoxical normal flow/low gradient. Aortic valve mean gradient measures 16.4 mmHg. Dimentionless index 0.30.  5. Aortic dilatation noted. There is borderline dilatation of the aortic root, measuring 39 mm. There is mild dilatation of the ascending aorta, measuring 38 mm.  6. Unable to estimate CVP. Comparison(s): Prior images reviewed side by side. Moderate to severe paradoxical normal  flow/low gradient aortic stenosis with mean AV gradient only 16 mmHg and DI 0.30. FINDINGS  Left Ventricle: Left ventricular ejection fraction, by estimation, is 60 to 65%. The left ventricle has normal function. The left ventricle has no regional wall motion abnormalities. Strain imaging was not performed. The left ventricular internal cavity  size was normal in size. There is mild concentric left ventricular hypertrophy. Left ventricular diastolic parameters are consistent with Grade I diastolic dysfunction (impaired relaxation). Right Ventricle: The right ventricular size is normal. No increase in right ventricular wall thickness. Right ventricular systolic function is normal. Tricuspid regurgitation signal is inadequate for assessing PA pressure. Left Atrium: Left atrial size was normal in size. Right Atrium: Right atrial size was normal in size. Pericardium: There is no evidence of pericardial effusion. Presence of epicardial fat layer. Mitral Valve: The mitral valve is degenerative in appearance. There is mild thickening of the mitral valve leaflet(s). There is mild calcification of the mitral valve leaflet(s). Mild mitral annular calcification. Trivial mitral valve regurgitation. MV peak gradient, 2.9 mmHg. The mean mitral valve gradient is 1.0 mmHg. Tricuspid Valve: The tricuspid valve is grossly normal. Tricuspid valve regurgitation is trivial. Aortic Valve: The aortic valve has an indeterminant number of cusps. There is moderate calcification of the aortic valve. There is moderate aortic valve annular calcification. Aortic valve regurgitation is trivial. Moderate to severe aortic stenosis is present. Aortic valve mean gradient measures 16.4 mmHg. Aortic valve peak gradient measures 28.2 mmHg. Aortic valve area, by VTI measures 0.93 cm. Pulmonic Valve: The pulmonic valve was grossly  normal. Pulmonic valve regurgitation is trivial. Aorta: Aortic dilatation noted. There is borderline dilatation of the aortic  root, measuring 39 mm. There is mild dilatation of the ascending aorta, measuring 38 mm. Venous: Unable to estimate CVP. The inferior vena cava was not well visualized. IAS/Shunts: The interatrial septum was not well visualized. Additional Comments: 3D imaging was not performed.  LEFT VENTRICLE PLAX 2D LVIDd:         4.50 cm      Diastology LVIDs:         2.80 cm      LV e' medial:    6.20 cm/s LV PW:         1.00 cm      LV E/e' medial:  10.2 LV IVS:        1.20 cm      LV e' lateral:   7.94 cm/s LVOT diam:     2.00 cm      LV E/e' lateral: 8.0 LV SV:         49 LV SV Index:   23 LVOT Area:     3.14 cm  LV Volumes (MOD) LV vol d, MOD A2C: 143.0 ml LV vol d, MOD A4C: 116.0 ml LV vol s, MOD A2C: 52.8 ml LV vol s, MOD A4C: 42.7 ml LV SV MOD A2C:     90.2 ml LV SV MOD A4C:     116.0 ml LV SV MOD BP:      87.1 ml RIGHT VENTRICLE RV Basal diam:  2.40 cm RV Mid diam:    1.50 cm RV S prime:     8.16 cm/s TAPSE (M-mode): 1.6 cm LEFT ATRIUM             Index        RIGHT ATRIUM           Index LA diam:        2.70 cm 1.26 cm/m   RA Area:     11.70 cm LA Vol (A2C):   51.3 ml 23.98 ml/m  RA Volume:   19.90 ml  9.30 ml/m LA Vol (A4C):   39.1 ml 18.27 ml/m LA Biplane Vol: 44.8 ml 20.94 ml/m  AORTIC VALVE                     PULMONIC VALVE AV Area (Vmax):    1.00 cm      PV Vmax:       1.01 m/s AV Area (Vmean):   1.00 cm      PV Peak grad:  4.1 mmHg AV Area (VTI):     0.93 cm AV Vmax:           265.40 cm/s AV Vmean:          189.600 cm/s AV VTI:            0.527 m AV Peak Grad:      28.2 mmHg AV Mean Grad:      16.4 mmHg LVOT Vmax:         84.20 cm/s LVOT Vmean:        60.100 cm/s LVOT VTI:          0.156 m LVOT/AV VTI ratio: 0.30  AORTA Ao Root diam: 3.90 cm Ao Asc diam:  3.80 cm MITRAL VALVE MV Area (PHT): 4.12 cm    SHUNTS MV Area VTI:   3.66 cm    Systemic VTI:  0.16 m MV Peak grad:  2.9  mmHg    Systemic Diam: 2.00 cm MV Mean grad:  1.0 mmHg MV Vmax:       0.85 m/s MV Vmean:      54.3 cm/s MV Decel Time: 184 msec  MV E velocity: 63.40 cm/s MV A velocity: 86.10 cm/s MV E/A ratio:  0.74 Nona Dell MD Electronically signed by Nona Dell MD Signature Date/Time: 08/27/2023/1:09:11 PM    Final    MR BRAIN WO CONTRAST Result Date: 08/26/2023 CLINICAL DATA:  Initial evaluation for acute neuro deficit, stroke suspected. EXAM: MRI HEAD WITHOUT CONTRAST MRA HEAD WITHOUT CONTRAST TECHNIQUE: Multiplanar, multi-echo pulse sequences of the brain and surrounding structures were acquired without intravenous contrast. Angiographic images of the Circle of Willis were acquired using MRA technique without intravenous contrast. COMPARISON:  CT from earlier the same day. FINDINGS: MRI HEAD FINDINGS Brain: Cerebral volume within normal limits. Patchy T2/FLAIR hyperintensity involving the periventricular deep white matter both cerebral hemispheres, consistent with chronic small vessel ischemic disease, moderate in nature. Patchy foci of restricted diffusion involving the right temporal occipital junction and right thalamus, consistent with a small acute to early subacute right PCA distribution infarct (series 6, images 19, 22). No associated hemorrhage or mass effect. No other evidence for acute or subacute ischemia. No acute intracranial hemorrhage. Few chronic micro hemorrhages noted at the left frontal and right temporal lobes. No mass lesion, midline shift or mass effect. No hydrocephalus or extra-axial fluid collection. Pituitary gland within normal limits. Vascular: Major intracranial vascular flow voids are maintained. Skull and upper cervical spine: Craniocervical junction within normal limits. Bone marrow signal intensity normal. No scalp soft tissue abnormality. Sinuses/Orbits: Prior bilateral ocular lens replacement. Paranasal sinuses are clear. No significant mastoid effusion. Other: None. MRA HEAD FINDINGS Anterior circulation: Examination mildly degraded by motion artifact. Both internal carotid arteries are patent through  the siphons without hemodynamically significant stenosis. A1 segments patent bilaterally. Normal anterior communicating artery complex. Evaluation of the proximal ACAs mildly limited by motion. Visualized ACAs patent to their distal aspects without convincing stenosis. No significant M1 stenosis. No proximal MCA branch occlusion or high-grade stenosis. Distal MCA branches perfused and fairly symmetric. Posterior circulation: Visualized distal V4 segments patent without stenosis. Both PICA patent. Basilar patent without significant stenosis. Superior cerebral arteries patent bilaterally. Right PCA supplied via the basilar. Left PCA supplied via a hypoplastic left P1 segment and robust left posterior communicating artery. Both PCAs patent to their distal aspects without significant stenosis. Anatomic variants: As above.  No aneurysm. IMPRESSION: MRI HEAD: 1. Patchy small volume acute to early subacute right PCA distribution infarcts involving the right temporoccipital junction and right thalamus. No associated hemorrhage or mass effect. 2. Underlying moderate chronic microvascular ischemic disease. MRA HEAD: Negative intracranial MRA for large vessel occlusion. No hemodynamically significant or correctable stenosis. Electronically Signed   By: Rise Mu M.D.   On: 08/26/2023 19:04   MR ANGIO HEAD WO CONTRAST Result Date: 08/26/2023 CLINICAL DATA:  Initial evaluation for acute neuro deficit, stroke suspected. EXAM: MRI HEAD WITHOUT CONTRAST MRA HEAD WITHOUT CONTRAST TECHNIQUE: Multiplanar, multi-echo pulse sequences of the brain and surrounding structures were acquired without intravenous contrast. Angiographic images of the Circle of Willis were acquired using MRA technique without intravenous contrast. COMPARISON:  CT from earlier the same day. FINDINGS: MRI HEAD FINDINGS Brain: Cerebral volume within normal limits. Patchy T2/FLAIR hyperintensity involving the periventricular deep white matter both  cerebral hemispheres, consistent with chronic small vessel ischemic disease, moderate in nature. Patchy foci of restricted  diffusion involving the right temporal occipital junction and right thalamus, consistent with a small acute to early subacute right PCA distribution infarct (series 6, images 19, 22). No associated hemorrhage or mass effect. No other evidence for acute or subacute ischemia. No acute intracranial hemorrhage. Few chronic micro hemorrhages noted at the left frontal and right temporal lobes. No mass lesion, midline shift or mass effect. No hydrocephalus or extra-axial fluid collection. Pituitary gland within normal limits. Vascular: Major intracranial vascular flow voids are maintained. Skull and upper cervical spine: Craniocervical junction within normal limits. Bone marrow signal intensity normal. No scalp soft tissue abnormality. Sinuses/Orbits: Prior bilateral ocular lens replacement. Paranasal sinuses are clear. No significant mastoid effusion. Other: None. MRA HEAD FINDINGS Anterior circulation: Examination mildly degraded by motion artifact. Both internal carotid arteries are patent through the siphons without hemodynamically significant stenosis. A1 segments patent bilaterally. Normal anterior communicating artery complex. Evaluation of the proximal ACAs mildly limited by motion. Visualized ACAs patent to their distal aspects without convincing stenosis. No significant M1 stenosis. No proximal MCA branch occlusion or high-grade stenosis. Distal MCA branches perfused and fairly symmetric. Posterior circulation: Visualized distal V4 segments patent without stenosis. Both PICA patent. Basilar patent without significant stenosis. Superior cerebral arteries patent bilaterally. Right PCA supplied via the basilar. Left PCA supplied via a hypoplastic left P1 segment and robust left posterior communicating artery. Both PCAs patent to their distal aspects without significant stenosis. Anatomic  variants: As above.  No aneurysm. IMPRESSION: MRI HEAD: 1. Patchy small volume acute to early subacute right PCA distribution infarcts involving the right temporoccipital junction and right thalamus. No associated hemorrhage or mass effect. 2. Underlying moderate chronic microvascular ischemic disease. MRA HEAD: Negative intracranial MRA for large vessel occlusion. No hemodynamically significant or correctable stenosis. Electronically Signed   By: Rise Mu M.D.   On: 08/26/2023 19:04    Scheduled Meds:  amLODipine  10 mg Oral Daily   aspirin EC  81 mg Oral Daily   benazepril  40 mg Oral Daily   fenofibrate  160 mg Oral Daily   folic acid  0.5 mg Oral QHS   metoprolol succinate  25 mg Oral BID   rosuvastatin  40 mg Oral QHS   Continuous Infusions:   LOS: 1 day   Time spent: 40 minutes  Kalissa Grays Hoover Brunette, DO  [facility]  08/28/2023, 6:29 PM

## 2023-08-28 NOTE — TOC Transition Note (Addendum)
 Transition of Care [facility]) - Discharge Note   Patient Details  Name: Carl Young MRN: [id number] Date of Birth: Mar 01, 1941  Transition of Care [facility]) CM/SW Contact:  Villa Herb, LCSWA Phone Number: 08/28/2023, 12:21 PM  Clinical Narrative:    CSW updated that PT and OT are now recommending [facility] services. CSW spoke with pts spouse who is at bedside. Pt is agreeable to [facility] and does not have an agency preference. CSW spoke to [place] with [facility] who states they can accept referral. HH orders placed by MD. Rolling walker ordered for pt, CSW spoke with pts spouse who states that they have a walker in the home and does not need this to be ordered. TOC signing off.   Final next level of care: Home w Home Health Services Barriers to Discharge: Barriers Resolved   Patient Goals and CMS Choice Patient states their goals for this hospitalization and ongoing recovery are:: return home CMS Medicare.gov Compare Post Acute Care list provided to:: Patient Choice offered to / list presented to : Patient, Spouse      Discharge Placement                       Discharge Plan and Services Additional resources added to the After Visit Summary for                            [facility] Arranged: PT, OT HH Agency: [facility] Date [facility] Agency Contacted: 08/28/23   Representative spoke with at [facility] Agency: Adele Dan  Social Drivers of Health (SDOH) Interventions SDOH Screenings   Food Insecurity: No Food Insecurity (08/27/2023)  Housing: Low Risk  (08/27/2023)  Transportation Needs: No Transportation Needs (08/27/2023)  Utilities: Not At Risk (08/27/2023)  Depression (PHQ2-9): Low Risk  (11/17/2020)  Social Connections: Socially Integrated (08/27/2023)  Tobacco Use: Medium Risk (08/26/2023)     Readmission Risk Interventions    08/28/2023   12:20 PM  Readmission Risk Prevention Plan  Medication Screening Complete  Transportation Screening Complete

## 2023-08-28 NOTE — Progress Notes (Signed)
 Physical Therapy Treatment Patient Details Name: Carl Young MRN: [id number] DOB: 04/02/1941 Today's Date: 08/28/2023   History of Present Illness Mr.Fittro an 83 y/o with h/o HTN, CAD s/p CABG reports for the past several days has been feeling like his head was full and slightly dizzy. today he woke up feeling like this as well but was abmulatory, able to walk down his driveway to the mailbox. At 1:00 approximately he tried to get up from his nap and states he was trying to walk straight and he was falling to the left. He did not fall per se but let himself down in front of the couch. His wife was able with some difficulty to get him up. He was brought to [facility] for evaluation. He denies an Chest pain, any focal weakness or loss of sensation, his speech remained clear, no report of facial droop, no SOB/DOE.    PT Comments  Patient demonstrates fair/good return for using RW during gait training without loss of balance, good return for going up/down steps in stairwell with spouse present and patient and spouse acknowledges understanding.  Patient given gait belt for home use.  Patient tolerated sitting up in chair after therapy.  Patient will benefit from continued skilled physical therapy in hospital and recommended venue below to increase strength, balance, endurance for safe ADLs and gait.     If plan is discharge home, recommend the following: A little help with walking and/or transfers;A little help with bathing/dressing/bathroom;Help with stairs or ramp for entrance;Assistance with cooking/housework   Can travel by private vehicle        Equipment Recommendations  None recommended by PT    Recommendations for Other Services       Precautions / Restrictions Precautions Precautions: Fall Restrictions Weight Bearing Restrictions Per Provider Order: No     Mobility  Bed Mobility Overal bed mobility: Modified Independent             General bed mobility comments: good return  for getting into/out of bed with HOB flat    Transfers Overall transfer level: Needs assistance   Transfers: Sit to/from Stand, Bed to chair/wheelchair/BSC Sit to Stand: Supervision, Modified independent (Device/Increase time)   Step pivot transfers: Supervision, Modified independent (Device/Increase time)       General transfer comment: slow labored movement without loss of balance using SPC and RW    Ambulation/Gait Ambulation/Gait assistance: Supervision Gait Distance (Feet): 80 Feet Assistive device: Rolling walker (2 wheels) Gait Pattern/deviations: Decreased step length - right, Decreased step length - left, Decreased stride length, Shuffle Gait velocity: decreased     General Gait Details: able to walk a few feet using SPC with short shuffling steps, safer using RW with good return for ambulating in room, hallway without loss of balance, but decreased heel to toe stepping   Stairs Stairs: Yes   Stair Management: One rail Right, Step to pattern Number of Stairs: 3 General stair comments: demonstrates good return for going up/down steps in stairwell using both hands on 1 side rail with step to pattern without loss of balance and understanding acknowledged by patient and spouse   Wheelchair Mobility     Tilt Bed    Modified Rankin (Stroke Patients Only)       Balance Overall balance assessment: Needs assistance Sitting-balance support: Feet supported, No upper extremity supported Sitting balance-Leahy Scale: Good Sitting balance - Comments: seated at EOB   Standing balance support: During functional activity, Single extremity supported Standing balance-Leahy Scale: Poor  Standing balance comment: fair/poor using SPC, fair/good using RW                            Communication Communication Communication: No apparent difficulties  Cognition Arousal: Alert Behavior During Therapy: WFL for tasks assessed/performed   PT - Cognitive impairments:  No apparent impairments                         Following commands: Intact      Cueing Cueing Techniques: Verbal cues  Exercises      General Comments        Pertinent Vitals/Pain Pain Assessment Pain Assessment: No/denies pain    Home Living                          Prior Function            PT Goals (current goals can now be found in the care plan section) Acute Rehab PT Goals Patient Stated Goal: return home and get better PT Goal Formulation: With patient/family Time For Goal Achievement: 09/10/23 Potential to Achieve Goals: Good Progress towards PT goals: Progressing toward goals    Frequency    Min 3X/week      PT Plan      Co-evaluation              AM-PAC PT "6 Clicks" Mobility   Outcome Measure  Help needed turning from your back to your side while in a flat bed without using bedrails?: None Help needed moving from lying on your back to sitting on the side of a flat bed without using bedrails?: None Help needed moving to and from a bed to a chair (including a wheelchair)?: A Little Help needed standing up from a chair using your arms (e.g., wheelchair or bedside chair)?: A Little Help needed to walk in hospital room?: A Little Help needed climbing 3-5 steps with a railing? : A Little 6 Click Score: 20    End of Session Equipment Utilized During Treatment: Gait belt Activity Tolerance: Patient tolerated treatment well;Patient limited by fatigue Patient left: in chair;with call bell/phone within reach;with family/visitor present Nurse Communication: Mobility status PT Visit Diagnosis: Unsteadiness on feet (R26.81);Other abnormalities of gait and mobility (R26.89);Muscle weakness (generalized) (M62.81)     Time: 0920-0950 PT Time Calculation (min) (ACUTE ONLY): 30 min  Charges:    $Gait Training: 8-22 mins $Therapeutic Activity: 8-22 mins PT General Charges $$ ACUTE PT VISIT: 1 Visit                     12:06  PM, 08/28/23 Ocie Bob, MPT Physical Therapist with [facility] [phone] office [phone] mobile phone

## 2023-08-28 NOTE — Plan of Care (Signed)
  Problem: Education: Goal: Knowledge of disease or condition will improve Outcome: Progressing Goal: Knowledge of secondary prevention will improve (MUST DOCUMENT ALL) Outcome: Progressing Goal: Knowledge of patient specific risk factors will improve (DELETE if not current risk factor) Outcome: Progressing   Problem: Education: Goal: Knowledge of secondary prevention will improve (MUST DOCUMENT ALL) Outcome: Progressing   Problem: Education: Goal: Knowledge of patient specific risk factors will improve (DELETE if not current risk factor) Outcome: Progressing   Problem: Ischemic Stroke/TIA Tissue Perfusion: Goal: Complications of ischemic stroke/TIA will be minimized Outcome: Progressing

## 2023-08-28 NOTE — Progress Notes (Signed)
 Occupational Therapy Treatment Patient Details Name: Carl Young MRN: [id number] DOB: 1940-10-13 Today's Date: 08/28/2023   History of present illness Carl Young an 83 y/o with h/o HTN, CAD s/p CABG reports for the past several days has been feeling like his head was full and slightly dizzy. today he woke up feeling like this as well but was abmulatory, able to walk down his driveway to the mailbox. At 1:00 approximately he tried to get up from his nap and states he was trying to walk straight and he was falling to the left. He did not fall per se but let himself down in front of the couch. His wife was able with some difficulty to get him up. He was brought to [facility] for evaluation. He denies an Chest pain, any focal weakness or loss of sensation, his speech remained clear, no report of facial droop, no SOB/DOE.   OT comments  Pt agreeable to OT treatment. Pt reportedly slept very well last night. Pt was able to complete bed mobility with mod I using the bed rail. Pt able to transfer to the toilet and urinate while standing using the RW and CGA to supervision assist. Pt then completed grooming while standing at the sink with RW available with supervision assist. Pt continues to demonstrate R UE shoulder weakness which is slightly worse than yesterday, but pt is able to use R UE functionally without issue for today's tasks. Pt ambulated in the hall ~80 feet with the can and CGA. Somewhat unstable but pt reports feeling better than he did yesterday. Pt left in the chair with family present and call bell within reach. Pt will benefit from continued OT in the hospital and recommended venue below to increase strength, balance, and endurance for safe ADL's.         If plan is discharge home, recommend the following:  A little help with walking and/or transfers;A little help with bathing/dressing/bathroom;Assist for transportation;Help with stairs or ramp for entrance   Equipment Recommendations  None  recommended by OT          Precautions / Restrictions Precautions Precautions: Fall Recall of Precautions/Restrictions: Intact Restrictions Weight Bearing Restrictions Per Provider Order: No       Mobility Bed Mobility Overal bed mobility: Modified Independent Bed Mobility: Supine to Sit     Supine to sit: Modified independent (Device/Increase time)     General bed mobility comments: using bed railing    Transfers Overall transfer level: Needs assistance Equipment used: Rolling walker (2 wheels) Transfers: Sit to/from Stand, Bed to chair/wheelchair/BSC Sit to Stand: Contact guard assist, Supervision     Step pivot transfers: Supervision, Contact guard assist     General transfer comment: Mild labored effort for sit to stand from bed but no physical assist.     Balance Overall balance assessment: Needs assistance Sitting-balance support: Feet supported, No upper extremity supported Sitting balance-Leahy Scale: Good Sitting balance - Comments: seated at EOB   Standing balance support: Single extremity supported, During functional activity Standing balance-Leahy Scale: Fair Standing balance comment: using SPC                           ADL either performed or assessed with clinical judgement   ADL Overall ADL's : Needs assistance/impaired     Grooming: Supervision/safety;Standing;Wash/dry hands;Wash/dry face;Brushing hair Grooming Details (indicate cue type and reason): Pt able to stand within the RW at the sink for >3 minutes to complete grooming  tasks. Supervision assist for this.         Upper Body Dressing : Set up;Sitting;Modified independent Upper Body Dressing Details (indicate cue type and reason): Able to don gown with setup assit; doffing without assist. Completed in bed.     Toilet Transfer: Supervision/safety;Contact guard assist;Ambulation;Rolling walker (2 wheels) Toilet Transfer Details (indicate cue type and reason): EOB to  standing at the toilet to urinate with use of RW Toileting- Clothing Manipulation and Hygiene: Supervision/safety;Contact guard assist;Sit to/from stand Toileting - Clothing Manipulation Details (indicate cue type and reason): Pt urinated while standing without physical assist.     Functional mobility during ADLs: Contact guard assist;Rolling walker (2 wheels);Cane General ADL Comments: Pt ambualted within the room with CGA using RW. Pt able to ambualte ~80 feet in the hall with cane and CGA.    Extremity/Trunk Assessment Upper Extremity Assessment Upper Extremity Assessment: RUE deficits/detail RUE Deficits / Details: 4-/5 shoulder flexion and abduction; WFL otherwise.   Lower Extremity Assessment Lower Extremity Assessment: Defer to PT evaluation        Vision   Vision Assessment?: No apparent visual deficits Additional Comments: little to no nystagmus during visual tracking today.   Perception Perception Perception: Not tested   Praxis Praxis Praxis: Not tested   Communication Communication Communication: No apparent difficulties   Cognition Arousal: Alert Behavior During Therapy: WFL for tasks assessed/performed Cognition: No apparent impairments                               Following commands: Intact        Cueing   Cueing Techniques: Verbal cues                     Pertinent Vitals/ Pain       Pain Assessment Pain Assessment: No/denies pain                                                          Frequency  Min 2X/week        Progress Toward Goals  OT Goals(current goals can now be found in the care plan section)  Progress towards OT goals: Progressing toward goals  Acute Rehab OT Goals Patient Stated Goal: return home OT Goal Formulation: With patient/family Time For Goal Achievement: 09/10/23 Potential to Achieve Goals: Good ADL Goals Pt Will Perform Grooming: with modified independence Pt Will  Perform Lower Body Dressing: with modified independence Pt Will Transfer to Toilet: with modified independence Pt Will Perform Toileting - Clothing Manipulation and hygiene: with modified independence Pt/caregiver will Perform Home Exercise Program: Increased strength;Right Upper extremity;Independently  Plan                                      End of Session Equipment Utilized During Treatment: Rolling walker (2 wheels) (cane)  OT Visit Diagnosis: Unsteadiness on feet (R26.81);Other abnormalities of gait and mobility (R26.89);Muscle weakness (generalized) (M62.81);Other symptoms and signs involving the nervous system (R29.898)   Activity Tolerance Patient tolerated treatment well   Patient Left in chair;with call bell/phone within reach;with family/visitor present   Nurse Communication          Time: [id number]  OT Time Calculation (min): 30 min  Charges: OT General Charges $OT Visit: 1 Visit OT Treatments $Self Care/Home Management : 8-22 mins $Therapeutic Exercise: 8-22 mins  Jomarion Mish OT, MOT   Danie Chandler 08/28/2023, 9:30 AM

## 2023-08-28 NOTE — Plan of Care (Signed)

## 2023-08-29 DIAGNOSIS — I639 Cerebral infarction, unspecified: Secondary | ICD-10-CM

## 2023-08-29 DIAGNOSIS — I63441 Cerebral infarction due to embolism of right cerebellar artery: Secondary | ICD-10-CM | POA: Diagnosis not present

## 2023-08-29 NOTE — Progress Notes (Signed)
 Patient discharged home. All questions and concerns answered. Patient has all personal belongings including discharge paperwork. Patient left floor via wheelchair to POV.

## 2023-08-29 NOTE — Discharge Summary (Signed)
 Physician Discharge Summary  Carl Young ZOX:[id number] DOB: 1940/10/06 DOA: 08/26/2023  PCP: Ignatius Specking, MD  Admit date: 08/26/2023  Discharge date: 08/29/2023  Admitted From:Home  Disposition:  Home  Recommendations for Outpatient Follow-up:  Follow up with PCP in 1-2 weeks Follow up with [facility] in 3 months Remain on Eliquis 5mg  BID as prescribed Zio patch for 14 days Continue other home medications as prior and ASA discontinued  Home Health:Yes with PT/OT  Equipment/Devices:None  Discharge Condition:Stable  CODE STATUS: Full  Diet recommendation: Heart Healthy  Brief/Interim Summary: Mr.Speiser an 83 y/o with h/o HTN, CAD s/p CABG reports for the past several days has been feeling like his head was full and slightly dizzy. today he woke up feeling like this as well but was abmulatory, able to walk down his driveway to the mailbox.  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  He was started on Eliquis due to suspicion of PAF and had further evaluation per neurology on 2/27 with recommendations to obtain carotid ultrasound.  Minimal plaques were noted on carotid arteries and plan now is for discharge on Eliquis as prescribed and to have Zio patch for monitoring of any potential atrial fibrillation.  He will follow-up with his cardiologist outpatient as well as follow-up with neurology in the next 3 months as recommended.  He has been seen by PT with recommendations for home health services which has been arranged.  No other acute events or concerns noted and he is in stable condition for discharge.  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  Discharge Instructions  Discharge Instructions      Ambulatory referral to Neurology   Complete by: As directed    An appointment is requested in approximately: 8 weeks   Diet - low sodium heart healthy   Complete by: As directed    Diet - low sodium heart healthy   Complete by: As directed    Increase activity slowly   Complete by: As directed    Increase activity slowly   Complete by: As directed       Allergies as of 08/29/2023       Reactions   Ciprofloxacin In D5w Rash, Other (See Comments)   Pruritis         Medication List     STOP taking these medications    Aspirin Low Dose 81 MG tablet Generic drug: aspirin EC       TAKE these medications    acetaminophen 500 MG tablet Commonly known as: TYLENOL Take 500 mg by mouth every 6 (six) hours as needed for mild pain (pain score 1-3).   amLODipine 10 MG tablet Commonly known as: NORVASC TAKE 1 TABLET BY MOUTH DAILY   apixaban 5 MG Tabs tablet Commonly known as: ELIQUIS Take 1 tablet (5 mg total) by mouth 2 (two) times daily.   ascorbic acid 1000 MG tablet Commonly known as: VITAMIN C Take 1,000 mg by mouth daily.   BEET ROOT PO Take 1 capsule by mouth daily.   benazepril 40 MG tablet Commonly known as: LOTENSIN Take 40 mg by mouth daily.   carboxymethylcellulose 0.5 % Soln Commonly known as: REFRESH PLUS Place 1 drop into both eyes daily as needed (dry eyes).  fenofibrate 160 MG tablet Take 1 tablet (160 mg total) by mouth daily. What changed:  medication strength how much to take when to take this   fish oil-omega-3 fatty acids 1000 MG capsule Take 1 g by mouth in the morning and at bedtime.   folic acid 400 MCG tablet Commonly known as: FOLVITE Take 400 mcg by mouth daily.   Garlic 1000 MG Caps Take 1,000 mg by mouth at bedtime.   Lysine 500 MG Caps Take 500 mg by mouth daily.   metoprolol succinate 50 MG 24 hr tablet Commonly known as: TOPROL-XL Take 25 mg by mouth 2 (two) times daily.   multivitamin with minerals Tabs  tablet Take 1 tablet by mouth daily.   PRESERVISION AREDS 2+MULTI VIT PO Take 1 capsule by mouth 2 (two) times daily.   rosuvastatin 40 MG tablet Commonly known as: CRESTOR Take 1 tablet (40 mg total) by mouth daily.   Vitamin D3 50 MCG (2000 UT) capsule Take 2,000 Units by mouth every evening.   zinc gluconate 50 MG tablet Take 50 mg by mouth every evening.               Durable Medical Equipment  (From admission, onward)           Start     Ordered   08/27/23 1223  For home use only DME Walker rolling  Once       Question Answer Comment  Walker: With 5 Inch Wheels   Patient needs a walker to treat with the following condition Stroke [facility])      08/27/23 1222            Follow-up Information     [facility] at [facility] Follow up.   Specialty: Cardiology Why: Cardiology office will mail you a heart monitor to wear for 14 days. It will come with instructions on how to apply and mail back. You can call the monitor company or this office if you have any questions. Contact information: [address] [place] [address] [phone]        Ignatius Specking, MD. Schedule an appointment as soon as possible for a visit in 1 week(s).   Specialty: Internal Medicine Contact information: [address] [place] [address] [phone]         [facility]. Go to.   Contact information: [address]     [address] [place] [address] [phone]               Allergies  Allergen Reactions   Ciprofloxacin In D5w Rash and Other (See Comments)    Pruritis     Consultations: Neurology   Procedures/Studies: US Carotid Bilateral Result Date: 08/29/2023 CLINICAL DATA:  Stroke EXAM: BILATERAL CAROTID DUPLEX ULTRASOUND TECHNIQUE: Wallace Cullens scale imaging, color Doppler and duplex ultrasound were performed of bilateral carotid and vertebral arteries in the neck. COMPARISON:  None Available.  FINDINGS: Criteria: Quantification of carotid stenosis is based on velocity parameters that correlate the residual internal carotid diameter with NASCET-based stenosis levels, using the diameter of the distal internal carotid lumen as the denominator for stenosis measurement. The following velocity measurements were obtained: RIGHT ICA: 69/12 cm/sec CCA: 74/8 cm/sec SYSTOLIC ICA/CCA RATIO:  0.9 ECA:  95 cm/sec LEFT ICA: 72/11 cm/sec CCA: 98/13 cm/sec SYSTOLIC ICA/CCA RATIO:  0.7 ECA:  163 cm/sec RIGHT CAROTID ARTERY: Heterogeneous atherosclerotic plaque throughout the common carotid artery extending into the proximal internal carotid artery.  By peak systolic velocity criteria, the estimated stenosis remains less than 50%. RIGHT VERTEBRAL ARTERY:  Patent with normal antegrade flow. LEFT CAROTID ARTERY: Heterogeneous atherosclerotic plaque in the carotid bifurcation and proximal internal carotid artery. By peak systolic velocity criteria, the estimated stenosis remains less than 50%. LEFT VERTEBRAL ARTERY:  Patent with normal antegrade flow. IMPRESSION: 1. Mild (1-49%) stenosis proximal right internal carotid artery secondary to heterogenous atherosclerotic plaque. 2. Mild (1-49%) stenosis proximal right internal carotid artery secondary to heterogenous atherosclerotic plaque. 3. Vertebral arteries are patent with normal antegrade flow. Electronically Signed   By: Malachy Moan M.D.   On: 08/29/2023 09:12   ECHOCARDIOGRAM COMPLETE Result Date: 08/27/2023    ECHOCARDIOGRAM REPORT   Patient Name:   JAYDENCE ARNESEN Date of Exam: 08/27/2023 Medical Rec #:  [id number]       Height:       71.0 in Accession #:    [id number]      Weight:       207.0 lb Date of Birth:  28-Jun-1941       BSA:          2.140 m Patient Age:    83 years        BP:           135/67 mmHg Patient Gender: M               HR:           79 bpm. [facility] Procedure: 2D Echo, Cardiac Doppler and Color Doppler (Both Spectral and Color             Flow Doppler were utilized during procedure). Indications:    Stroke  History:        Patient has prior history of Echocardiogram examinations, most                 recent 04/01/2023. CAD, Prior CABG, Stroke, Arrythmias:Atrial                 Fibrillation; Risk Factors:Hypertension, Dyslipidemia and Former                 Smoker. CKD.  Sonographer:    Vern Claude Referring Phys: [id number] ERIC CHEN IMPRESSIONS  1. Left ventricular ejection fraction, by estimation, is 60 to 65%. The left ventricle has normal function. The left ventricle has no regional wall motion abnormalities. There is mild concentric left ventricular hypertrophy. Left ventricular diastolic parameters are consistent with Grade I diastolic dysfunction (impaired relaxation).  2. Right ventricular systolic function is normal. The right ventricular size is normal. Tricuspid regurgitation signal is inadequate for assessing PA pressure.  3. The mitral valve is degenerative. Trivial mitral valve regurgitation.  4. The aortic valve has an indeterminant number of cusps. There is moderate calcification of the aortic valve. Aortic valve stenosis is moderate to severe, likely paradoxical normal flow/low gradient. Aortic valve mean gradient measures 16.4 mmHg. Dimentionless index 0.30.  5. Aortic dilatation noted. There is borderline dilatation of the aortic root, measuring 39 mm. There is mild dilatation of the ascending aorta, measuring 38 mm.  6. Unable to estimate CVP. Comparison(s): Prior images reviewed side by side. Moderate to severe paradoxical normal flow/low gradient aortic stenosis with mean AV gradient only 16 mmHg and DI 0.30. FINDINGS  Left Ventricle: Left ventricular ejection fraction, by estimation, is 60 to 65%. The left ventricle has normal function. The left ventricle has no regional wall motion abnormalities. Strain imaging was  not performed. The left ventricular internal cavity  size was normal in size. There is mild concentric left  ventricular hypertrophy. Left ventricular diastolic parameters are consistent with Grade I diastolic dysfunction (impaired relaxation). Right Ventricle: The right ventricular size is normal. No increase in right ventricular wall thickness. Right ventricular systolic function is normal. Tricuspid regurgitation signal is inadequate for assessing PA pressure. Left Atrium: Left atrial size was normal in size. Right Atrium: Right atrial size was normal in size. Pericardium: There is no evidence of pericardial effusion. Presence of epicardial fat layer. Mitral Valve: The mitral valve is degenerative in appearance. There is mild thickening of the mitral valve leaflet(s). There is mild calcification of the mitral valve leaflet(s). Mild mitral annular calcification. Trivial mitral valve regurgitation. MV peak gradient, 2.9 mmHg. The mean mitral valve gradient is 1.0 mmHg. Tricuspid Valve: The tricuspid valve is grossly normal. Tricuspid valve regurgitation is trivial. Aortic Valve: The aortic valve has an indeterminant number of cusps. There is moderate calcification of the aortic valve. There is moderate aortic valve annular calcification. Aortic valve regurgitation is trivial. Moderate to severe aortic stenosis is present. Aortic valve mean gradient measures 16.4 mmHg. Aortic valve peak gradient measures 28.2 mmHg. Aortic valve area, by VTI measures 0.93 cm. Pulmonic Valve: The pulmonic valve was grossly normal. Pulmonic valve regurgitation is trivial. Aorta: Aortic dilatation noted. There is borderline dilatation of the aortic root, measuring 39 mm. There is mild dilatation of the ascending aorta, measuring 38 mm. Venous: Unable to estimate CVP. The inferior vena cava was not well visualized. IAS/Shunts: The interatrial septum was not well visualized. Additional Comments: 3D imaging was not performed.  LEFT VENTRICLE PLAX 2D LVIDd:         4.50 cm      Diastology LVIDs:         2.80 cm      LV e' medial:    6.20 cm/s LV  PW:         1.00 cm      LV E/e' medial:  10.2 LV IVS:        1.20 cm      LV e' lateral:   7.94 cm/s LVOT diam:     2.00 cm      LV E/e' lateral: 8.0 LV SV:         49 LV SV Index:   23 LVOT Area:     3.14 cm  LV Volumes (MOD) LV vol d, MOD A2C: 143.0 ml LV vol d, MOD A4C: 116.0 ml LV vol s, MOD A2C: 52.8 ml LV vol s, MOD A4C: 42.7 ml LV SV MOD A2C:     90.2 ml LV SV MOD A4C:     116.0 ml LV SV MOD BP:      87.1 ml RIGHT VENTRICLE RV Basal diam:  2.40 cm RV Mid diam:    1.50 cm RV S prime:     8.16 cm/s TAPSE (M-mode): 1.6 cm LEFT ATRIUM             Index        RIGHT ATRIUM           Index LA diam:        2.70 cm 1.26 cm/m   RA Area:     11.70 cm LA Vol (A2C):   51.3 ml 23.98 ml/m  RA Volume:   19.90 ml  9.30 ml/m LA Vol (A4C):   39.1 ml 18.27 ml/m LA Biplane Vol: 44.8 ml  20.94 ml/m  AORTIC VALVE                     PULMONIC VALVE AV Area (Vmax):    1.00 cm      PV Vmax:       1.01 m/s AV Area (Vmean):   1.00 cm      PV Peak grad:  4.1 mmHg AV Area (VTI):     0.93 cm AV Vmax:           265.40 cm/s AV Vmean:          189.600 cm/s AV VTI:            0.527 m AV Peak Grad:      28.2 mmHg AV Mean Grad:      16.4 mmHg LVOT Vmax:         84.20 cm/s LVOT Vmean:        60.100 cm/s LVOT VTI:          0.156 m LVOT/AV VTI ratio: 0.30  AORTA Ao Root diam: 3.90 cm Ao Asc diam:  3.80 cm MITRAL VALVE MV Area (PHT): 4.12 cm    SHUNTS MV Area VTI:   3.66 cm    Systemic VTI:  0.16 m MV Peak grad:  2.9 mmHg    Systemic Diam: 2.00 cm MV Mean grad:  1.0 mmHg MV Vmax:       0.85 m/s MV Vmean:      54.3 cm/s MV Decel Time: 184 msec MV E velocity: 63.40 cm/s MV A velocity: 86.10 cm/s MV E/A ratio:  0.74 Nona Dell MD Electronically signed by Nona Dell MD Signature Date/Time: 08/27/2023/1:09:11 PM    Final    MR BRAIN WO CONTRAST Result Date: 08/26/2023 CLINICAL DATA:  Initial evaluation for acute neuro deficit, stroke suspected. EXAM: MRI HEAD WITHOUT CONTRAST MRA HEAD WITHOUT CONTRAST TECHNIQUE: Multiplanar,  multi-echo pulse sequences of the brain and surrounding structures were acquired without intravenous contrast. Angiographic images of the Circle of Willis were acquired using MRA technique without intravenous contrast. COMPARISON:  CT from earlier the same day. FINDINGS: MRI HEAD FINDINGS Brain: Cerebral volume within normal limits. Patchy T2/FLAIR hyperintensity involving the periventricular deep white matter both cerebral hemispheres, consistent with chronic small vessel ischemic disease, moderate in nature. Patchy foci of restricted diffusion involving the right temporal occipital junction and right thalamus, consistent with a small acute to early subacute right PCA distribution infarct (series 6, images 19, 22). No associated hemorrhage or mass effect. No other evidence for acute or subacute ischemia. No acute intracranial hemorrhage. Few chronic micro hemorrhages noted at the left frontal and right temporal lobes. No mass lesion, midline shift or mass effect. No hydrocephalus or extra-axial fluid collection. Pituitary gland within normal limits. Vascular: Major intracranial vascular flow voids are maintained. Skull and upper cervical spine: Craniocervical junction within normal limits. Bone marrow signal intensity normal. No scalp soft tissue abnormality. Sinuses/Orbits: Prior bilateral ocular lens replacement. Paranasal sinuses are clear. No significant mastoid effusion. Other: None. MRA HEAD FINDINGS Anterior circulation: Examination mildly degraded by motion artifact. Both internal carotid arteries are patent through the siphons without hemodynamically significant stenosis. A1 segments patent bilaterally. Normal anterior communicating artery complex. Evaluation of the proximal ACAs mildly limited by motion. Visualized ACAs patent to their distal aspects without convincing stenosis. No significant M1 stenosis. No proximal MCA branch occlusion or high-grade stenosis. Distal MCA branches perfused and fairly  symmetric. Posterior circulation: Visualized distal V4 segments patent without  stenosis. Both PICA patent. Basilar patent without significant stenosis. Superior cerebral arteries patent bilaterally. Right PCA supplied via the basilar. Left PCA supplied via a hypoplastic left P1 segment and robust left posterior communicating artery. Both PCAs patent to their distal aspects without significant stenosis. Anatomic variants: As above.  No aneurysm. IMPRESSION: MRI HEAD: 1. Patchy small volume acute to early subacute right PCA distribution infarcts involving the right temporoccipital junction and right thalamus. No associated hemorrhage or mass effect. 2. Underlying moderate chronic microvascular ischemic disease. MRA HEAD: Negative intracranial MRA for large vessel occlusion. No hemodynamically significant or correctable stenosis. Electronically Signed   By: Rise Mu M.D.   On: 08/26/2023 19:04   MR ANGIO HEAD WO CONTRAST Result Date: 08/26/2023 CLINICAL DATA:  Initial evaluation for acute neuro deficit, stroke suspected. EXAM: MRI HEAD WITHOUT CONTRAST MRA HEAD WITHOUT CONTRAST TECHNIQUE: Multiplanar, multi-echo pulse sequences of the brain and surrounding structures were acquired without intravenous contrast. Angiographic images of the Circle of Willis were acquired using MRA technique without intravenous contrast. COMPARISON:  CT from earlier the same day. FINDINGS: MRI HEAD FINDINGS Brain: Cerebral volume within normal limits. Patchy T2/FLAIR hyperintensity involving the periventricular deep white matter both cerebral hemispheres, consistent with chronic small vessel ischemic disease, moderate in nature. Patchy foci of restricted diffusion involving the right temporal occipital junction and right thalamus, consistent with a small acute to early subacute right PCA distribution infarct (series 6, images 19, 22). No associated hemorrhage or mass effect. No other evidence for acute or subacute ischemia.  No acute intracranial hemorrhage. Few chronic micro hemorrhages noted at the left frontal and right temporal lobes. No mass lesion, midline shift or mass effect. No hydrocephalus or extra-axial fluid collection. Pituitary gland within normal limits. Vascular: Major intracranial vascular flow voids are maintained. Skull and upper cervical spine: Craniocervical junction within normal limits. Bone marrow signal intensity normal. No scalp soft tissue abnormality. Sinuses/Orbits: Prior bilateral ocular lens replacement. Paranasal sinuses are clear. No significant mastoid effusion. Other: None. MRA HEAD FINDINGS Anterior circulation: Examination mildly degraded by motion artifact. Both internal carotid arteries are patent through the siphons without hemodynamically significant stenosis. A1 segments patent bilaterally. Normal anterior communicating artery complex. Evaluation of the proximal ACAs mildly limited by motion. Visualized ACAs patent to their distal aspects without convincing stenosis. No significant M1 stenosis. No proximal MCA branch occlusion or high-grade stenosis. Distal MCA branches perfused and fairly symmetric. Posterior circulation: Visualized distal V4 segments patent without stenosis. Both PICA patent. Basilar patent without significant stenosis. Superior cerebral arteries patent bilaterally. Right PCA supplied via the basilar. Left PCA supplied via a hypoplastic left P1 segment and robust left posterior communicating artery. Both PCAs patent to their distal aspects without significant stenosis. Anatomic variants: As above.  No aneurysm. IMPRESSION: MRI HEAD: 1. Patchy small volume acute to early subacute right PCA distribution infarcts involving the right temporoccipital junction and right thalamus. No associated hemorrhage or mass effect. 2. Underlying moderate chronic microvascular ischemic disease. MRA HEAD: Negative intracranial MRA for large vessel occlusion. No hemodynamically significant or  correctable stenosis. Electronically Signed   By: Rise Mu M.D.   On: 08/26/2023 19:04   CT Head Wo Contrast Result Date: 08/26/2023 CLINICAL DATA:  Head trauma. EXAM: CT HEAD WITHOUT CONTRAST TECHNIQUE: Contiguous axial images were obtained from the base of the skull through the vertex without intravenous contrast. RADIATION DOSE REDUCTION: This exam was performed according to the departmental dose-optimization program which includes automated exposure control, adjustment of the mA  and/or kV according to patient size and/or use of iterative reconstruction technique. COMPARISON:  None Available. FINDINGS: Brain: Mild age-related atrophy and chronic microvascular ischemic changes. There is no acute intracranial hemorrhage. No mass effect or midline shift. No extra-axial fluid collection. Vascular: No hyperdense vessel or unexpected calcification. Skull: Normal. Negative for fracture or focal lesion. Sinuses/Orbits: No acute finding. Other: None IMPRESSION: 1. No acute intracranial pathology. 2. Mild age-related atrophy and chronic microvascular ischemic changes. Electronically Signed   By: Elgie Collard M.D.   On: 08/26/2023 17:43     Discharge Exam: Vitals:   08/29/23 0355 08/29/23 0800  BP: 126/65 130/79  Pulse: 71 66  Resp: 16   Temp: 97.9 F (36.6 C) 97.7 F (36.5 C)  SpO2: 90% 99%   Vitals:   08/28/23 1521 08/28/23 2201 08/29/23 0355 08/29/23 0800  BP: (!) 147/78 137/75 126/65 130/79  Pulse: 78 78 71 66  Resp: 18  16   Temp: 98.3 F (36.8 C) 98.2 F (36.8 C) 97.9 F (36.6 C) 97.7 F (36.5 C)  TempSrc: Oral Oral Oral Oral  SpO2: 93% 92% 90% 99%  Weight:      Height:        General: Pt is alert, awake, not in acute distress Cardiovascular: RRR, S1/S2 +, no rubs, no gallops Respiratory: CTA bilaterally, no wheezing, no rhonchi Abdominal: Soft, NT, ND, bowel sounds + Extremities: no edema, no cyanosis    The results of significant diagnostics from this  hospitalization (including imaging, microbiology, ancillary and laboratory) are listed below for reference.     Microbiology: Recent Results (from the past 240 hours)  Resp panel by RT-PCR (RSV, Flu A&B, Covid) Anterior Nasal Swab     Status: None   Collection Time: 08/26/23  6:56 PM   Specimen: Anterior Nasal Swab  Result Value Ref Range Status   SARS Coronavirus 2 by RT PCR NEGATIVE NEGATIVE Final    Comment: (NOTE) SARS-CoV-2 target nucleic acids are NOT DETECTED.  The SARS-CoV-2 RNA is generally detectable in upper respiratory specimens during the acute phase of infection. The lowest concentration of SARS-CoV-2 viral copies this assay can detect is 138 copies/mL. A negative result does not preclude SARS-Cov-2 infection and should not be used as the sole basis for treatment or other patient management decisions. A negative result may occur with  improper specimen collection/handling, submission of specimen other than nasopharyngeal swab, presence of viral mutation(s) within the areas targeted by this assay, and inadequate number of viral copies(<138 copies/mL). A negative result must be combined with clinical observations, patient history, and epidemiological information. The expected result is Negative.  Fact Sheet for Patients:  BloggerCourse.com  Fact Sheet for Healthcare Providers:  SeriousBroker.it  This test is no t yet approved or cleared by the [place] FDA and  has been authorized for detection and/or diagnosis of SARS-CoV-2 by FDA under an Emergency Use Authorization (EUA). This EUA will remain  in effect (meaning this test can be used) for the duration of the COVID-19 declaration under Section 564(b)(1) of the Act, 21 U.S.C.section 360bbb-3(b)(1), unless the authorization is terminated  or revoked sooner.       Influenza A by PCR NEGATIVE NEGATIVE Final   Influenza B by PCR NEGATIVE NEGATIVE Final     Comment: (NOTE) The Xpert Xpress SARS-CoV-2/FLU/RSV plus assay is intended as an aid in the diagnosis of influenza from Nasopharyngeal swab specimens and should not be used as a sole basis for treatment. Nasal washings and aspirates are  unacceptable for Xpert Xpress SARS-CoV-2/FLU/RSV testing.  Fact Sheet for Patients: BloggerCourse.com  Fact Sheet for Healthcare Providers: SeriousBroker.it  This test is not yet approved or cleared by the [place] FDA and has been authorized for detection and/or diagnosis of SARS-CoV-2 by FDA under an Emergency Use Authorization (EUA). This EUA will remain in effect (meaning this test can be used) for the duration of the COVID-19 declaration under Section 564(b)(1) of the Act, 21 U.S.C. section 360bbb-3(b)(1), unless the authorization is terminated or revoked.     Resp Syncytial Virus by PCR NEGATIVE NEGATIVE Final    Comment: (NOTE) Fact Sheet for Patients: BloggerCourse.com  Fact Sheet for Healthcare Providers: SeriousBroker.it  This test is not yet approved or cleared by the [place] FDA and has been authorized for detection and/or diagnosis of SARS-CoV-2 by FDA under an Emergency Use Authorization (EUA). This EUA will remain in effect (meaning this test can be used) for the duration of the COVID-19 declaration under Section 564(b)(1) of the Act, 21 U.S.C. section 360bbb-3(b)(1), unless the authorization is terminated or revoked.  Performed at [facility], [address] [place], [place] [address]      Labs: BNP (last 3 results) No results for input(s): "BNP" in the last 8760 hours. Basic Metabolic Panel: Recent Labs  Lab 08/26/23 1456  NA 141  K 4.1  CL 104  CO2 24  GLUCOSE 90  BUN 23  CREATININE 1.21  CALCIUM 9.9   Liver Function Tests: No results for input(s): "AST", "ALT", "ALKPHOS", "BILITOT", "PROT",  "ALBUMIN" in the last 168 hours. No results for input(s): "LIPASE", "AMYLASE" in the last 168 hours. No results for input(s): "AMMONIA" in the last 168 hours. CBC: Recent Labs  Lab 08/26/23 1456  WBC 6.6  NEUTROABS 3.8  HGB 15.0  HCT 45.6  MCV 95.2  PLT 173   Cardiac Enzymes: No results for input(s): "CKTOTAL", "CKMB", "CKMBINDEX", "TROPONINI" in the last 168 hours. BNP: Invalid input(s): "POCBNP" CBG: No results for input(s): "GLUCAP" in the last 168 hours. D-Dimer No results for input(s): "DDIMER" in the last 72 hours. Hgb A1c Recent Labs    08/26/23 1456  HGBA1C 6.2*   Lipid Profile Recent Labs    08/27/23 0357  CHOL 135  HDL 23*  LDLCALC 36  TRIG 409*  CHOLHDL 5.9   Thyroid function studies No results for input(s): "TSH", "T4TOTAL", "T3FREE", "THYROIDAB" in the last 72 hours.  Invalid input(s): "FREET3" Anemia work up No results for input(s): "VITAMINB12", "FOLATE", "FERRITIN", "TIBC", "IRON", "RETICCTPCT" in the last 72 hours. Urinalysis    Component Value Date/Time   COLORURINE YELLOW 08/26/2023 1856   APPEARANCEUR CLEAR 08/26/2023 1856   LABSPEC 1.013 08/26/2023 1856   PHURINE 7.0 08/26/2023 1856   GLUCOSEU NEGATIVE 08/26/2023 1856   HGBUR NEGATIVE 08/26/2023 1856   BILIRUBINUR NEGATIVE 08/26/2023 1856   KETONESUR NEGATIVE 08/26/2023 1856   PROTEINUR 100 (A) 08/26/2023 1856   UROBILINOGEN 0.2 03/27/2015 1152   NITRITE NEGATIVE 08/26/2023 1856   LEUKOCYTESUR NEGATIVE 08/26/2023 1856   Sepsis Labs Recent Labs  Lab 08/26/23 1456  WBC 6.6   Microbiology Recent Results (from the past 240 hours)  Resp panel by RT-PCR (RSV, Flu A&B, Covid) Anterior Nasal Swab     Status: None   Collection Time: 08/26/23  6:56 PM   Specimen: Anterior Nasal Swab  Result Value Ref Range Status   SARS Coronavirus 2 by RT PCR NEGATIVE NEGATIVE Final    Comment: (NOTE) SARS-CoV-2 target nucleic acids are NOT  DETECTED.  The SARS-CoV-2 RNA is generally detectable in  upper respiratory specimens during the acute phase of infection. The lowest concentration of SARS-CoV-2 viral copies this assay can detect is 138 copies/mL. A negative result does not preclude SARS-Cov-2 infection and should not be used as the sole basis for treatment or other patient management decisions. A negative result may occur with  improper specimen collection/handling, submission of specimen other than nasopharyngeal swab, presence of viral mutation(s) within the areas targeted by this assay, and inadequate number of viral copies(<138 copies/mL). A negative result must be combined with clinical observations, patient history, and epidemiological information. The expected result is Negative.  Fact Sheet for Patients:  BloggerCourse.com  Fact Sheet for Healthcare Providers:  SeriousBroker.it  This test is no t yet approved or cleared by the [place] FDA and  has been authorized for detection and/or diagnosis of SARS-CoV-2 by FDA under an Emergency Use Authorization (EUA). This EUA will remain  in effect (meaning this test can be used) for the duration of the COVID-19 declaration under Section 564(b)(1) of the Act, 21 U.S.C.section 360bbb-3(b)(1), unless the authorization is terminated  or revoked sooner.       Influenza A by PCR NEGATIVE NEGATIVE Final   Influenza B by PCR NEGATIVE NEGATIVE Final    Comment: (NOTE) The Xpert Xpress SARS-CoV-2/FLU/RSV plus assay is intended as an aid in the diagnosis of influenza from Nasopharyngeal swab specimens and should not be used as a sole basis for treatment. Nasal washings and aspirates are unacceptable for Xpert Xpress SARS-CoV-2/FLU/RSV testing.  Fact Sheet for Patients: BloggerCourse.com  Fact Sheet for Healthcare Providers: SeriousBroker.it  This test is not yet approved or cleared by the [place] FDA and has been  authorized for detection and/or diagnosis of SARS-CoV-2 by FDA under an Emergency Use Authorization (EUA). This EUA will remain in effect (meaning this test can be used) for the duration of the COVID-19 declaration under Section 564(b)(1) of the Act, 21 U.S.C. section 360bbb-3(b)(1), unless the authorization is terminated or revoked.     Resp Syncytial Virus by PCR NEGATIVE NEGATIVE Final    Comment: (NOTE) Fact Sheet for Patients: BloggerCourse.com  Fact Sheet for Healthcare Providers: SeriousBroker.it  This test is not yet approved or cleared by the [place] FDA and has been authorized for detection and/or diagnosis of SARS-CoV-2 by FDA under an Emergency Use Authorization (EUA). This EUA will remain in effect (meaning this test can be used) for the duration of the COVID-19 declaration under Section 564(b)(1) of the Act, 21 U.S.C. section 360bbb-3(b)(1), unless the authorization is terminated or revoked.  Performed at [facility], [address], [place], [place] [address]      Time coordinating discharge: 35 minutes  SIGNED:   Erick Blinks, DO [facility] 08/29/2023, 9:49 AM  If 7PM-7AM, please contact night-coverage [email or web address]

## 2023-08-29 NOTE — Care Plan (Signed)
 Reviewed carotid ultrasound.  Mild atherosclerotic plaques bilaterally without significant stenosis.  After discussing with patient, cardiology and stroke neurology ( Dr Roda Shutters), will continue Eliquis 5 mg twice daily and plan for cardiac monitor.  Discussed plan with Dr. Sherryll Burger as well.  Carl Young Carl Young

## 2023-08-29 NOTE — Care Management Important Message (Signed)
 Important Message  Patient Details  Name: Carl Young MRN: [id number] Date of Birth: 06-03-1941   Important Message Given:  Yes - Medicare IM     Corey Harold 08/29/2023, 11:30 AM

## 2023-08-29 NOTE — Plan of Care (Signed)
  Problem: Education: Goal: Knowledge of disease or condition will improve Outcome: Progressing Goal: Knowledge of secondary prevention will improve (MUST DOCUMENT ALL) Outcome: Progressing Goal: Knowledge of patient specific risk factors will improve (DELETE if not current risk factor) Outcome: Progressing   Problem: Ischemic Stroke/TIA Tissue Perfusion: Goal: Complications of ischemic stroke/TIA will be minimized Outcome: Progressing   Problem: Coping: Goal: Will verbalize positive feelings about self Outcome: Progressing Goal: Will identify appropriate support needs Outcome: Progressing   Problem: Health Behavior/Discharge Planning: Goal: Ability to manage health-related needs will improve Outcome: Progressing Goal: Goals will be collaboratively established with patient/family Outcome: Progressing   Problem: Self-Care: Goal: Ability to participate in self-care as condition permits will improve Outcome: Progressing Goal: Verbalization of feelings and concerns over difficulty with self-care will improve Outcome: Progressing Goal: Ability to communicate needs accurately will improve Outcome: Progressing

## 2023-08-30 DIAGNOSIS — I480 Paroxysmal atrial fibrillation: Secondary | ICD-10-CM | POA: Diagnosis not present

## 2023-08-30 DIAGNOSIS — I69351 Hemiplegia and hemiparesis following cerebral infarction affecting right dominant side: Secondary | ICD-10-CM | POA: Diagnosis not present

## 2023-08-30 DIAGNOSIS — I2510 Atherosclerotic heart disease of native coronary artery without angina pectoris: Secondary | ICD-10-CM | POA: Diagnosis not present

## 2023-08-30 DIAGNOSIS — Z556 Problems related to health literacy: Secondary | ICD-10-CM | POA: Diagnosis not present

## 2023-08-30 DIAGNOSIS — Z951 Presence of aortocoronary bypass graft: Secondary | ICD-10-CM | POA: Diagnosis not present

## 2023-08-30 DIAGNOSIS — E782 Mixed hyperlipidemia: Secondary | ICD-10-CM | POA: Diagnosis not present

## 2023-08-30 DIAGNOSIS — N183 Chronic kidney disease, stage 3 unspecified: Secondary | ICD-10-CM | POA: Diagnosis not present

## 2023-08-30 DIAGNOSIS — Z9181 History of falling: Secondary | ICD-10-CM | POA: Diagnosis not present

## 2023-08-30 DIAGNOSIS — I129 Hypertensive chronic kidney disease with stage 1 through stage 4 chronic kidney disease, or unspecified chronic kidney disease: Secondary | ICD-10-CM | POA: Diagnosis not present

## 2023-08-30 DIAGNOSIS — Z7901 Long term (current) use of anticoagulants: Secondary | ICD-10-CM | POA: Diagnosis not present

## 2023-08-30 DIAGNOSIS — I081 Rheumatic disorders of both mitral and tricuspid valves: Secondary | ICD-10-CM | POA: Diagnosis not present

## 2023-08-30 DIAGNOSIS — Z87891 Personal history of nicotine dependence: Secondary | ICD-10-CM | POA: Diagnosis not present

## 2023-08-30 DIAGNOSIS — G510 Bell's palsy: Secondary | ICD-10-CM | POA: Diagnosis not present

## 2023-09-01 NOTE — Consult Note (Signed)
 [facility] Liaison Note  09/01/2023  Carl Young Sep 02, 1940 [id number]  Location: RN Hospital Liaison screened the patient remotely at [facility].  Insurance: [employer]   Carl Young is a 83 y.o. male who is a Primary Care Patient of Vyas, Angelina Pih, MD [facility] Internal Medicine. The patient was screened for readmission hospitalization with noted low risk score for unplanned readmission risk with 1 IP in 6 months.  The patient was assessed for potential Care Management service needs for post hospital transition for care coordination. Review of patient's electronic medical record reveals patient was admitted for CVA. Pt discharged home with [facility] with [facility] with a supportive spouse. No anticipated needs via VBCI at this time. This provider will provide post hospital discharged follow up services. No anticipated needs presented at this time.   VBCI Care Management/Population Health does not replace or interfere with any arrangements made by the Inpatient Transition of Care team.   For questions contact:   Elliot Cousin, RN, BSN Hospital Liaison [place]   [facility], Population Health Office Hours MTWF  8:00 am-6:00 pm Direct Dial: [phone] mobile [email or web address]

## 2023-09-02 DIAGNOSIS — Z87891 Personal history of nicotine dependence: Secondary | ICD-10-CM | POA: Diagnosis not present

## 2023-09-02 DIAGNOSIS — Z7901 Long term (current) use of anticoagulants: Secondary | ICD-10-CM | POA: Diagnosis not present

## 2023-09-02 DIAGNOSIS — I081 Rheumatic disorders of both mitral and tricuspid valves: Secondary | ICD-10-CM | POA: Diagnosis not present

## 2023-09-02 DIAGNOSIS — E782 Mixed hyperlipidemia: Secondary | ICD-10-CM | POA: Diagnosis not present

## 2023-09-02 DIAGNOSIS — I69351 Hemiplegia and hemiparesis following cerebral infarction affecting right dominant side: Secondary | ICD-10-CM | POA: Diagnosis not present

## 2023-09-02 DIAGNOSIS — Z556 Problems related to health literacy: Secondary | ICD-10-CM | POA: Diagnosis not present

## 2023-09-02 DIAGNOSIS — Z9181 History of falling: Secondary | ICD-10-CM | POA: Diagnosis not present

## 2023-09-02 DIAGNOSIS — N183 Chronic kidney disease, stage 3 unspecified: Secondary | ICD-10-CM | POA: Diagnosis not present

## 2023-09-02 DIAGNOSIS — I2510 Atherosclerotic heart disease of native coronary artery without angina pectoris: Secondary | ICD-10-CM | POA: Diagnosis not present

## 2023-09-02 DIAGNOSIS — G510 Bell's palsy: Secondary | ICD-10-CM | POA: Diagnosis not present

## 2023-09-02 DIAGNOSIS — I480 Paroxysmal atrial fibrillation: Secondary | ICD-10-CM | POA: Diagnosis not present

## 2023-09-02 DIAGNOSIS — Z951 Presence of aortocoronary bypass graft: Secondary | ICD-10-CM | POA: Diagnosis not present

## 2023-09-02 DIAGNOSIS — I129 Hypertensive chronic kidney disease with stage 1 through stage 4 chronic kidney disease, or unspecified chronic kidney disease: Secondary | ICD-10-CM | POA: Diagnosis not present

## 2023-09-04 DIAGNOSIS — L97521 Non-pressure chronic ulcer of other part of left foot limited to breakdown of skin: Secondary | ICD-10-CM | POA: Diagnosis not present

## 2023-09-05 DIAGNOSIS — I69351 Hemiplegia and hemiparesis following cerebral infarction affecting right dominant side: Secondary | ICD-10-CM | POA: Diagnosis not present

## 2023-09-05 DIAGNOSIS — G510 Bell's palsy: Secondary | ICD-10-CM | POA: Diagnosis not present

## 2023-09-05 DIAGNOSIS — I129 Hypertensive chronic kidney disease with stage 1 through stage 4 chronic kidney disease, or unspecified chronic kidney disease: Secondary | ICD-10-CM | POA: Diagnosis not present

## 2023-09-05 DIAGNOSIS — E782 Mixed hyperlipidemia: Secondary | ICD-10-CM | POA: Diagnosis not present

## 2023-09-05 DIAGNOSIS — I081 Rheumatic disorders of both mitral and tricuspid valves: Secondary | ICD-10-CM | POA: Diagnosis not present

## 2023-09-05 DIAGNOSIS — Z7901 Long term (current) use of anticoagulants: Secondary | ICD-10-CM | POA: Diagnosis not present

## 2023-09-05 DIAGNOSIS — Z951 Presence of aortocoronary bypass graft: Secondary | ICD-10-CM | POA: Diagnosis not present

## 2023-09-05 DIAGNOSIS — Z87891 Personal history of nicotine dependence: Secondary | ICD-10-CM | POA: Diagnosis not present

## 2023-09-05 DIAGNOSIS — I480 Paroxysmal atrial fibrillation: Secondary | ICD-10-CM | POA: Diagnosis not present

## 2023-09-05 DIAGNOSIS — Z556 Problems related to health literacy: Secondary | ICD-10-CM | POA: Diagnosis not present

## 2023-09-05 DIAGNOSIS — N183 Chronic kidney disease, stage 3 unspecified: Secondary | ICD-10-CM | POA: Diagnosis not present

## 2023-09-05 DIAGNOSIS — Z9181 History of falling: Secondary | ICD-10-CM | POA: Diagnosis not present

## 2023-09-05 DIAGNOSIS — I2510 Atherosclerotic heart disease of native coronary artery without angina pectoris: Secondary | ICD-10-CM | POA: Diagnosis not present

## 2023-09-08 DIAGNOSIS — Z9181 History of falling: Secondary | ICD-10-CM | POA: Diagnosis not present

## 2023-09-08 DIAGNOSIS — N183 Chronic kidney disease, stage 3 unspecified: Secondary | ICD-10-CM | POA: Diagnosis not present

## 2023-09-08 DIAGNOSIS — Z87891 Personal history of nicotine dependence: Secondary | ICD-10-CM | POA: Diagnosis not present

## 2023-09-08 DIAGNOSIS — I2510 Atherosclerotic heart disease of native coronary artery without angina pectoris: Secondary | ICD-10-CM | POA: Diagnosis not present

## 2023-09-08 DIAGNOSIS — Z951 Presence of aortocoronary bypass graft: Secondary | ICD-10-CM | POA: Diagnosis not present

## 2023-09-08 DIAGNOSIS — G510 Bell's palsy: Secondary | ICD-10-CM | POA: Diagnosis not present

## 2023-09-08 DIAGNOSIS — I69351 Hemiplegia and hemiparesis following cerebral infarction affecting right dominant side: Secondary | ICD-10-CM | POA: Diagnosis not present

## 2023-09-08 DIAGNOSIS — I081 Rheumatic disorders of both mitral and tricuspid valves: Secondary | ICD-10-CM | POA: Diagnosis not present

## 2023-09-08 DIAGNOSIS — I480 Paroxysmal atrial fibrillation: Secondary | ICD-10-CM | POA: Diagnosis not present

## 2023-09-08 DIAGNOSIS — Z7901 Long term (current) use of anticoagulants: Secondary | ICD-10-CM | POA: Diagnosis not present

## 2023-09-08 DIAGNOSIS — E782 Mixed hyperlipidemia: Secondary | ICD-10-CM | POA: Diagnosis not present

## 2023-09-08 DIAGNOSIS — I129 Hypertensive chronic kidney disease with stage 1 through stage 4 chronic kidney disease, or unspecified chronic kidney disease: Secondary | ICD-10-CM | POA: Diagnosis not present

## 2023-09-08 DIAGNOSIS — Z556 Problems related to health literacy: Secondary | ICD-10-CM | POA: Diagnosis not present

## 2023-09-09 DIAGNOSIS — I700 Atherosclerosis of aorta: Secondary | ICD-10-CM | POA: Diagnosis not present

## 2023-09-09 DIAGNOSIS — I350 Nonrheumatic aortic (valve) stenosis: Secondary | ICD-10-CM | POA: Diagnosis not present

## 2023-09-09 DIAGNOSIS — I10 Essential (primary) hypertension: Secondary | ICD-10-CM | POA: Diagnosis not present

## 2023-09-09 DIAGNOSIS — I480 Paroxysmal atrial fibrillation: Secondary | ICD-10-CM | POA: Diagnosis not present

## 2023-09-09 DIAGNOSIS — Z299 Encounter for prophylactic measures, unspecified: Secondary | ICD-10-CM | POA: Diagnosis not present

## 2023-09-09 DIAGNOSIS — Z09 Encounter for follow-up examination after completed treatment for conditions other than malignant neoplasm: Secondary | ICD-10-CM | POA: Diagnosis not present

## 2023-09-11 DIAGNOSIS — I129 Hypertensive chronic kidney disease with stage 1 through stage 4 chronic kidney disease, or unspecified chronic kidney disease: Secondary | ICD-10-CM | POA: Diagnosis not present

## 2023-09-11 DIAGNOSIS — Z951 Presence of aortocoronary bypass graft: Secondary | ICD-10-CM | POA: Diagnosis not present

## 2023-09-11 DIAGNOSIS — I69351 Hemiplegia and hemiparesis following cerebral infarction affecting right dominant side: Secondary | ICD-10-CM | POA: Diagnosis not present

## 2023-09-11 DIAGNOSIS — Z87891 Personal history of nicotine dependence: Secondary | ICD-10-CM | POA: Diagnosis not present

## 2023-09-11 DIAGNOSIS — N183 Chronic kidney disease, stage 3 unspecified: Secondary | ICD-10-CM | POA: Diagnosis not present

## 2023-09-11 DIAGNOSIS — G510 Bell's palsy: Secondary | ICD-10-CM | POA: Diagnosis not present

## 2023-09-11 DIAGNOSIS — Z556 Problems related to health literacy: Secondary | ICD-10-CM | POA: Diagnosis not present

## 2023-09-11 DIAGNOSIS — E782 Mixed hyperlipidemia: Secondary | ICD-10-CM | POA: Diagnosis not present

## 2023-09-11 DIAGNOSIS — Z7901 Long term (current) use of anticoagulants: Secondary | ICD-10-CM | POA: Diagnosis not present

## 2023-09-11 DIAGNOSIS — I081 Rheumatic disorders of both mitral and tricuspid valves: Secondary | ICD-10-CM | POA: Diagnosis not present

## 2023-09-11 DIAGNOSIS — I480 Paroxysmal atrial fibrillation: Secondary | ICD-10-CM | POA: Diagnosis not present

## 2023-09-11 DIAGNOSIS — I2510 Atherosclerotic heart disease of native coronary artery without angina pectoris: Secondary | ICD-10-CM | POA: Diagnosis not present

## 2023-09-11 DIAGNOSIS — Z9181 History of falling: Secondary | ICD-10-CM | POA: Diagnosis not present

## 2023-09-17 DIAGNOSIS — N183 Chronic kidney disease, stage 3 unspecified: Secondary | ICD-10-CM | POA: Diagnosis not present

## 2023-09-17 DIAGNOSIS — E782 Mixed hyperlipidemia: Secondary | ICD-10-CM | POA: Diagnosis not present

## 2023-09-17 DIAGNOSIS — Z951 Presence of aortocoronary bypass graft: Secondary | ICD-10-CM | POA: Diagnosis not present

## 2023-09-17 DIAGNOSIS — Z556 Problems related to health literacy: Secondary | ICD-10-CM | POA: Diagnosis not present

## 2023-09-17 DIAGNOSIS — I081 Rheumatic disorders of both mitral and tricuspid valves: Secondary | ICD-10-CM | POA: Diagnosis not present

## 2023-09-17 DIAGNOSIS — Z87891 Personal history of nicotine dependence: Secondary | ICD-10-CM | POA: Diagnosis not present

## 2023-09-17 DIAGNOSIS — I69351 Hemiplegia and hemiparesis following cerebral infarction affecting right dominant side: Secondary | ICD-10-CM | POA: Diagnosis not present

## 2023-09-17 DIAGNOSIS — I480 Paroxysmal atrial fibrillation: Secondary | ICD-10-CM | POA: Diagnosis not present

## 2023-09-17 DIAGNOSIS — Z9181 History of falling: Secondary | ICD-10-CM | POA: Diagnosis not present

## 2023-09-17 DIAGNOSIS — Z7901 Long term (current) use of anticoagulants: Secondary | ICD-10-CM | POA: Diagnosis not present

## 2023-09-17 DIAGNOSIS — G510 Bell's palsy: Secondary | ICD-10-CM | POA: Diagnosis not present

## 2023-09-17 DIAGNOSIS — I2510 Atherosclerotic heart disease of native coronary artery without angina pectoris: Secondary | ICD-10-CM | POA: Diagnosis not present

## 2023-09-17 DIAGNOSIS — I129 Hypertensive chronic kidney disease with stage 1 through stage 4 chronic kidney disease, or unspecified chronic kidney disease: Secondary | ICD-10-CM | POA: Diagnosis not present

## 2023-09-18 DIAGNOSIS — L97521 Non-pressure chronic ulcer of other part of left foot limited to breakdown of skin: Secondary | ICD-10-CM | POA: Diagnosis not present

## 2023-09-18 DIAGNOSIS — I639 Cerebral infarction, unspecified: Secondary | ICD-10-CM | POA: Diagnosis not present

## 2023-09-19 DIAGNOSIS — E782 Mixed hyperlipidemia: Secondary | ICD-10-CM | POA: Diagnosis not present

## 2023-09-19 DIAGNOSIS — N183 Chronic kidney disease, stage 3 unspecified: Secondary | ICD-10-CM | POA: Diagnosis not present

## 2023-09-19 DIAGNOSIS — Z9181 History of falling: Secondary | ICD-10-CM | POA: Diagnosis not present

## 2023-09-19 DIAGNOSIS — Z556 Problems related to health literacy: Secondary | ICD-10-CM | POA: Diagnosis not present

## 2023-09-19 DIAGNOSIS — I2510 Atherosclerotic heart disease of native coronary artery without angina pectoris: Secondary | ICD-10-CM | POA: Diagnosis not present

## 2023-09-19 DIAGNOSIS — Z7901 Long term (current) use of anticoagulants: Secondary | ICD-10-CM | POA: Diagnosis not present

## 2023-09-19 DIAGNOSIS — I081 Rheumatic disorders of both mitral and tricuspid valves: Secondary | ICD-10-CM | POA: Diagnosis not present

## 2023-09-19 DIAGNOSIS — Z951 Presence of aortocoronary bypass graft: Secondary | ICD-10-CM | POA: Diagnosis not present

## 2023-09-19 DIAGNOSIS — G510 Bell's palsy: Secondary | ICD-10-CM | POA: Diagnosis not present

## 2023-09-19 DIAGNOSIS — Z87891 Personal history of nicotine dependence: Secondary | ICD-10-CM | POA: Diagnosis not present

## 2023-09-19 DIAGNOSIS — I69351 Hemiplegia and hemiparesis following cerebral infarction affecting right dominant side: Secondary | ICD-10-CM | POA: Diagnosis not present

## 2023-09-19 DIAGNOSIS — I129 Hypertensive chronic kidney disease with stage 1 through stage 4 chronic kidney disease, or unspecified chronic kidney disease: Secondary | ICD-10-CM | POA: Diagnosis not present

## 2023-09-19 DIAGNOSIS — I480 Paroxysmal atrial fibrillation: Secondary | ICD-10-CM | POA: Diagnosis not present

## 2023-09-25 DIAGNOSIS — I69351 Hemiplegia and hemiparesis following cerebral infarction affecting right dominant side: Secondary | ICD-10-CM | POA: Diagnosis not present

## 2023-09-25 DIAGNOSIS — Z556 Problems related to health literacy: Secondary | ICD-10-CM | POA: Diagnosis not present

## 2023-09-25 DIAGNOSIS — I081 Rheumatic disorders of both mitral and tricuspid valves: Secondary | ICD-10-CM | POA: Diagnosis not present

## 2023-09-25 DIAGNOSIS — I2510 Atherosclerotic heart disease of native coronary artery without angina pectoris: Secondary | ICD-10-CM | POA: Diagnosis not present

## 2023-09-25 DIAGNOSIS — G510 Bell's palsy: Secondary | ICD-10-CM | POA: Diagnosis not present

## 2023-09-25 DIAGNOSIS — Z951 Presence of aortocoronary bypass graft: Secondary | ICD-10-CM | POA: Diagnosis not present

## 2023-09-25 DIAGNOSIS — I480 Paroxysmal atrial fibrillation: Secondary | ICD-10-CM | POA: Diagnosis not present

## 2023-09-25 DIAGNOSIS — I129 Hypertensive chronic kidney disease with stage 1 through stage 4 chronic kidney disease, or unspecified chronic kidney disease: Secondary | ICD-10-CM | POA: Diagnosis not present

## 2023-09-25 DIAGNOSIS — Z87891 Personal history of nicotine dependence: Secondary | ICD-10-CM | POA: Diagnosis not present

## 2023-09-25 DIAGNOSIS — N183 Chronic kidney disease, stage 3 unspecified: Secondary | ICD-10-CM | POA: Diagnosis not present

## 2023-09-25 DIAGNOSIS — Z9181 History of falling: Secondary | ICD-10-CM | POA: Diagnosis not present

## 2023-09-25 DIAGNOSIS — Z7901 Long term (current) use of anticoagulants: Secondary | ICD-10-CM | POA: Diagnosis not present

## 2023-09-25 DIAGNOSIS — E782 Mixed hyperlipidemia: Secondary | ICD-10-CM | POA: Diagnosis not present

## 2023-09-30 DIAGNOSIS — I081 Rheumatic disorders of both mitral and tricuspid valves: Secondary | ICD-10-CM | POA: Diagnosis not present

## 2023-09-30 DIAGNOSIS — N183 Chronic kidney disease, stage 3 unspecified: Secondary | ICD-10-CM | POA: Diagnosis not present

## 2023-09-30 DIAGNOSIS — I129 Hypertensive chronic kidney disease with stage 1 through stage 4 chronic kidney disease, or unspecified chronic kidney disease: Secondary | ICD-10-CM | POA: Diagnosis not present

## 2023-09-30 DIAGNOSIS — E782 Mixed hyperlipidemia: Secondary | ICD-10-CM | POA: Diagnosis not present

## 2023-09-30 DIAGNOSIS — I480 Paroxysmal atrial fibrillation: Secondary | ICD-10-CM | POA: Diagnosis not present

## 2023-09-30 DIAGNOSIS — Z7901 Long term (current) use of anticoagulants: Secondary | ICD-10-CM | POA: Diagnosis not present

## 2023-09-30 DIAGNOSIS — Z556 Problems related to health literacy: Secondary | ICD-10-CM | POA: Diagnosis not present

## 2023-09-30 DIAGNOSIS — G510 Bell's palsy: Secondary | ICD-10-CM | POA: Diagnosis not present

## 2023-09-30 DIAGNOSIS — Z87891 Personal history of nicotine dependence: Secondary | ICD-10-CM | POA: Diagnosis not present

## 2023-09-30 DIAGNOSIS — I2510 Atherosclerotic heart disease of native coronary artery without angina pectoris: Secondary | ICD-10-CM | POA: Diagnosis not present

## 2023-09-30 DIAGNOSIS — I69351 Hemiplegia and hemiparesis following cerebral infarction affecting right dominant side: Secondary | ICD-10-CM | POA: Diagnosis not present

## 2023-09-30 DIAGNOSIS — Z9181 History of falling: Secondary | ICD-10-CM | POA: Diagnosis not present

## 2023-09-30 DIAGNOSIS — Z951 Presence of aortocoronary bypass graft: Secondary | ICD-10-CM | POA: Diagnosis not present

## 2023-10-03 DIAGNOSIS — I700 Atherosclerosis of aorta: Secondary | ICD-10-CM | POA: Diagnosis not present

## 2023-10-03 DIAGNOSIS — R279 Unspecified lack of coordination: Secondary | ICD-10-CM | POA: Diagnosis not present

## 2023-10-03 DIAGNOSIS — I10 Essential (primary) hypertension: Secondary | ICD-10-CM | POA: Diagnosis not present

## 2023-10-03 DIAGNOSIS — D692 Other nonthrombocytopenic purpura: Secondary | ICD-10-CM | POA: Diagnosis not present

## 2023-10-03 DIAGNOSIS — R42 Dizziness and giddiness: Secondary | ICD-10-CM | POA: Diagnosis not present

## 2023-10-03 DIAGNOSIS — Z299 Encounter for prophylactic measures, unspecified: Secondary | ICD-10-CM | POA: Diagnosis not present

## 2023-10-09 DIAGNOSIS — L97521 Non-pressure chronic ulcer of other part of left foot limited to breakdown of skin: Secondary | ICD-10-CM | POA: Diagnosis not present

## 2023-10-20 ENCOUNTER — Other Ambulatory Visit: Payer: Self-pay | Admitting: Nurse Practitioner

## 2023-10-29 DIAGNOSIS — R42 Dizziness and giddiness: Secondary | ICD-10-CM | POA: Diagnosis not present

## 2023-10-29 DIAGNOSIS — I10 Essential (primary) hypertension: Secondary | ICD-10-CM | POA: Diagnosis not present

## 2023-10-29 DIAGNOSIS — Z299 Encounter for prophylactic measures, unspecified: Secondary | ICD-10-CM | POA: Diagnosis not present

## 2023-10-29 DIAGNOSIS — J309 Allergic rhinitis, unspecified: Secondary | ICD-10-CM | POA: Diagnosis not present

## 2023-10-29 DIAGNOSIS — R279 Unspecified lack of coordination: Secondary | ICD-10-CM | POA: Diagnosis not present

## 2023-10-30 DIAGNOSIS — E1142 Type 2 diabetes mellitus with diabetic polyneuropathy: Secondary | ICD-10-CM | POA: Diagnosis not present

## 2023-10-30 DIAGNOSIS — L97521 Non-pressure chronic ulcer of other part of left foot limited to breakdown of skin: Secondary | ICD-10-CM | POA: Diagnosis not present

## 2023-11-06 DIAGNOSIS — R42 Dizziness and giddiness: Secondary | ICD-10-CM | POA: Diagnosis not present

## 2023-11-06 DIAGNOSIS — R279 Unspecified lack of coordination: Secondary | ICD-10-CM | POA: Diagnosis not present

## 2023-11-12 ENCOUNTER — Other Ambulatory Visit: Payer: Self-pay | Admitting: *Deleted

## 2023-11-12 ENCOUNTER — Encounter: Payer: Self-pay | Admitting: Cardiology

## 2023-11-12 ENCOUNTER — Ambulatory Visit: Payer: Medicare Other | Attending: Cardiology | Admitting: Cardiology

## 2023-11-12 VITALS — BP 122/72 | HR 81 | Ht 71.0 in | Wt 203.0 lb

## 2023-11-12 DIAGNOSIS — I25119 Atherosclerotic heart disease of native coronary artery with unspecified angina pectoris: Secondary | ICD-10-CM | POA: Diagnosis not present

## 2023-11-12 DIAGNOSIS — Z8679 Personal history of other diseases of the circulatory system: Secondary | ICD-10-CM

## 2023-11-12 DIAGNOSIS — I10 Essential (primary) hypertension: Secondary | ICD-10-CM | POA: Diagnosis not present

## 2023-11-12 DIAGNOSIS — E782 Mixed hyperlipidemia: Secondary | ICD-10-CM | POA: Diagnosis not present

## 2023-11-12 DIAGNOSIS — Z8673 Personal history of transient ischemic attack (TIA), and cerebral infarction without residual deficits: Secondary | ICD-10-CM

## 2023-11-12 DIAGNOSIS — R279 Unspecified lack of coordination: Secondary | ICD-10-CM | POA: Diagnosis not present

## 2023-11-12 DIAGNOSIS — N1832 Chronic kidney disease, stage 3b: Secondary | ICD-10-CM | POA: Diagnosis not present

## 2023-11-12 DIAGNOSIS — R42 Dizziness and giddiness: Secondary | ICD-10-CM | POA: Diagnosis not present

## 2023-11-12 MED ORDER — APIXABAN 5 MG PO TABS: 5.0000 mg | ORAL_TABLET | Freq: Two times a day (BID) | ORAL | 2 refills | Status: DC

## 2023-11-12 MED ORDER — APIXABAN 5 MG PO TABS: 5.0000 mg | ORAL_TABLET | Freq: Two times a day (BID) | ORAL | 5 refills | Status: DC

## 2023-11-12 MED ORDER — FENOFIBRATE 160 MG PO TABS: 160.0000 mg | ORAL_TABLET | Freq: Every day | ORAL | 2 refills | Status: DC

## 2023-11-12 NOTE — Progress Notes (Signed)
 Cardiology Office Note  Date: 11/12/2023   ID: Carl Young, DOB 09-22-1940, MRN [id number]  History of Present Illness: Carl Young is an 83 y.o. male last seen in November 2024.  He is here today with his wife for a follow-up visit.  I reviewed interval history. He was hospitalized in February with stroke involving right PCA distribution involving the right temporoccipital junction and right thalamus by brain MRI.  Mild to moderate ICA stenosis noted by carotid Dopplers and echocardiogram demonstrated normal LVEF at 60 to 65%.  He does have moderate to severe likely paradoxical normal flow/low gradient aortic stenosis which we have been following.  Mean AV gradient was 16.4 mmHg with dimensionless index 0.3 by that study.  Outpatient cardiac monitor subsequently obtained and did not demonstrate any atrial fibrillation.  He has a visit scheduled to see neurology on May 27.  He tells me that he is starting a round of PT, still has weakness in his legs.  He does not report any chest pain or palpitations, no dizziness or syncope.  It sounds like he had a lot of trouble with balance with his initial stroke symptoms.  We went over his medications.  He has been on Eliquis  since hospitalization in February given concerns about atrial fibrillation as a potential etiology.  He and his wife are both under the impression that he was in atrial fibrillation per discussion with the ER doctor on initial evaluation, however I cannot find this clearly documented anywhere.  His ECG at that time showed sinus rhythm with PACs.  Subsequent cardiac monitor did not show any atrial fibrillation.  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  We discussed his progressive aortic stenosis, may ultimately consider TAVR evaluation, but I would  let him get further out from his stroke prior to pursuing this.  Physical Exam: VS:  BP 122/72 (BP Location: Left Arm)   Pulse 81   Ht 5\' 11"  (1.803 m)   Wt 203 lb (92.1 kg)   SpO2 95%   BMI 28.31 kg/m , BMI Body mass index is 28.31 kg/m.  Wt Readings from Last 3 Encounters:  11/12/23 203 lb (92.1 kg)  08/26/23 207 lb (93.9 kg)  05/26/23 211 lb 12.8 oz (96.1 kg)    General: Patient appears comfortable at rest. HEENT: Conjunctiva and lids normal. Neck: Supple, no elevated JVP or carotid bruits. Cardiac: Regular rate and rhythm with ectopy, no S3, 3/6 systolic murmur. Extremities: No pitting edema.  ECG:  An ECG dated 08/26/2023 was personally reviewed today and demonstrated:  Sinus rhythm with PACs.  Labwork: 08/26/2023: BUN 23; Creatinine, Ser 1.21; Hemoglobin 15.0; Platelets 173; Potassium 4.1; Sodium 141     Component Value Date/Time   CHOL 135 08/27/2023 0357   TRIG 378 (H) 08/27/2023 0357   HDL 23 (L) 08/27/2023 0357   CHOLHDL 5.9 08/27/2023 0357   VLDL 76 (H) 08/27/2023 0357   LDLCALC 36 08/27/2023 0357   Other Studies Reviewed Today:  Echocardiogram 08/27/2023:  1. Left ventricular ejection fraction, by estimation, is 60 to 65%. The  left ventricle has normal function. The left ventricle has no regional  wall motion abnormalities. There is mild concentric left ventricular  hypertrophy. Left ventricular diastolic  parameters are consistent with Grade I diastolic dysfunction (impaired  relaxation).  2. Right ventricular systolic function is normal. The right ventricular  size is normal. Tricuspid regurgitation signal is inadequate for assessing  PA pressure.   3. The mitral valve is degenerative. Trivial mitral valve regurgitation.   4. The aortic valve has an indeterminant number of cusps. There is  moderate calcification of the aortic valve. Aortic valve stenosis is  moderate to severe, likely paradoxical normal flow/low gradient. Aortic  valve mean gradient  measures 16.4 mmHg.  Dimentionless index 0.30.   5. Aortic dilatation noted. There is borderline dilatation of the aortic  root, measuring 39 mm. There is mild dilatation of the ascending aorta,  measuring 38 mm.   6. Unable to estimate CVP.   Carotid Dopplers 08/28/2023: IMPRESSION: 1. Mild (1-49%) stenosis proximal right internal carotid artery secondary to heterogenous atherosclerotic plaque. 2. Mild (1-49%) stenosis proximal right internal carotid artery secondary to heterogenous atherosclerotic plaque. 3. Vertebral arteries are patent with normal antegrade flow.  Cardiac monitor March 2025: ZIO monitor reviewed.  13 days, 3 hours analyzed.   Predominant rhythm is sinus with prolonged PR interval, heart rate ranging from 49 bpm up to 109 bpm and average heart rate 70 bpm. There were occasional PACs representing 4.9% total beats with otherwise rare atrial couplets and triplets. There were rare PVCs including ventricular couplets and triplets representing less than 1% total beats.  Also limited episodes of ventricular bigeminy and trigeminy.  Two episodes of NSVT were noted, the longest of which was 7 beats.  No sustained ventricular events. There were 13 episodes of PSVT, the longest of which lasted 9 beats.  No sustained atrial events. There was a 3.4-second pause at 5:55 AM on March 3 in the setting of limited second-degree type II heart block.  No patient triggered symptoms.  Assessment and Plan:  1.  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  LVEF 60 to 65% echocardiogram in February.  He does not report any angina with current level of activity.  Presently not on antiplatelet regimen given use of Eliquis .  Continue Crestor  40 mg daily.   2.  Degenerative calcific aortic stenosis.  Echocardiogram in February demonstrated paradoxical normal flow/low gradient aortic stenosis of moderate to severe  degree with mean AV gradient 16.4 mmHg and dimensionless index 0.3.  We will bring him back for clinical reevaluation in 6 months.  Did discuss possibility of TAVR evaluation, but would let him get further out from stroke prior to considering this.  3.  Ischemic stroke documented in February as discussed above.  Potentially embolic, although no definite atrial fibrillation was uncovered during his workup including outpatient cardiac monitor.  He does have a prior history of postoperative atrial fibrillation however.  Given his thromboembolic risk score I think is reasonable to continue Eliquis  as long as he tolerates it.  He will see neurology later this month.   4.  Mixed hyperlipidemia.  LDL 36 in February.  Continue Crestor  40 mg daily.   5.  Primary hypertension.  Continue Norvasc  10 mg daily, Lotensin  40 mg daily, and Toprol -XL 50 mg twice daily.   6.  CKD stage IIIa-b.  Lab work in February showed creatinine down to 1.21 with GFR 59 and potassium 4.1.  Disposition:  Follow up 6 months.  Signed, Gerard Knight, M.D., F.A.C.C. [facility] at [facility]

## 2023-11-12 NOTE — Telephone Encounter (Signed)
 Prescription refill request for Eliquis  received. Indication: CVA Last office visit: 11/12/23  Anselm Basset MD Scr: 1.21 on 08/26/23  Epic Age: 83 Weight: 92.1kg  Based on above findings Eliquis  5mg  twice daily is the appropriate dose.  Refill approved.

## 2023-11-12 NOTE — Patient Instructions (Signed)
 Medication Instructions:  Your physician recommends that you continue on your current medications as directed. Please refer to the Current Medication list given to you today.   Labwork: None today  Testing/Procedures: None today  Follow-Up: 6 months  Any Other Special Instructions Will Be Listed Below (If Applicable).  If you need a refill on your cardiac medications before your next appointment, please call your pharmacy.

## 2023-11-13 DIAGNOSIS — L97521 Non-pressure chronic ulcer of other part of left foot limited to breakdown of skin: Secondary | ICD-10-CM | POA: Diagnosis not present

## 2023-11-17 DIAGNOSIS — R279 Unspecified lack of coordination: Secondary | ICD-10-CM | POA: Diagnosis not present

## 2023-11-17 DIAGNOSIS — R42 Dizziness and giddiness: Secondary | ICD-10-CM | POA: Diagnosis not present

## 2023-11-19 DIAGNOSIS — H353221 Exudative age-related macular degeneration, left eye, with active choroidal neovascularization: Secondary | ICD-10-CM | POA: Diagnosis not present

## 2023-11-20 DIAGNOSIS — R42 Dizziness and giddiness: Secondary | ICD-10-CM | POA: Diagnosis not present

## 2023-11-20 DIAGNOSIS — R279 Unspecified lack of coordination: Secondary | ICD-10-CM | POA: Diagnosis not present

## 2023-11-22 ENCOUNTER — Emergency Department (HOSPITAL_COMMUNITY)

## 2023-11-22 ENCOUNTER — Emergency Department (HOSPITAL_COMMUNITY)
Admission: EM | Admit: 2023-11-22 | Discharge: 2023-11-22 | Disposition: A | Discharge status: Home / Self Care | Attending: Emergency Medicine | Admitting: Emergency Medicine

## 2023-11-22 ENCOUNTER — Other Ambulatory Visit: Payer: Self-pay

## 2023-11-22 DIAGNOSIS — R531 Weakness: Secondary | ICD-10-CM | POA: Diagnosis not present

## 2023-11-22 DIAGNOSIS — R29898 Other symptoms and signs involving the musculoskeletal system: Secondary | ICD-10-CM | POA: Diagnosis not present

## 2023-11-22 DIAGNOSIS — R29818 Other symptoms and signs involving the nervous system: Secondary | ICD-10-CM | POA: Diagnosis not present

## 2023-11-22 DIAGNOSIS — I639 Cerebral infarction, unspecified: Secondary | ICD-10-CM | POA: Insufficient documentation

## 2023-11-22 DIAGNOSIS — Z79899 Other long term (current) drug therapy: Secondary | ICD-10-CM | POA: Diagnosis not present

## 2023-11-22 DIAGNOSIS — Z7901 Long term (current) use of anticoagulants: Secondary | ICD-10-CM | POA: Diagnosis not present

## 2023-11-22 DIAGNOSIS — I672 Cerebral atherosclerosis: Secondary | ICD-10-CM | POA: Diagnosis not present

## 2023-11-22 DIAGNOSIS — Z8673 Personal history of transient ischemic attack (TIA), and cerebral infarction without residual deficits: Secondary | ICD-10-CM | POA: Diagnosis not present

## 2023-11-22 DIAGNOSIS — R9082 White matter disease, unspecified: Secondary | ICD-10-CM | POA: Diagnosis not present

## 2023-11-22 DIAGNOSIS — I6523 Occlusion and stenosis of bilateral carotid arteries: Secondary | ICD-10-CM | POA: Diagnosis not present

## 2023-11-22 LAB — COMPREHENSIVE METABOLIC PANEL WITH GFR
ALT: 38 U/L (ref 0–44)
AST: 42 U/L — ABNORMAL HIGH (ref 15–41)
Albumin: 3.9 g/dL (ref 3.5–5.0)
Alkaline Phosphatase: 33 U/L — ABNORMAL LOW (ref 38–126)
Anion gap: 7 (ref 5–15)
BUN: 17 mg/dL (ref 8–23)
CO2: 23 mmol/L (ref 22–32)
Calcium: 9.3 mg/dL (ref 8.9–10.3)
Chloride: 106 mmol/L (ref 98–111)
Creatinine, Ser: 1.11 mg/dL (ref 0.61–1.24)
GFR, Estimated: >60 mL/min (ref >60)
Glucose, Bld: 103 mg/dL — ABNORMAL HIGH (ref 70–99)
Potassium: 3.8 mmol/L (ref 3.5–5.1)
Sodium: 136 mmol/L (ref 135–145)
Total Bilirubin: 0.7 mg/dL (ref 0.0–1.2)
Total Protein: 6.9 g/dL (ref 6.5–8.1)

## 2023-11-22 LAB — DIFFERENTIAL
Abs Immature Granulocytes: 0.02 10*3/uL (ref 0.00–0.07)
Basophils Absolute: 0 10*3/uL (ref 0.0–0.1)
Basophils Relative: 0 %
Eosinophils Absolute: 0.1 10*3/uL (ref 0.0–0.5)
Eosinophils Relative: 1 %
Immature Granulocytes: 0 %
Lymphocytes Relative: 26 %
Lymphs Abs: 1.6 10*3/uL (ref 0.7–4.0)
Monocytes Absolute: 0.6 10*3/uL (ref 0.1–1.0)
Monocytes Relative: 10 %
Neutro Abs: 3.8 10*3/uL (ref 1.7–7.7)
Neutrophils Relative %: 63 %

## 2023-11-22 LAB — RAPID URINE DRUG SCREEN, HOSP PERFORMED
Amphetamines: NOT DETECTED
Barbiturates: NOT DETECTED
Benzodiazepines: NOT DETECTED
Cocaine: NOT DETECTED
Opiates: NOT DETECTED
Tetrahydrocannabinol: NOT DETECTED

## 2023-11-22 LAB — CBC
HCT: 42.8 % (ref 39.0–52.0)
Hemoglobin: 14.1 g/dL (ref 13.0–17.0)
MCH: 31.2 pg (ref 26.0–34.0)
MCHC: 32.9 g/dL (ref 30.0–36.0)
MCV: 94.7 fL (ref 80.0–100.0)
Platelets: 143 10*3/uL — ABNORMAL LOW (ref 150–400)
RBC: 4.52 MIL/uL (ref 4.22–5.81)
RDW: 14.5 % (ref 11.5–15.5)
WBC: 6 10*3/uL (ref 4.0–10.5)
nRBC: 0 % (ref 0.0–0.2)

## 2023-11-22 LAB — PROTIME-INR
INR: 1.2 (ref 0.8–1.2)
Prothrombin Time: 15.8 s — ABNORMAL HIGH (ref 11.4–15.2)

## 2023-11-22 LAB — APTT: aPTT: 31 s (ref 24–36)

## 2023-11-22 LAB — ETHANOL: Alcohol, Ethyl (B): <15 mg/dL (ref <15)

## 2023-11-22 NOTE — ED Notes (Signed)
 Patient transported to CT

## 2023-11-22 NOTE — Discharge Instructions (Signed)
 Your testing shows a new - very small - stroke - this is causing your foot drop / weakness.  I talked to the neuro doctor who looked at your results / MRI and recommends that you can go home and follow up with your neurologist - you are already on all the medicines that are normally given for a stroke.  Continue taking the eliquis .  Thank you for allowing us  to treat you in the emergency department today.  After reviewing your examination and potential testing that was done it appears that you are safe to go home.  I would like for you to follow-up with your doctor within the next several days, have them obtain your records and follow-up with them to review all potential tests and results from your visit.  If you should develop severe or worsening symptoms return to the emergency department immediately

## 2023-11-22 NOTE — ED Provider Notes (Signed)
 [place] [facility] AT [facility] Provider Note   CSN: [id number] Arrival date & time: 11/22/23  0957     History  Chief Complaint  Patient presents with   Extremity Weakness    Carl Young is a 83 y.o. male.   Extremity Weakness   This patient is an 83 year old male with a prior history of a stroke currently on Eliquis , takes medications for blood pressure including amlodipine  and metoprolol .  He takes cholesterol medication in the form of rosuvastatin .  He is also on an ACE inhibitor.  He presents stating that when he went to bed last night at 11:00 he was in his normal state of health, this means he walks with a walker and does physical therapy after having a larger stroke in February which left him with some balance issues.  It was a posterior stroke.  When he woke up this morning and tried to get out of bed at 4:00 he noticed that he was dragging his left foot, he tried to walk with his walker but felt like he could not lift his foot and it was dragging.  He also has some decrease sensation this morning compared to normal.  He denies any weakness of his arms or changes in vision or speech, spouse at the bedside is corroborating the patient's story.  Last seen normal was 11 PM last night    Home Medications Prior to Admission medications   Medication Sig Start Date End Date Taking? Authorizing Provider  acetaminophen  (TYLENOL ) 500 MG tablet Take 500 mg by mouth every 6 (six) hours as needed for mild pain (pain score 1-3).    [provider]  amLODipine  (NORVASC ) 10 MG tablet TAKE 1 TABLET BY MOUTH DAILY 07/24/23   Gerard Knight, MD  apixaban  (ELIQUIS ) 5 MG TABS tablet Take 1 tablet (5 mg total) by mouth 2 (two) times daily. 11/12/23   Gerard Knight, MD  ascorbic acid (VITAMIN C) 1000 MG tablet Take 1,000 mg by mouth daily.     [provider]  benazepril  (LOTENSIN ) 40 MG tablet Take 40 mg by mouth daily.  06/05/17   [provider]  carboxymethylcellulose (REFRESH PLUS) 0.5 % SOLN Place 1 drop into both eyes daily as needed (dry eyes).    [provider]  Cholecalciferol (VITAMIN D3) 50 MCG (2000 UT) capsule Take 2,000 Units by mouth every evening.     [provider]  fenofibrate  160 MG tablet Take 1 tablet (160 mg total) by mouth daily. 11/12/23   Gerard Knight, MD  fish oil-omega-3 fatty acids 1000 MG capsule Take 1 g by mouth in the morning and at bedtime.    [provider]  folic acid  (FOLVITE ) 400 MCG tablet Take 400 mcg by mouth daily.     [provider]  Garlic 1000 MG CAPS Take 1,000 mg by mouth at bedtime.     [provider]  Lysine 500 MG CAPS Take 500 mg by mouth daily.     [provider]  meclizine  (ANTIVERT ) 12.5 MG tablet Take 12.5 mg by mouth 3 (three) times daily as needed. 10/31/23   [provider]  metoprolol  succinate (TOPROL -XL) 50 MG 24 hr tablet TAKE 1 TABLET BY MOUTH TWICE DAILY; take with OR immediately following a meal. 10/20/23   Gerard Knight, MD  Misc Natural Products (BEET ROOT PO) Take 1 capsule by mouth daily.    [provider]  Multiple Vitamin (MULTIVITAMIN  WITH MINERALS) TABS Take 1 tablet by mouth daily.    [provider]  Multiple Vitamins-Minerals (PRESERVISION AREDS 2+MULTI VIT PO) Take 1 capsule by mouth 2 (two) times daily.    [provider]  rosuvastatin  (CRESTOR ) 40 MG tablet Take 1 tablet (40 mg total) by mouth daily. 06/18/20   Gold, Wayne E, PA-C  zinc gluconate 50 MG tablet Take 50 mg by mouth every evening.     [provider]      Allergies    Ciprofloxacin  in d5w    Review of Systems   Review of Systems  Musculoskeletal:  Positive for extremity weakness.  All other systems reviewed and are negative.   Physical Exam Updated Vital Signs BP (!) 136/91   Pulse 67   Temp 98 F (36.7 C) (Oral)   Resp 20   Ht 1.803 m (5\' 11" )   Wt 91.6 kg    SpO2 93%   BMI 28.17 kg/m  Physical Exam Vitals and nursing note reviewed.  Constitutional:      General: He is not in acute distress.    Appearance: He is well-developed.  HENT:     Head: Normocephalic and atraumatic.     Mouth/Throat:     Pharynx: No oropharyngeal exudate.  Eyes:     General: No scleral icterus.       Right eye: No discharge.        Left eye: No discharge.     Conjunctiva/sclera: Conjunctivae normal.     Pupils: Pupils are equal, round, and reactive to light.  Neck:     Thyroid : No thyromegaly.     Vascular: No JVD.  Cardiovascular:     Rate and Rhythm: Normal rate and regular rhythm.     Heart sounds: Normal heart sounds. No murmur heard.    No friction rub. No gallop.  Pulmonary:     Effort: Pulmonary effort is normal. No respiratory distress.     Breath sounds: Normal breath sounds. No wheezing or rales.  Abdominal:     General: Bowel sounds are normal. There is no distension.     Palpations: Abdomen is soft. There is no mass.     Tenderness: There is no abdominal tenderness.  Musculoskeletal:        General: No tenderness. Normal range of motion.     Cervical back: Normal range of motion and neck supple.  Lymphadenopathy:     Cervical: No cervical adenopathy.  Skin:    General: Skin is warm and dry.     Findings: No erythema or rash.  Neurological:     Mental Status: He is alert.     Coordination: Coordination normal.     Comments: Other than some left sided foot weakness and left leg weakness and trying to straight leg raise and some decrease sensation in the left leg he has an unremarkable neurologic exam.  There is no pronator drift, no difficulty with finger-nose-finger.  Right side is normal strength and cranial nerves III through XII are unremarkable  Psychiatric:        Behavior: Behavior normal.     ED Results / Procedures / Treatments   Labs (all labs ordered are listed, but only abnormal results are displayed) Labs Reviewed   PROTIME-INR - Abnormal; Notable for the following components:      Result Value   Prothrombin Time 15.8 (*)    All other components within normal limits  CBC - Abnormal; Notable for the following components:  Platelets 143 (*)    All other components within normal limits  COMPREHENSIVE METABOLIC PANEL WITH GFR - Abnormal; Notable for the following components:   Glucose, Bld 103 (*)    AST 42 (*)    Alkaline Phosphatase 33 (*)    All other components within normal limits  ETHANOL  APTT  DIFFERENTIAL  RAPID URINE DRUG SCREEN, HOSP PERFORMED  I-STAT CHEM 8, ED    EKG EKG Interpretation Date/Time:  Saturday Nov 22 2023 10:34:07 EDT Ventricular Rate:  77 PR Interval:  158 QRS Duration:  79 QT Interval:  376 QTC Calculation: 426 R Axis:   60  Text Interpretation: Sinus rhythm Abnormal T, consider ischemia, lateral leads Confirmed by Early Glisson ([id number]) on 11/22/2023 12:39:28 PM  Radiology CT HEAD WO CONTRAST Result Date: 11/22/2023 EXAM: CT HEAD WITHOUT 11/22/2023 11:13:51 AM TECHNIQUE: CT of the head was performed without the administration of intravenous contrast. Automated exposure control, iterative reconstruction, and/or weight based adjustment of the mA/kV was utilized to reduce the radiation dose to as low as reasonably achievable. COMPARISON: MR head without contrast 08/26/2023. CT head without contrast 08/26/2023. CLINICAL HISTORY: Neuro deficit, acute, stroke suspected; L foot / leg weakness. Neuro deficit, acute, stroke suspected; Left foot leg weakness; come in due to left foot weakness, Had a stroke three months ago and has had deficits on the left side. FINDINGS: BRAIN AND VENTRICLES: Expected evolution of a non-hemorrhagic infarct in the parietal and occipital lobe is noted. Periventricular white matter changes are similar to the prior study. The ventricles are proportionate to the degree of atrophy. No hyperdense vessel is present. ORBITS: Bilateral lens replacements  are present. The globes and orbits are otherwise within normal limits. SINUSES: The visualized paranasal sinuses and mastoid air cells demonstrate no acute abnormality. SOFT TISSUES AND SKULL: No acute abnormality of the visualized skull or soft tissues. Atherosclerotic calcifications are present in the cavernous carotid arteries bilaterally and at the dural margin of both vertebral arteries. IMPRESSION: 1. No acute intracranial abnormality. 2. Expected evolution of a non-hemorrhagic infarct in the parietal and occipital lobe. 3. Atherosclerosis. Electronically signed by: Audree Leas MD 11/22/2023 11:51 AM EDT RP Workstation: [id number]    Procedures Procedures    Medications Ordered in ED Medications - No data to display  ED Course/ Medical Decision Making/ A&P Clinical Course as of 11/22/23 1352  Sat Nov 22, 2023  1225 Neur rec:  MRI [BM]    Clinical Course User Index [BM] Early Glisson, MD                                 Medical Decision Making Amount and/or Complexity of Data Reviewed Labs: ordered. Radiology: ordered.    This patient presents to the ED for concern of possible stroke, this involves an extensive number of treatment options, and is a complaint that carries with it a high risk of complications and morbidity.  The differential diagnosis includes stroke, hypoglycemia, recrudescence, hemorrhage   Co morbidities that complicate the patient evaluation  Prior stroke, vascular disease   Additional history obtained:  Additional history obtained from the record External records from outside source obtained and reviewed including evaluations in stabilizing care from February when he was admitted for stroke   Lab Tests:  I Ordered, and personally interpreted labs.  The pertinent results include:  unremarkable CBC, BMP   Imaging Studies ordered:  I ordered imaging studies including CT head /  MR brain  I independently visualized and interpreted imaging  which showed new small ischemic stroke I agree with the radiologist interpretation   Cardiac Monitoring: / EKG:  The patient was maintained on a cardiac monitor.  I personally viewed and interpreted the cardiac monitored which showed an underlying rhythm of: NSR   Problem List / ED Course / Critical interventions / Medication management  No new findigns on MR that weren't expected given symptoms - new mild stroke.   No change in medications I have reviewed the patients home medicines and have made adjustments as needed   Consultations Obtained:  I requested consultation with the neurologist Dr. Doretta Gant,  and discussed lab and imaging findings as well as pertinent plan - they recommend: Discharge home, already maximized and optimized on medications for stroke on Eliquis    Social Determinants of Health:  Prior stroke   Test / Admission - Considered:  Considered admission but neurology recommends discharge, and I think this is very reasonable as the patient's vital signs look great and his symptoms are very mild.  Additionally he has recently had a very thorough and in-depth workup making an admission at this time very unlikely to yield any additional information         Final Clinical Impression(s) / ED Diagnoses Final diagnoses:  Acute ischemic stroke [facility])    Rx / DC Orders ED Discharge Orders     None         Early Glisson, MD 11/22/23 1352

## 2023-11-22 NOTE — ED Notes (Signed)
 ED Provider at bedside.

## 2023-11-22 NOTE — ED Triage Notes (Signed)
 Patient come in due to left foot weakness, Had a stroke three months ago and has had deficits on the left side.

## 2023-11-23 LAB — I-STAT CHEM 8, ED
BUN: 24 mg/dL — ABNORMAL HIGH (ref 8–23)
Calcium, Ion: 1.19 mmol/L (ref 1.15–1.40)
Chloride: 105 mmol/L (ref 98–111)
Creatinine, Ser: 1.3 mg/dL — ABNORMAL HIGH (ref 0.61–1.24)
Glucose, Bld: 101 mg/dL — ABNORMAL HIGH (ref 70–99)
HCT: 45 % (ref 39.0–52.0)
Hemoglobin: 15.3 g/dL (ref 13.0–17.0)
Potassium: 5 mmol/L (ref 3.5–5.1)
Sodium: 142 mmol/L (ref 135–145)
TCO2: 27 mmol/L (ref 22–32)

## 2023-11-25 ENCOUNTER — Encounter: Payer: Self-pay | Admitting: Neurology

## 2023-11-25 ENCOUNTER — Ambulatory Visit: Admitting: Neurology

## 2023-11-25 VITALS — BP 124/68 | HR 85 | Ht 71.0 in | Wt 202.0 lb

## 2023-11-25 DIAGNOSIS — I6381 Other cerebral infarction due to occlusion or stenosis of small artery: Secondary | ICD-10-CM

## 2023-11-25 DIAGNOSIS — I480 Paroxysmal atrial fibrillation: Secondary | ICD-10-CM | POA: Diagnosis not present

## 2023-11-25 DIAGNOSIS — R29898 Other symptoms and signs involving the musculoskeletal system: Secondary | ICD-10-CM

## 2023-11-25 NOTE — Patient Instructions (Signed)
 I had a long d/w patient and his wife  about his recent strokes, left leg weakness and paroxysmal atrial fibrillation,, risk for recurrent stroke/TIAs, personally independently reviewed imaging studies and stroke evaluation results and answered questions.Continue Eliquis  (apixaban ) daily  for secondary stroke prevention and maintain strict control of hypertension with blood pressure goal below 130/90, diabetes with hemoglobin A1c goal below 6.5% and lipids with LDL cholesterol goal below 70 mg/dL. I also advised the patient to eat a healthy diet with plenty of whole grains, cereals, fruits and vegetables, exercise regularly and maintain ideal body weight . Continue outpatient physical and occupational therapies.Followup in the future with my nurse practitioner in 3 months or call earlier if needed.  Stroke Prevention Some medical conditions and behaviors can lead to a higher chance of having a stroke. You can help prevent a stroke by eating healthy, exercising, not smoking, and managing any medical conditions you have. Stroke is a leading cause of functional impairment. Primary prevention is particularly important because a majority of strokes are first-time events. Stroke changes the lives of not only those who experience a stroke but also their family and other caregivers. How can this condition affect me? A stroke is a medical emergency and should be treated right away. A stroke can lead to brain damage and can sometimes be life-threatening. If a person gets medical treatment right away, there is a better chance of surviving and recovering from a stroke. What can increase my risk? The following medical conditions may increase your risk of a stroke: Cardiovascular disease. High blood pressure (hypertension). Diabetes. High cholesterol. Sickle cell disease. Blood clotting disorders (hypercoagulable state). Obesity. Sleep disorders (obstructive sleep apnea). Other risk factors include: Being older  than age 35. Having a history of blood clots, stroke, or mini-stroke (transient ischemic attack, TIA). Genetic factors, such as race, ethnicity, or a family history of stroke. Smoking cigarettes or using other tobacco products. Taking birth control pills, especially if you also use tobacco. Heavy use of alcohol  or drugs, especially cocaine and methamphetamine. Physical inactivity. What actions can I take to prevent this? Manage your health conditions High cholesterol levels. Eating a healthy diet is important for preventing high cholesterol. If cholesterol cannot be managed through diet alone, you may need to take medicines. Take any prescribed medicines to control your cholesterol as told by your health care provider. Hypertension. To reduce your risk of stroke, try to keep your blood pressure below 130/80. Eating a healthy diet and exercising regularly are important for controlling blood pressure. If these steps are not enough to manage your blood pressure, you may need to take medicines. Take any prescribed medicines to control hypertension as told by your health care provider. Ask your health care provider if you should monitor your blood pressure at home. Have your blood pressure checked every year, even if your blood pressure is normal. Blood pressure increases with age and some medical conditions. Diabetes. Eating a healthy diet and exercising regularly are important parts of managing your blood sugar (glucose). If your blood sugar cannot be managed through diet and exercise, you may need to take medicines. Take any prescribed medicines to control your diabetes as told by your health care provider. Get evaluated for obstructive sleep apnea. Talk to your health care provider about getting a sleep evaluation if you snore a lot or have excessive sleepiness. Make sure that any other medical conditions you have, such as atrial fibrillation or atherosclerosis, are managed. Nutrition Follow  instructions from your health  care provider about what to eat or drink to help manage your health condition. These instructions may include: Reducing your daily calorie intake. Limiting how much salt (sodium) you use to 1,500 milligrams (mg) each day. Using only healthy fats for cooking, such as olive oil, canola oil, or sunflower oil. Eating healthy foods. You can do this by: Choosing foods that are high in fiber, such as whole grains, and fresh fruits and vegetables. Eating at least 5 servings of fruits and vegetables a day. Try to fill one-half of your plate with fruits and vegetables at each meal. Choosing lean protein foods, such as lean cuts of meat, poultry without skin, fish, tofu, beans, and nuts. Eating low-fat dairy products. Avoiding foods that are high in sodium. This can help lower blood pressure. Avoiding foods that have saturated fat, trans fat, and cholesterol. This can help prevent high cholesterol. Avoiding processed and prepared foods. Counting your daily carbohydrate intake.  Lifestyle If you drink alcohol : Limit how much you have to: 0-1 drink a day for women who are not pregnant. 0-2 drinks a day for men. Know how much alcohol  is in your drink. In the [place] one drink equals one 12 oz bottle of beer ( ), one 5 oz glass of wine ( ), or one 1 oz glass of hard liquor (44mL). Do not use any products that contain nicotine or tobacco. These products include cigarettes, chewing tobacco, and vaping devices, such as e-cigarettes. If you need help quitting, ask your health care provider. Avoid secondhand smoke. Do not use drugs. Activity  Try to stay at a healthy weight. Get at least 30 minutes of exercise on most days, such as: Fast walking. Biking. Swimming. Medicines Take over-the-counter and prescription medicines only as told by your health care provider. Aspirin  or blood thinners (antiplatelets or anticoagulants) may be recommended to reduce your risk of  forming blood clots that can lead to stroke. Avoid taking birth control pills. Talk to your health care provider about the risks of taking birth control pills if: You are over 66 years old. You smoke. You get very bad headaches. You have had a blood clot. Where to find more information American Stroke Association: www.strokeassociation.org Get help right away if: You or a loved one has any symptoms of a stroke. "BE FAST" is an easy way to remember the main warning signs of a stroke: B - Balance. Signs are dizziness, sudden trouble walking, or loss of balance. E - Eyes. Signs are trouble seeing or a sudden change in vision. F - Face. Signs are sudden weakness or numbness of the face, or the face or eyelid drooping on one side. A - Arms. Signs are weakness or numbness in an arm. This happens suddenly and usually on one side of the body. S - Speech. Signs are sudden trouble speaking, slurred speech, or trouble understanding what people say. T - Time. Time to call emergency services. Write down what time symptoms started. You or a loved one has other signs of a stroke, such as: A sudden, severe headache with no known cause. Nausea or vomiting. Seizure. These symptoms may represent a serious problem that is an emergency. Do not wait to see if the symptoms will go away. Get medical help right away. Call your local emergency services (911 in the [place] Do not drive yourself to the hospital. Summary You can help to prevent a stroke by eating healthy, exercising, not smoking, limiting alcohol  intake, and managing any medical conditions you may have. Do  not use any products that contain nicotine or tobacco. These include cigarettes, chewing tobacco, and vaping devices, such as e-cigarettes. If you need help quitting, ask your health care provider. Remember "BE FAST" for warning signs of a stroke. Get help right away if you or a loved one has any of these signs. This information is not intended to  replace advice given to you by your health care provider. Make sure you discuss any questions you have with your health care provider. Document Revised: 05/20/2022 Document Reviewed: 05/20/2022 Elsevier Patient Education  2024 [employer].

## 2023-11-25 NOTE — Progress Notes (Signed)
 [facility] [address] [place]. [place] [address] [phone]       OFFICE CONSULT NOTE  Mr. VERLE WHEELING Date of Birth:  10-07-1940 Medical Record Number:  [id number]   Referring MD: Rondell Code, DO  Reason for Referral: Stroke  HPI: Mr. Fier is a 83 year old pleasant Caucasian male seen today for initial office consultation visit for stroke.  He is accompanied by his wife.  History is obtained from them and review of electronic medical records.  I personally reviewed pertinent available imaging films in PACS.  He has past medical history of hypertension, hyperlipidemia, postoperative A-fib, coronary artery disease status post CABG in December 2021, CKD stage III, remote history of GI bleed and kidney stones.  Patient presented on 08/28/2023 for an episode 2 days ago when he noticed sudden onset of feeling off balance and leaning to the left side.  When seen in the ER the patient was told by ER physician that he had A-fib but unfortunately this was not documented on EKG.  MRI scan showed a right PCA branch embolic infarct involving right temporal occipital junction and right thalamus.  MR angiogram of the brain showed no large vessel stenosis or occlusion carotid ultrasound showed no significant extracranial stenosis..  Patient was seen on teleneurology consultation.  Echocardiogram showed ejection fraction 60 to 65%.  Left atrial size normal but there was moderate calcified aortic stenosis.  Hemoglobin A1c was 6.2..  LDL cholesterol was 36 mg percent.  Patient had 30-day outpatient cardiac monitor performed which showed no evidence of A-fib.  Patient was however switched to Eliquis  which is tolerating well.  He returned on 11/18/2023 to the emergency room he got out of bed he noticed increased dragging of his left foot which was weak from his prior stroke.  He could not lift his left foot and it was dragging he also felt it had decreased sensation.  MRI scan of the brain  was repeated on 11/22/2023 which showed no acute lateral right thalamic infarct and remote infarcts in the right thalamus and posterior corona radiata.  Eliquis  was continued and patient advised to continue home physical and Occupational Therapy.  Patient states he is doing well.  He is able to ambulate using a cane but his left leg still feels weak and heavy.  He is tolerating Eliquis  well with minor bruising and no bleeding.  He has had no falls or injuries.  Tolerating Crestor  well without muscle aches and pains.  ROS:   14 system review of systems is positive for leg weakness, difficulty walking, bruising, decreased hearing all other systems negative  PMH:  Past Medical History:  Diagnosis Date   Bell's palsy    CAD (coronary artery disease)    Multivessel status post CABG 05/2020 - LIMA to LAD, SVG to ramus intermedius, SVG to OM 2 and OM 3, and SVG to PDA   CKD (chronic kidney disease) stage 3, GFR 30-59 ml/min (HCC)    Essential hypertension    Helicobacter pylori (H. pylori) infection    History of GI bleed    Mixed hyperlipidemia    Nephrolithiasis    Postoperative atrial fibrillation (HCC)     Social History:  Social History   Socioeconomic History   Marital status: Married    Spouse name: Not on file   Number of children: Not on file   Years of education: Not on file   Highest education level: Not on file  Occupational History   Not on  file  Tobacco Use   Smoking status: Former    Current packs/day: 0.00    Types: Cigarettes, Cigars    Quit date: 07/08/1985    Years since quitting: 38.4    Passive exposure: Never   Smokeless tobacco: Never  Vaping Use   Vaping status: Never Used  Substance and Sexual Activity   Alcohol  use: Yes    Comment: Occasional   Drug use: No   Sexual activity: Not on file  Other Topics Concern   Not on file  Social History Narrative   Not on file   Social Drivers of Health   Financial Resource Strain: Not on file  Food Insecurity:  No Food Insecurity (08/27/2023)   Hunger Vital Sign    Worried About Running Out of Food in the Last Year: Never true    Ran Out of Food in the Last Year: Never true  Transportation Needs: No Transportation Needs (08/27/2023)   PRAPARE - Transportation    Lack of Transportation (Medical): No    Lack of Transportation (Non-Medical): No  Physical Activity: Not on file  Stress: Not on file  Social Connections: Socially Integrated (08/27/2023)   Social Connection and Isolation Panel [NHANES]    Frequency of Communication with Friends and Family: More than three times a week    Frequency of Social Gatherings with Friends and Family: More than three times a week    Attends Religious Services: More than 4 times per year    Active Member of [employer] or Organizations: Yes    Attends Engineer, structural: More than 4 times per year    Marital Status: Married  Catering manager Violence: Not At Risk (08/27/2023)   Humiliation, Afraid, Rape, and Kick questionnaire    Fear of Current or Ex-Partner: No    Emotionally Abused: No    Physically Abused: No    Sexually Abused: No    Medications:   Current Outpatient Medications on File Prior to Visit  Medication Sig Dispense Refill   acetaminophen  (TYLENOL ) 500 MG tablet Take 500 mg by mouth every 6 (six) hours as needed for mild pain (pain score 1-3).     amLODipine  (NORVASC ) 10 MG tablet TAKE 1 TABLET BY MOUTH DAILY 30 tablet 6   apixaban  (ELIQUIS ) 5 MG TABS tablet Take 1 tablet (5 mg total) by mouth 2 (two) times daily. 60 tablet 5   ascorbic acid (VITAMIN C) 1000 MG tablet Take 1,000 mg by mouth daily.      benazepril  (LOTENSIN ) 40 MG tablet Take 40 mg by mouth daily.   4   carboxymethylcellulose (REFRESH PLUS) 0.5 % SOLN Place 1 drop into both eyes daily as needed (dry eyes).     Cholecalciferol (VITAMIN D3) 50 MCG (2000 UT) capsule Take 2,000 Units by mouth every evening.      fenofibrate  160 MG tablet Take 1 tablet (160 mg total) by mouth  daily. 30 tablet 2   fish oil-omega-3 fatty acids 1000 MG capsule Take 1 g by mouth in the morning and at bedtime.     folic acid  (FOLVITE ) 400 MCG tablet Take 400 mcg by mouth daily.      Garlic 1000 MG CAPS Take 1,000 mg by mouth at bedtime.      loratadine (CLARITIN) 10 MG tablet Take 10 mg by mouth daily.     Lysine 500 MG CAPS Take 500 mg by mouth daily.      meclizine  (ANTIVERT ) 12.5 MG tablet Take 12.5 mg by mouth  3 (three) times daily as needed.     metoprolol  succinate (TOPROL -XL) 50 MG 24 hr tablet TAKE 1 TABLET BY MOUTH TWICE DAILY; take with OR immediately following a meal. 180 tablet 0   Misc Natural Products (BEET ROOT PO) Take 1 capsule by mouth daily.     Multiple Vitamin (MULTIVITAMIN WITH MINERALS) TABS Take 1 tablet by mouth daily.     Multiple Vitamins-Minerals (PRESERVISION AREDS 2+MULTI VIT PO) Take 1 capsule by mouth 2 (two) times daily.     rosuvastatin  (CRESTOR ) 40 MG tablet Take 1 tablet (40 mg total) by mouth daily. 30 tablet 1   zinc gluconate 50 MG tablet Take 50 mg by mouth every evening.      No current facility-administered medications on file prior to visit.    Allergies:   Allergies  Allergen Reactions   Ciprofloxacin  In D5w Rash and Other (See Comments)    Pruritis     Physical Exam General: well developed, well nourished pleasant elderly Caucasian male, seated, in no evident distress Head: head normocephalic and atraumatic.   Neck: supple with no carotid or supraclavicular bruits Cardiovascular: regular rate and rhythm, soft ejection systolic murmur Musculoskeletal: no deformity Skin:  no rash/petichiae Vascular:  Normal pulses all extremities  Neurologic Exam Mental Status: Awake and fully alert. Oriented to place and time. Recent and remote memory intact. Attention span, concentration and fund of knowledge appropriate. Mood and affect appropriate.  Cranial Nerves: Fundoscopic exam reveals sharp disc margins. Pupils equal, briskly reactive to  light. Extraocular movements full without nystagmus. Visual fields full to confrontation. Hearing diminished bilaterally.  Facial sensation intact. Face, tongue, palate moves normally and symmetrically.  Motor: Normal bulk and tone. Normal strength in all tested extremity muscles except mild weakness of left hip flexors and ankle dorsiflexors.  Tone slightly increased in the left leg.. Sensory.: intact to touch , pinprick , position and vibratory sensation.  Coordination: Rapid alternating movements normal in all extremities. Finger-to-nose and heel-to-shin performed accurately bilaterally. Gait and Station: Arises from chair without difficulty. Stance is normal. Gait uses a cane with slight dragging of the left leg with mild left foot drop.  Unable to do tandem walking.   Reflexes: 1+ and asymmetric and brisker on the left. Toes downgoing.   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  Vascular risk factors of hypertension, hyperlipidemia and paroxysmal A-fib and coronary artery disease     PLAN:I had a long d/w patient and his wife  about his recent strokes, left leg weakness and paroxysmal atrial fibrillation,, risk for recurrent stroke/TIAs, personally independently reviewed imaging studies and stroke evaluation results and answered questions.Continue Eliquis  (apixaban ) daily  for secondary stroke prevention and maintain strict control of hypertension with blood pressure goal below 130/90, diabetes with hemoglobin A1c goal below 6.5% and lipids with LDL cholesterol goal below 70 mg/dL. I also advised the patient to eat a healthy diet with plenty of whole grains, cereals, fruits and vegetables, exercise regularly and maintain ideal body weight . Continue outpatient physical and occupational therapies.Followup in the future with my nurse practitioner in 3 months or call earlier  if needed. Greater than 50% time during this 50-minute visit was spent in counseling and coordination of care about his embolic strokes and paroxysmal A-fib discussion about evaluation and prevention and treatment of stroke and answering questions Ardella Beaver, MD Note: This  document was prepared with digital dictation and possible smart phrase technology. Any transcriptional errors that result from this process are unintentional.

## 2023-11-26 DIAGNOSIS — R42 Dizziness and giddiness: Secondary | ICD-10-CM | POA: Diagnosis not present

## 2023-11-26 DIAGNOSIS — R279 Unspecified lack of coordination: Secondary | ICD-10-CM | POA: Diagnosis not present

## 2023-11-28 DIAGNOSIS — R279 Unspecified lack of coordination: Secondary | ICD-10-CM | POA: Diagnosis not present

## 2023-11-28 DIAGNOSIS — R42 Dizziness and giddiness: Secondary | ICD-10-CM | POA: Diagnosis not present

## 2023-12-01 DIAGNOSIS — R279 Unspecified lack of coordination: Secondary | ICD-10-CM | POA: Diagnosis not present

## 2023-12-01 DIAGNOSIS — R42 Dizziness and giddiness: Secondary | ICD-10-CM | POA: Diagnosis not present

## 2023-12-03 DIAGNOSIS — R279 Unspecified lack of coordination: Secondary | ICD-10-CM | POA: Diagnosis not present

## 2023-12-03 DIAGNOSIS — R42 Dizziness and giddiness: Secondary | ICD-10-CM | POA: Diagnosis not present

## 2023-12-08 DIAGNOSIS — R42 Dizziness and giddiness: Secondary | ICD-10-CM | POA: Diagnosis not present

## 2023-12-08 DIAGNOSIS — R279 Unspecified lack of coordination: Secondary | ICD-10-CM | POA: Diagnosis not present

## 2023-12-09 DIAGNOSIS — B351 Tinea unguium: Secondary | ICD-10-CM | POA: Diagnosis not present

## 2023-12-09 DIAGNOSIS — E1142 Type 2 diabetes mellitus with diabetic polyneuropathy: Secondary | ICD-10-CM | POA: Diagnosis not present

## 2023-12-09 DIAGNOSIS — M79675 Pain in left toe(s): Secondary | ICD-10-CM | POA: Diagnosis not present

## 2023-12-09 DIAGNOSIS — L84 Corns and callosities: Secondary | ICD-10-CM | POA: Diagnosis not present

## 2023-12-09 DIAGNOSIS — M79674 Pain in right toe(s): Secondary | ICD-10-CM | POA: Diagnosis not present

## 2023-12-10 DIAGNOSIS — R279 Unspecified lack of coordination: Secondary | ICD-10-CM | POA: Diagnosis not present

## 2023-12-10 DIAGNOSIS — R42 Dizziness and giddiness: Secondary | ICD-10-CM | POA: Diagnosis not present

## 2023-12-15 DIAGNOSIS — R279 Unspecified lack of coordination: Secondary | ICD-10-CM | POA: Diagnosis not present

## 2023-12-15 DIAGNOSIS — R42 Dizziness and giddiness: Secondary | ICD-10-CM | POA: Diagnosis not present

## 2023-12-16 DIAGNOSIS — I10 Essential (primary) hypertension: Secondary | ICD-10-CM | POA: Diagnosis not present

## 2023-12-16 DIAGNOSIS — Z7189 Other specified counseling: Secondary | ICD-10-CM | POA: Diagnosis not present

## 2023-12-16 DIAGNOSIS — G8314 Monoplegia of lower limb affecting left nondominant side: Secondary | ICD-10-CM | POA: Diagnosis not present

## 2023-12-16 DIAGNOSIS — Z0000 Encounter for general adult medical examination without abnormal findings: Secondary | ICD-10-CM | POA: Diagnosis not present

## 2023-12-16 DIAGNOSIS — R42 Dizziness and giddiness: Secondary | ICD-10-CM | POA: Diagnosis not present

## 2023-12-16 DIAGNOSIS — Z1389 Encounter for screening for other disorder: Secondary | ICD-10-CM | POA: Diagnosis not present

## 2023-12-16 DIAGNOSIS — Z299 Encounter for prophylactic measures, unspecified: Secondary | ICD-10-CM | POA: Diagnosis not present

## 2023-12-16 DIAGNOSIS — R5383 Other fatigue: Secondary | ICD-10-CM | POA: Diagnosis not present

## 2023-12-17 DIAGNOSIS — R42 Dizziness and giddiness: Secondary | ICD-10-CM | POA: Diagnosis not present

## 2023-12-17 DIAGNOSIS — R279 Unspecified lack of coordination: Secondary | ICD-10-CM | POA: Diagnosis not present

## 2023-12-22 DIAGNOSIS — R42 Dizziness and giddiness: Secondary | ICD-10-CM | POA: Diagnosis not present

## 2023-12-22 DIAGNOSIS — R279 Unspecified lack of coordination: Secondary | ICD-10-CM | POA: Diagnosis not present

## 2023-12-24 DIAGNOSIS — R279 Unspecified lack of coordination: Secondary | ICD-10-CM | POA: Diagnosis not present

## 2023-12-24 DIAGNOSIS — R42 Dizziness and giddiness: Secondary | ICD-10-CM | POA: Diagnosis not present

## 2023-12-29 DIAGNOSIS — R279 Unspecified lack of coordination: Secondary | ICD-10-CM | POA: Diagnosis not present

## 2023-12-29 DIAGNOSIS — R42 Dizziness and giddiness: Secondary | ICD-10-CM | POA: Diagnosis not present

## 2023-12-31 DIAGNOSIS — R279 Unspecified lack of coordination: Secondary | ICD-10-CM | POA: Diagnosis not present

## 2023-12-31 DIAGNOSIS — R42 Dizziness and giddiness: Secondary | ICD-10-CM | POA: Diagnosis not present

## 2024-01-13 DIAGNOSIS — E1142 Type 2 diabetes mellitus with diabetic polyneuropathy: Secondary | ICD-10-CM | POA: Diagnosis not present

## 2024-01-13 DIAGNOSIS — L97521 Non-pressure chronic ulcer of other part of left foot limited to breakdown of skin: Secondary | ICD-10-CM | POA: Diagnosis not present

## 2024-01-15 DIAGNOSIS — Z85828 Personal history of other malignant neoplasm of skin: Secondary | ICD-10-CM | POA: Diagnosis not present

## 2024-01-15 DIAGNOSIS — Z1283 Encounter for screening for malignant neoplasm of skin: Secondary | ICD-10-CM | POA: Diagnosis not present

## 2024-01-15 DIAGNOSIS — X32XXXD Exposure to sunlight, subsequent encounter: Secondary | ICD-10-CM | POA: Diagnosis not present

## 2024-01-15 DIAGNOSIS — L820 Inflamed seborrheic keratosis: Secondary | ICD-10-CM | POA: Diagnosis not present

## 2024-01-15 DIAGNOSIS — Z08 Encounter for follow-up examination after completed treatment for malignant neoplasm: Secondary | ICD-10-CM | POA: Diagnosis not present

## 2024-01-15 DIAGNOSIS — D225 Melanocytic nevi of trunk: Secondary | ICD-10-CM | POA: Diagnosis not present

## 2024-01-15 DIAGNOSIS — B078 Other viral warts: Secondary | ICD-10-CM | POA: Diagnosis not present

## 2024-01-15 DIAGNOSIS — L570 Actinic keratosis: Secondary | ICD-10-CM | POA: Diagnosis not present

## 2024-01-17 ENCOUNTER — Other Ambulatory Visit: Payer: Self-pay | Admitting: Cardiology

## 2024-01-26 DIAGNOSIS — H353223 Exudative age-related macular degeneration, left eye, with inactive scar: Secondary | ICD-10-CM | POA: Diagnosis not present

## 2024-02-07 ENCOUNTER — Other Ambulatory Visit: Payer: Self-pay | Admitting: Cardiology

## 2024-02-17 DIAGNOSIS — B351 Tinea unguium: Secondary | ICD-10-CM | POA: Diagnosis not present

## 2024-02-17 DIAGNOSIS — I70203 Unspecified atherosclerosis of native arteries of extremities, bilateral legs: Secondary | ICD-10-CM | POA: Diagnosis not present

## 2024-02-17 DIAGNOSIS — M79674 Pain in right toe(s): Secondary | ICD-10-CM | POA: Diagnosis not present

## 2024-02-17 DIAGNOSIS — L84 Corns and callosities: Secondary | ICD-10-CM | POA: Diagnosis not present

## 2024-02-17 DIAGNOSIS — M79675 Pain in left toe(s): Secondary | ICD-10-CM | POA: Diagnosis not present

## 2024-02-25 DIAGNOSIS — H903 Sensorineural hearing loss, bilateral: Secondary | ICD-10-CM | POA: Diagnosis not present

## 2024-03-12 DIAGNOSIS — H903 Sensorineural hearing loss, bilateral: Secondary | ICD-10-CM | POA: Diagnosis not present

## 2024-03-15 ENCOUNTER — Other Ambulatory Visit: Payer: Self-pay | Admitting: Cardiology

## 2024-03-23 DIAGNOSIS — L97521 Non-pressure chronic ulcer of other part of left foot limited to breakdown of skin: Secondary | ICD-10-CM | POA: Diagnosis not present

## 2024-04-01 ENCOUNTER — Ambulatory Visit: Admitting: Adult Health

## 2024-04-01 ENCOUNTER — Encounter: Payer: Self-pay | Admitting: Adult Health

## 2024-04-01 VITALS — BP 144/79 | HR 87 | Ht 71.0 in | Wt 203.0 lb

## 2024-04-01 DIAGNOSIS — Z299 Encounter for prophylactic measures, unspecified: Secondary | ICD-10-CM | POA: Diagnosis not present

## 2024-04-01 DIAGNOSIS — E7800 Pure hypercholesterolemia, unspecified: Secondary | ICD-10-CM | POA: Diagnosis not present

## 2024-04-01 DIAGNOSIS — Z79899 Other long term (current) drug therapy: Secondary | ICD-10-CM | POA: Diagnosis not present

## 2024-04-01 DIAGNOSIS — R2689 Other abnormalities of gait and mobility: Secondary | ICD-10-CM | POA: Diagnosis not present

## 2024-04-01 DIAGNOSIS — I6381 Other cerebral infarction due to occlusion or stenosis of small artery: Secondary | ICD-10-CM

## 2024-04-01 DIAGNOSIS — Z0000 Encounter for general adult medical examination without abnormal findings: Secondary | ICD-10-CM | POA: Diagnosis not present

## 2024-04-01 DIAGNOSIS — G8314 Monoplegia of lower limb affecting left nondominant side: Secondary | ICD-10-CM | POA: Diagnosis not present

## 2024-04-01 DIAGNOSIS — I63431 Cerebral infarction due to embolism of right posterior cerebral artery: Secondary | ICD-10-CM | POA: Diagnosis not present

## 2024-04-01 DIAGNOSIS — R5383 Other fatigue: Secondary | ICD-10-CM | POA: Diagnosis not present

## 2024-04-01 NOTE — Progress Notes (Signed)
 [facility] [address] [place]. [name] [address] [phone]       OFFICE FOLLOW UP NOTE  Mr. Carl Young Date of Birth:  1940-08-29 Medical Record Number:  [id number]   Primary neurologist: Dr. Rosemarie Reason for visit: Stroke follow-up  Chief Complaint  Patient presents with   Cerebrovascular Accident    Rm 8 with spouse  Pt is well and stable, reports weakness in L leg has improved slightly since last visit. No new concerns.       HPI:   Update 04/01/2024 JM: Patient returns for follow-up visit prior visit with Dr. Rosemarie 4 months ago accompanied by his wife.  Overall stable from stroke standpoint without new stroke/TIA symptoms.  Reports residual left leg weakness with some improvement since prior visit.  He also mentions imbalance and ongoing use of rolling walker but does feel prior left foot surgery contributing to balance difficulty.  He has not had any recent falls.  No new stroke/TIA symptoms.  He completed therapy back in July, he does try to stay active at home but also limits this due to fear of developing another wound on his foot, he routinely is followed by podiatry. Reports compliance on Eliquis , Crestor  and fenofibrate  without side effects.  Routinely follows with PCP and cardiology.  No further questions or concerns at this time.     History provided for reference purposes only Consult visit 11/25/2023 Dr. Rosemarie: Carl Young is a 83 year old pleasant Caucasian male seen today for initial office consultation visit for stroke.  He is accompanied by his wife.  History is obtained from them and review of electronic medical records.  I personally reviewed pertinent available imaging films in PACS.  He has past medical history of hypertension, hyperlipidemia, postoperative A-fib, coronary artery disease status post CABG in December 2021, CKD stage III, remote history of GI bleed and kidney stones.  Patient presented on 08/28/2023 for an episode 2 days  ago when he noticed sudden onset of feeling off balance and leaning to the left side.  When seen in the ER the patient was told by ER physician that he had A-fib but unfortunately this was not documented on EKG.  MRI scan showed a right PCA branch embolic infarct involving right temporal occipital junction and right thalamus.  MR angiogram of the brain showed no large vessel stenosis or occlusion carotid ultrasound showed no significant extracranial stenosis..  Patient was seen on teleneurology consultation.  Echocardiogram showed ejection fraction 60 to 65%.  Left atrial size normal but there was moderate calcified aortic stenosis.  Hemoglobin A1c was 6.2..  LDL cholesterol was 36 mg percent.  Patient had 30-day outpatient cardiac monitor performed which showed no evidence of A-fib.  Patient was however switched to Eliquis  which is tolerating well.  He returned on 11/18/2023 to the emergency room he got out of bed he noticed increased dragging of his left foot which was weak from his prior stroke.  He could not lift his left foot and it was dragging he also felt it had decreased sensation.  MRI scan of the brain was repeated on 11/22/2023 which showed acute lateral right thalamic infarct and remote infarcts in the right thalamus and posterior corona radiata.  Eliquis  was continued and patient advised to continue home physical and Occupational Therapy.  Patient states he is doing well.  He is able to ambulate using a cane but his left leg still feels weak and heavy.  He is tolerating Eliquis  well with  minor bruising and no bleeding.  He has had no falls or injuries.  Tolerating Crestor  well without muscle aches and pains.  ROS:   14 system review of systems is positive for those listed in HPI and all other systems negative  PMH:  Past Medical History:  Diagnosis Date   Bell's palsy    CAD (coronary artery disease)    Multivessel status post CABG 05/2020 - LIMA to LAD, SVG to ramus intermedius, SVG to OM 2  and OM 3, and SVG to PDA   CKD (chronic kidney disease) stage 3, GFR 30-59 ml/min (HCC)    Essential hypertension    Helicobacter pylori (H. pylori) infection    History of GI bleed    Mixed hyperlipidemia    Nephrolithiasis    Postoperative atrial fibrillation (HCC)     Social History:  Social History   Socioeconomic History   Marital status: Married    Spouse name: Not on file   Number of children: Not on file   Years of education: Not on file   Highest education level: Not on file  Occupational History   Not on file  Tobacco Use   Smoking status: Former    Current packs/day: 0.00    Types: Cigarettes, Cigars    Quit date: 07/08/1985    Years since quitting: 38.7    Passive exposure: Never   Smokeless tobacco: Never  Vaping Use   Vaping status: Never Used  Substance and Sexual Activity   Alcohol  use: Yes    Comment: Occasional   Drug use: No   Sexual activity: Not on file  Other Topics Concern   Not on file  Social History Narrative   Not on file   Social Drivers of Health   Financial Resource Strain: Not on file  Food Insecurity: No Food Insecurity (08/27/2023)   Hunger Vital Sign    Worried About Running Out of Food in the Last Year: Never true    Ran Out of Food in the Last Year: Never true  Transportation Needs: No Transportation Needs (08/27/2023)   PRAPARE - Administrator, Civil Service (Medical): No    Lack of Transportation (Non-Medical): No  Physical Activity: Not on file  Stress: Not on file  Social Connections: Socially Integrated (08/27/2023)   Social Connection and Isolation Panel    Frequency of Communication with Friends and Family: More than three times a week    Frequency of Social Gatherings with Friends and Family: More than three times a week    Attends Religious Services: More than 4 times per year    Active Member of [employer] or Organizations: Yes    Attends Engineer, structural: More than 4 times per year    Marital  Status: Married  Catering manager Violence: Not At Risk (08/27/2023)   Humiliation, Afraid, Rape, and Kick questionnaire    Fear of Current or Ex-Partner: No    Emotionally Abused: No    Physically Abused: No    Sexually Abused: No    Medications:   Current Outpatient Medications on File Prior to Visit  Medication Sig Dispense Refill   acetaminophen  (TYLENOL ) 500 MG tablet Take 500 mg by mouth every 6 (six) hours as needed for mild pain (pain score 1-3).     amLODipine  (NORVASC ) 10 MG tablet TAKE 1 TABLET BY MOUTH DAILY 90 tablet 1   apixaban  (ELIQUIS ) 5 MG TABS tablet Take 1 tablet (5 mg total) by mouth 2 (two) times  daily. 60 tablet 5   ascorbic acid (VITAMIN C) 1000 MG tablet Take 1,000 mg by mouth daily.      benazepril  (LOTENSIN ) 40 MG tablet Take 40 mg by mouth daily.   4   carboxymethylcellulose (REFRESH PLUS) 0.5 % SOLN Place 1 drop into both eyes daily as needed (dry eyes).     Cholecalciferol (VITAMIN D3) 50 MCG (2000 UT) capsule Take 2,000 Units by mouth every evening.      fenofibrate  160 MG tablet TAKE 1 TABLET BY MOUTH DAILY 90 tablet 2   fish oil-omega-3 fatty acids 1000 MG capsule Take 1 g by mouth in the morning and at bedtime.     folic acid  (FOLVITE ) 400 MCG tablet Take 400 mcg by mouth daily.      Garlic 1000 MG CAPS Take 1,000 mg by mouth at bedtime.      loratadine (CLARITIN) 10 MG tablet Take 10 mg by mouth daily.     Lysine 500 MG CAPS Take 500 mg by mouth daily.      meclizine  (ANTIVERT ) 12.5 MG tablet Take 12.5 mg by mouth 3 (three) times daily as needed.     metoprolol  succinate (TOPROL -XL) 50 MG 24 hr tablet TAKE 1 TABLET BY MOUTH TWICE DAILY; take with OR immediately following a meal. 180 tablet 0   Misc Natural Products (BEET ROOT PO) Take 1 capsule by mouth daily.     Multiple Vitamin (MULTIVITAMIN WITH MINERALS) TABS Take 1 tablet by mouth daily.     Multiple Vitamins-Minerals (PRESERVISION AREDS 2+MULTI VIT PO) Take 1 capsule by mouth 2 (two) times daily.      rosuvastatin  (CRESTOR ) 40 MG tablet Take 1 tablet (40 mg total) by mouth daily. 30 tablet 1   zinc gluconate 50 MG tablet Take 50 mg by mouth every evening.      No current facility-administered medications on file prior to visit.    Allergies:   Allergies  Allergen Reactions   Ciprofloxacin  In D5w Rash and Other (See Comments)    Pruritis     Physical Exam Today's Vitals   04/01/24 1501  BP: (!) 144/79  Pulse: 87  Weight: 203 lb (92.1 kg)  Height: 5' 11 (1.803 m)   Body mass index is 28.31 kg/m.   General: well developed, well nourished pleasant elderly Caucasian male, seated, in no evident distress Head: head normocephalic and atraumatic.   Neck: supple with no carotid or supraclavicular bruits Cardiovascular: irregular rate and rhythm Musculoskeletal: no deformity Skin:  no rash/petichiae Vascular:  Normal pulses all extremities  Neurologic Exam Mental Status: Awake and fully alert.  Fluent speech and language.  Oriented to place and time. Recent and remote memory intact. Attention span, concentration and fund of knowledge appropriate although repeated himself frequently throughout visit. Mood and affect appropriate.  Cranial Nerves: Pupils equal, briskly reactive to light. Extraocular movements full without nystagmus. Visual fields full to confrontation. Hearing diminished bilaterally.  Facial sensation intact. Face, tongue, palate moves normally and symmetrically.  Motor: Normal bulk and tone. Normal strength in all tested extremity muscles except slight weakness of left hip flexors and ankle dorsiflexors.  Sensory.: intact to touch , pinprick , position and vibratory sensation.  Coordination: Rapid alternating movements normal in all extremities. Finger-to-nose and heel-to-shin performed accurately bilaterally. Gait and Station: Arises from chair without difficulty. Stance is normal. Gait demonstrates slow cautious walk with decreased stride length and step height  bilaterally with use of rolling walker Reflexes: 1+ and asymmetric and brisker  on the left. Toes downgoing.       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  Vascular risk factors of hypertension, hyperlipidemia and paroxysmal A-fib and coronary artery disease     PLAN:  - Residual deficits: LLE weakness and gait impairment.  Encouraged routine exercises at home and to call office if interested in additional therapies in the future.  Continue to use of rolling walker at all times unless otherwise advised - Continue Eliquis  5 mg twice daily and Crestor  40 mg daily for secondary stroke prevention managed/prescribed by PCP/cardiology - Continue close PCP and cardiology follow-up for aggressive stroke risk factor management   No further recommendations from neurological standpoint and is close to being followed by PCP and cardiology.  He can follow-up on an as-needed basis at this time which both patient and wife agreed with.    I personally spent a total of 30 minutes in the care of the patient today including preparing to see the patient, performing a medically appropriate exam/evaluation, counseling and educating, and documenting clinical information in the EHR.  Harlene Bogaert, AGNP-BC  [facility] [address] [place], [place] [address]  Phone [phone] Fax [phone] Note: This document was prepared with digital dictation and possible smart phrase technology. Any transcriptional errors that result from this process are unintentional.

## 2024-04-01 NOTE — Patient Instructions (Addendum)
 Please ensure routine exercises at home for hopeful further recovery of left leg weakness Please let me know if you would like to do additional therapy  Continue Eliquis , Crestor   and fenofibrate  for secondary stroke prevention  Continue to follow up with PCP regarding blood pressure, cholesterol and diabetes management  Maintain strict control of hypertension with blood pressure goal below 130/90, diabetes with hemoglobin A1c goal below 7.0 % and cholesterol with LDL cholesterol (bad cholesterol) goal below 70 mg/dL.   Signs of a Stroke? Follow the BEFAST method:  Balance Watch for a sudden loss of balance, trouble with coordination or vertigo Eyes Is there a sudden loss of vision in one or both eyes? Or double vision?  Face: Ask the person to smile. Does one side of the face droop or is it numb?  Arms: Ask the person to raise both arms. Does one arm drift downward? Is there weakness or numbness of a leg? Speech: Ask the person to repeat a simple phrase. Does the speech sound slurred/strange? Is the person confused ? Time: If you observe any of these signs, call 911.        Thank you for coming to see us  at [facility]. I hope we have been able to provide you high quality care today.  You may receive a patient satisfaction survey over the next few weeks. We would appreciate your feedback and comments so that we may continue to improve ourselves and the health of our patients.

## 2024-04-21 DIAGNOSIS — H353222 Exudative age-related macular degeneration, left eye, with inactive choroidal neovascularization: Secondary | ICD-10-CM | POA: Diagnosis not present

## 2024-04-27 DIAGNOSIS — L97521 Non-pressure chronic ulcer of other part of left foot limited to breakdown of skin: Secondary | ICD-10-CM | POA: Diagnosis not present

## 2024-06-08 ENCOUNTER — Ambulatory Visit: Attending: Cardiology | Admitting: Cardiology

## 2024-06-08 ENCOUNTER — Encounter: Payer: Self-pay | Admitting: Cardiology

## 2024-06-08 VITALS — BP 128/62 | HR 82 | Ht 71.0 in | Wt 205.2 lb

## 2024-06-08 DIAGNOSIS — N1831 Chronic kidney disease, stage 3a: Secondary | ICD-10-CM

## 2024-06-08 DIAGNOSIS — I25119 Atherosclerotic heart disease of native coronary artery with unspecified angina pectoris: Secondary | ICD-10-CM

## 2024-06-08 DIAGNOSIS — Z8679 Personal history of other diseases of the circulatory system: Secondary | ICD-10-CM

## 2024-06-08 DIAGNOSIS — I350 Nonrheumatic aortic (valve) stenosis: Secondary | ICD-10-CM

## 2024-06-08 DIAGNOSIS — Z8673 Personal history of transient ischemic attack (TIA), and cerebral infarction without residual deficits: Secondary | ICD-10-CM

## 2024-06-08 DIAGNOSIS — I10 Essential (primary) hypertension: Secondary | ICD-10-CM

## 2024-06-08 MED ORDER — METOPROLOL SUCCINATE ER 25 MG PO TB24: 25.0000 mg | ORAL_TABLET | Freq: Two times a day (BID) | ORAL | 1 refills | Status: AC

## 2024-06-08 NOTE — Progress Notes (Signed)
 Cardiology Office Note  Date: 06/08/2024   ID: ALMIR BOTTS, DOB 1940-12-03, MRN [id number]  History of Present Illness: Carl Young is an 83 y.o. male last seen in May.  He is here with his wife for a follow-up visit.  He did complete PT, walks with a rollator outdoors and uses a walker inside.  His wife states that she hears him breathing heavily sometimes with activity, but he does not specifically mention feeling short of breath.  No chest pain, no peripheral edema.  We went over his medications.  He continues to tolerate Eliquis  without any spontaneous bleeding problems.  I reviewed his lab work from October which is noted below.  We discussed getting an updated echocardiogram in February for follow-up of aortic stenosis.  Physical Exam: VS:  BP 128/62 (BP Location: Left Arm, Patient Position: Sitting, Cuff Size: Normal)   Pulse 82   Ht 5' 11 (1.803 m)   Wt 205 lb 3.2 oz (93.1 kg)   SpO2 95%   BMI 28.62 kg/m , BMI Body mass index is 28.62 kg/m.  Wt Readings from Last 3 Encounters:  06/08/24 205 lb 3.2 oz (93.1 kg)  04/01/24 203 lb (92.1 kg)  11/25/23 202 lb (91.6 kg)    General: Patient appears comfortable at rest. HEENT: Conjunctiva and lids normal. Neck: Supple, no elevated JVP or carotid bruits. Lungs: Clear to auscultation, nonlabored breathing at rest. Cardiac: Regular rate and rhythm, no S3, 2/6 systolic murmur. Extremities: No pitting edema.  ECG:  An ECG dated 11/22/2023 was personally reviewed today and demonstrated:  Sinus rhythm with nonspecific T wave abnormalities.  Labwork: 11/22/2023: ALT 38; AST 42; BUN 17; Creatinine, Ser 1.11; Hemoglobin 14.1; Platelets 143; Potassium 3.8; Sodium 136     Component Value Date/Time   CHOL 135 08/27/2023 0357   TRIG 378 (H) 08/27/2023 0357   HDL 23 (L) 08/27/2023 0357   CHOLHDL 5.9 08/27/2023 0357   VLDL 76 (H) 08/27/2023 0357   LDLCALC 36 08/27/2023 0357  October 2025: Hemoglobin 15.1, platelets 185, BUN 20,  creatinine 1.28, GFR 55, potassium 4.3, AST 36, ALT 38, cholesterol 140, triglycerides 318, HDL 27, LDL 63, TSH 1.03  Other Studies Reviewed Today:  No interval cardiac testing for review today.  Assessment and Plan:  1.  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  LVEF 60 to 65% echocardiogram in February.  No angina on current regimen.  He is not on aspirin  given use of Eliquis .  Continue statin therapy and observation.   2.  Degenerative calcific aortic stenosis.  Echocardiogram in February demonstrated paradoxical normal flow/low gradient aortic stenosis of moderate to severe degree with mean AV gradient 16.4 mmHg and dimensionless index 0.3.  No change in cardiac murmur.  Plan to update echocardiogram in February 2026.   3.  Ischemic stroke documented in February.  Potentially embolic, although no definite atrial fibrillation was uncovered during his workup including outpatient cardiac monitor.  He does have a prior history of postoperative atrial fibrillation however.  CHA2DS2-VASc score is 6.  He remains on Eliquis  5 mg twice daily for stroke prophylaxis, tolerating well.  I reviewed his lab work from October.  No palpitations.    4.  Mixed hyperlipidemia.  LDL 63 in October.  Continue Crestor  40 mg daily.   5.  Primary hypertension.  Blood pressure well-controlled today.  He  is currently on Toprol -XL 25 mg twice daily, Lotensin  40 mg daily, and Norvasc  10 mg daily.   6.  CKD stage IIIa.  Creatinine 1.28 with GFR 55 in October.  Disposition:  Follow up 6 months.  Signed, Carl Young, M.D., F.A.C.C.  [facility]

## 2024-06-08 NOTE — Patient Instructions (Addendum)
 Medication Instructions:  Your physician recommends that you continue on your current medications as directed. Please refer to the Current Medication list given to you today.  Labwork: none  Testing/Procedures: Your physician has requested that you have an echocardiogram in February 2026. Echocardiography is a painless test that uses sound waves to create images of your heart. It provides your doctor with information about the size and shape of your heart and how well your heart's chambers and valves are working. This procedure takes approximately one hour. There are no restrictions for this procedure. Please do NOT wear cologne, perfume, aftershave, or lotions (deodorant is allowed). Please arrive 15 minutes prior to your appointment time.  Please note: We ask at that you not bring children with you during ultrasound (echo/ vascular) testing. Due to room size and safety concerns, children are not allowed in the ultrasound rooms during exams. Our front office staff cannot provide observation of children in our lobby area while testing is being conducted. An adult accompanying a patient to their appointment will only be allowed in the ultrasound room at the discretion of the ultrasound technician under special circumstances. We apologize for any inconvenience.  Follow-Up: Your physician recommends that you schedule a follow-up appointment in: 6 months  Any Other Special Instructions Will Be Listed Below (If Applicable).  If you need a refill on your cardiac medications before your next appointment, please call your pharmacy.

## 2024-07-16 ENCOUNTER — Other Ambulatory Visit: Payer: Self-pay | Admitting: Cardiology

## 2024-08-01 ENCOUNTER — Other Ambulatory Visit: Payer: Self-pay | Admitting: Cardiology

## 2024-08-01 DIAGNOSIS — I480 Paroxysmal atrial fibrillation: Secondary | ICD-10-CM

## 2024-08-02 NOTE — Telephone Encounter (Signed)
 Eliquis  5mg  refill request received. Patient is 84 years old, weight-93.1kg, Crea-1.11 on 11/22/23, Diagnosis-Afib, and last seen by Dr. Debera on 06/08/24. Dose is appropriate based on dosing criteria. Will send in refill to requested pharmacy.

## 2024-08-23 ENCOUNTER — Ambulatory Visit
